# Patient Record
Sex: Female | Born: 1950 | Hispanic: No | Smoking: Never smoker
Health system: Southern US, Community
[De-identification: ages and names within clinical notes are randomized; demographics above are authoritative.]

## PROBLEM LIST (undated history)

## (undated) DIAGNOSIS — G709 Myoneural disorder, unspecified: Secondary | ICD-10-CM

## (undated) DIAGNOSIS — H538 Other visual disturbances: Secondary | ICD-10-CM

## (undated) DIAGNOSIS — E119 Type 2 diabetes mellitus without complications: Secondary | ICD-10-CM

## (undated) DIAGNOSIS — J4520 Mild intermittent asthma, uncomplicated: Secondary | ICD-10-CM

## (undated) DIAGNOSIS — E039 Hypothyroidism, unspecified: Secondary | ICD-10-CM

## (undated) DIAGNOSIS — I890 Lymphedema, not elsewhere classified: Secondary | ICD-10-CM

## (undated) DIAGNOSIS — M170 Bilateral primary osteoarthritis of knee: Secondary | ICD-10-CM

## (undated) DIAGNOSIS — I10 Essential (primary) hypertension: Secondary | ICD-10-CM

## (undated) DIAGNOSIS — Z87442 Personal history of urinary calculi: Secondary | ICD-10-CM

## (undated) DIAGNOSIS — E785 Hyperlipidemia, unspecified: Secondary | ICD-10-CM

## (undated) DIAGNOSIS — R0600 Dyspnea, unspecified: Secondary | ICD-10-CM

## (undated) HISTORY — PX: TUBAL LIGATION: SHX77

## (undated) HISTORY — PX: EYE SURGERY: SHX253

## (undated) HISTORY — PX: ABDOMINAL HYSTERECTOMY: SHX81

## (undated) HISTORY — DX: Mild intermittent asthma, uncomplicated: J45.20

## (undated) HISTORY — DX: Essential (primary) hypertension: I10

## (undated) HISTORY — DX: Hypothyroidism, unspecified: E03.9

## (undated) HISTORY — DX: Other visual disturbances: H53.8

## (undated) HISTORY — DX: Lymphedema, not elsewhere classified: I89.0

## (undated) HISTORY — DX: Type 2 diabetes mellitus without complications: E11.9

## (undated) HISTORY — DX: Bilateral primary osteoarthritis of knee: M17.0

## (undated) HISTORY — DX: Hyperlipidemia, unspecified: E78.5

---

## 2015-11-03 ENCOUNTER — Other Ambulatory Visit: Payer: Self-pay | Admitting: *Deleted

## 2015-11-03 ENCOUNTER — Encounter: Payer: Self-pay | Admitting: Vascular Surgery

## 2015-11-03 DIAGNOSIS — I890 Lymphedema, not elsewhere classified: Secondary | ICD-10-CM

## 2015-12-12 ENCOUNTER — Encounter: Payer: Self-pay | Admitting: Vascular Surgery

## 2015-12-16 ENCOUNTER — Ambulatory Visit (HOSPITAL_COMMUNITY)
Admission: RE | Admit: 2015-12-16 | Discharge: 2015-12-16 | Disposition: A | Discharge status: Home / Self Care | Payer: BLUE CROSS/BLUE SHIELD | Source: Ambulatory Visit | Attending: Vascular Surgery | Admitting: Vascular Surgery

## 2015-12-16 ENCOUNTER — Encounter: Payer: Self-pay | Admitting: Vascular Surgery

## 2015-12-16 ENCOUNTER — Ambulatory Visit (INDEPENDENT_AMBULATORY_CARE_PROVIDER_SITE_OTHER): Payer: BLUE CROSS/BLUE SHIELD | Admitting: Vascular Surgery

## 2015-12-16 DIAGNOSIS — I10 Essential (primary) hypertension: Secondary | ICD-10-CM | POA: Diagnosis not present

## 2015-12-16 DIAGNOSIS — I890 Lymphedema, not elsewhere classified: Secondary | ICD-10-CM | POA: Diagnosis not present

## 2015-12-16 DIAGNOSIS — R609 Edema, unspecified: Secondary | ICD-10-CM | POA: Diagnosis present

## 2015-12-16 DIAGNOSIS — E785 Hyperlipidemia, unspecified: Secondary | ICD-10-CM | POA: Diagnosis not present

## 2015-12-16 DIAGNOSIS — I83899 Varicose veins of unspecified lower extremities with other complications: Secondary | ICD-10-CM | POA: Insufficient documentation

## 2015-12-16 DIAGNOSIS — I872 Venous insufficiency (chronic) (peripheral): Secondary | ICD-10-CM | POA: Insufficient documentation

## 2015-12-16 NOTE — Progress Notes (Signed)
Referred by:  Cain Saupe, MD [address] [place], [place] [address]  Reason for referral: lymphedema   History of Present Illness  Jennifer Huffman is a 65 y.o. (28-May-1951) female who presents with chief complaint: lymphedema.  This patient has had known history of lymphedema for >5 years.  She recently transferred down from [place] and is here today to be established for management of her lymphedema.  She has had only one episode of cellulitis: in right leg.  She denies any wounds currently.   Past Medical History  Diagnosis Date  . Hypertension   . Hyperlipidemia   . Lymphedema of both lower extremities   . Osteoarthritis of both knees   . Asthma, mild intermittent   . Hypothyroidism   . Blurred vision, bilateral     Past Surgical History: denies any procedures  Social History   Social History  . Marital Status: Legally Separated    Spouse Name: N/A  . Number of Children: N/A  . Years of Education: N/A   Occupational History  . Not on file.   Social History Main Topics  . Smoking status: Never Smoker   . Smokeless tobacco: Never Used  . Alcohol Use: No  . Drug Use: No  . Sexual Activity: Not on file   Other Topics Concern  . Not on file   Social History Narrative    Family History  Problem Relation Age of Onset  . Cancer Mother     breast  . Hypertension Father     Current Outpatient Prescriptions  Medication Sig Dispense Refill  . albuterol (PROVENTIL HFA;VENTOLIN HFA) 108 (90 Base) MCG/ACT inhaler Inhale 2 puffs into the lungs every 4 (four) hours as needed for wheezing or shortness of breath.    . AMLODIPINE BESYLATE PO Take 5 mg by mouth daily.    Marland Kitchen aspirin 81 MG tablet Take 81 mg by mouth daily.    Marland Kitchen atorvastatin (LIPITOR) 10 MG tablet Take 10 mg by mouth daily.    . Fluticasone-Salmeterol (ADVAIR) 250-50 MCG/DOSE AEPB Inhale 1 puff into the lungs 2 (two) times daily.    . furosemide (LASIX) 40 MG tablet Take 40 mg by mouth daily.      Marland Kitchen LEVOTHYROXINE SODIUM PO Take 150 mcg by mouth daily.    . Multiple Vitamins-Minerals (MULTIVITAMIN WITH MINERALS) tablet Take 1 tablet by mouth daily.    . potassium chloride (KLOR-CON 10) 10 MEQ tablet Take 10 mEq by mouth 2 (two) times daily.    Marland Kitchen telmisartan (MICARDIS) 80 MG tablet Take 80 mg by mouth daily.    . traMADol (ULTRAM) 50 MG tablet Take 50 mg by mouth every 6 (six) hours as needed.     No current facility-administered medications for this visit.    No Known Allergies   REVIEW OF SYSTEMS:  (Positives checked otherwise negative)  CARDIOVASCULAR:    chest pain,   chest pressure,   palpitations,   shortness of breath when laying flat,   shortness of breath with exertion,    pain in feet when walking,   pain in feet when laying flat,  history of blood clot in veins (DVT),   history of phlebitis,   swelling in legs,   varicose veins  PULMONARY:    productive cough,   asthma,   wheezing  NEUROLOGIC:    weakness in arms or legs,   numbness in arms or legs,    difficulty speaking or slurred speech,  [ ]  temporary loss of vision in one eye,  [ ]  dizziness  HEMATOLOGIC:   [ ]  bleeding problems,  [ ]  problems with blood clotting too easily  MUSCULOSKEL:   [ ]  joint pain, [ ]  joint swelling  GASTROINTEST:   [ ]  vomiting blood,  [ ]  blood in stool     GENITOURINARY:   [ ]  burning with urination,  [ ]  blood in urine  PSYCHIATRIC:   [ ]  history of major depression  INTEGUMENTARY:   [x]  rashes,  [ ]  ulcers  CONSTITUTIONAL:   [ ]  fever,  [ ]  chills   Physical Examination  Filed Vitals:   12/16/15 1421 12/16/15 1432  BP: 163/63 165/60  Pulse: 59   Height: 5\' 3"  (1.6 m)   Weight: 320 lb 1.6 oz (145.196 kg)   SpO2: 96%    Body mass index is 56.72 kg/(m^2).  General: A&O x 3, WD, morbidly obese  Head: [place]/AT  Ear/Nose/Throat: Hearing grossly intact, nares without erythema or drainage,  oropharynx without Erythema/Exudate, Mallampati score: 3  Eyes: PERRLA, EOMI  Neck: Supple, no nuchal rigidity, no palpable LAD  Pulmonary: Sym exp, good air movt, CTAB, no rales, rhonchi, & wheezing  Cardiac: RRR, Nl S1, S2, no Murmurs, rubs or gallops  Vascular: Vessel Right Left  Radial Faintly Palpable Faintly Palpable  Brachial Palpable Palpable  Carotid Palpable, without bruit Palpable, without bruit  Aorta Not palpable due to pannus N/A  Femoral Not palpable due to pannus Not palpable due to pannus  Popliteal Not palpable Not palpable  PT Not Palpable Not Palpable  DP Not Palpable Not Palpable   Gastrointestinal: soft, NTND, no G/R, no HSM, no masses, no CVAT B  Musculoskeletal: M/S 5/5 throughout , Extremities without ischemic changes , no LDS, lipedema fat distribution, no woody edema, somewhat echymotic skin in right calf skin fold  Neurologic: CN 2-12 intact , Pain and light touch intact in extremities , Motor exam as listed above  Psychiatric: Judgment intact, Mood & affect appropriate for pt's clinical situation  Dermatologic: See M/S exam for extremity exam, no rashes otherwise noted  Lymph : No Cervical, Axillary, or Inguinal lymphadenopathy    Non-Invasive Vascular Imaging  BLE Venous Insufficiency Duplex (Date: 12/16/2015):   Essentially non-diagnostic due to body habitus   Outside Studies/Documentation 5 pages of outside documents were reviewed including: PCP chart.   Medical Decision Making  Jennifer Huffman is a 65 y.o. female who presents with: lipedema with superimposed with reportedly lymphedema   Pt's legs do not demonstrate sequalae from long-term lymphedema, so either she does not have lymphedema or she has very well controlled lymphedema.  I don't think lymphoscintigraphy is indicated to make a formal diagnosis.   Based on the patient's history and examination, I recommend: referral to Lymphedema Clinic for continued lymphatic massage,  sequential compression therapy, and compression.  Thank you for allowing us to participate in this patient's care.   Leonides SakeBrian Thailan Sava, MD [facility] Office: [phone] Pager: [phone]  12/16/2015, 8:51 AM

## 2016-02-20 ENCOUNTER — Ambulatory Visit (INDEPENDENT_AMBULATORY_CARE_PROVIDER_SITE_OTHER): Payer: BLUE CROSS/BLUE SHIELD | Admitting: Internal Medicine

## 2016-02-20 ENCOUNTER — Encounter: Payer: Self-pay | Admitting: Internal Medicine

## 2016-02-20 ENCOUNTER — Other Ambulatory Visit (INDEPENDENT_AMBULATORY_CARE_PROVIDER_SITE_OTHER): Payer: BLUE CROSS/BLUE SHIELD | Admitting: *Deleted

## 2016-02-20 VITALS — BP 124/80 | HR 90 | Ht 61.0 in | Wt 323.4 lb

## 2016-02-20 DIAGNOSIS — R7303 Prediabetes: Secondary | ICD-10-CM

## 2016-02-20 LAB — POCT GLYCOSYLATED HEMOGLOBIN (HGB A1C): Hemoglobin A1C: 6.3

## 2016-02-20 NOTE — Progress Notes (Signed)
Patient ID: Jennifer Huffman, female   DOB: 23-Aug-1951, 65 y.o.   MRN: [id number]  HPI: Jennifer Huffman is a 65 y.o.-year-old female, referred by her PCP, Dr. Jillyn HiddenFulp, for management of prediabetes, dx in ~2014, diet-controlled uncontrolled, without complications. She moved from [place] in 08/2015.  Last hemoglobin A1c was: 10/31/2015: HbA1c 6.5%  Pt is not on meds for her prediabetes.  Pt does not check her sugars at home.  Glucometer: none  Pt's meals are: - Brunch: bacon + eggs + grits, eggs + sausage - Dinner: Cong[place]inese foods or meat (fried or baked) + veggies + starch  - Snacks: 0-1, after dinner >> icecream or apple or potato chips Diet sodas - 1x a day, or water + Coolaid  - no CKD, last BUN/creatinine:  10/31/2015: 16/0.87, Glucose 113 She is on telmisartan 80 mg daily. - last set of lipids: 10/31/2015: 175/82/50/104 She is on atorvastatin 10 mg daily. - last eye exam was "long time ago". No DR.  - no numbness and tingling in her feet. She takes an aspirin 81 mg daily  Pt has FH of prediabetes: sisters.  She has a history of hyperlipidemia, hypertension, lymphedema, osteoarthritis of knees, mild intermittent asthma, eczema.  She also has a history of hypothyroidism. She is on levothyroxine 150 g daily. Last TSH level: 10/31/2015: TSH 4.36 (0.34-4.5)  ROS: Constitutional: + weight gain, no fatigue, + hot flushes, + poor sleep Eyes: no blurry vision, no xerophthalmia ENT: no sore throat, no nodules palpated in throat, no dysphagia/odynophagia, no hoarseness Cardiovascular: no CP/+ SOB/no palpitations/+ leg swelling Respiratory: + cough/+ SOB/+ wheezing Gastrointestinal: no N/V/D/C Musculoskeletal: no muscle/+ joint aches Skin: + rash, + easy bruising, + itching Neurological: no tremors/numbness/tingling/dizziness, + HA Psychiatric: no depression/anxiety  Past Medical History  Diagnosis Date  . Hypertension   . Hyperlipidemia   . Lymphedema of both lower  extremities   . Osteoarthritis of both knees   . Asthma, mild intermittent   . Hypothyroidism   . Blurred vision, bilateral    No past surgical history on file. Social History   Social History  . Marital Status: Legally Separated    Spouse Name: N/A  . Number of Children: 2   Occupational History  . retired   Social History Main Topics  . Smoking status: Never Smoker   . Smokeless tobacco: Never Used  . Alcohol Use: Vodka 1-2 drinks socially  . Drug Use: No   Current Outpatient Prescriptions on File Prior to Visit  Medication Sig Dispense Refill  . albuterol (PROVENTIL HFA;VENTOLIN HFA) 108 (90 Base) MCG/ACT inhaler Inhale 2 puffs into the lungs every 4 (four) hours as needed for wheezing or shortness of breath.    . AMLODIPINE BESYLATE PO Take 5 mg by mouth daily.    Marland Kitchen. aspirin 81 MG tablet Take 81 mg by mouth daily.    . Fluticasone-Salmeterol (ADVAIR) 250-50 MCG/DOSE AEPB Inhale 1 puff into the lungs 2 (two) times daily.    . furosemide (LASIX) 40 MG tablet Take 40 mg by mouth daily.    Marland Kitchen. LEVOTHYROXINE SODIUM PO Take 150 mcg by mouth daily.    . Multiple Vitamins-Minerals (MULTIVITAMIN WITH MINERALS) tablet Take 1 tablet by mouth daily.    . potassium chloride (KLOR-CON 10) 10 MEQ tablet Take 10 mEq by mouth 2 (two) times daily.    Marland Kitchen. telmisartan (MICARDIS) 80 MG tablet Take 80 mg by mouth daily.    . traMADol (ULTRAM) 50 MG tablet Take 50 mg by mouth every  6 (six) hours as needed.     No current facility-administered medications on file prior to visit.   No Known Allergies Family History  Problem Relation Age of Onset  . Cancer Mother     breast  . Hypertension Father    PE: BP 124/80 mmHg  Pulse 90  Ht  (1.549 m)  Wt 323 lb 6.4 oz (146.693 kg)  BMI 61.14 kg/m2  SpO2 95% Wt Readings from Last 3 Encounters:  02/20/16 323 lb 6.4 oz (146.693 kg)  12/16/15 320 lb 1.6 oz (145.196 kg)   Constitutional: Obese, in NAD Eyes: PERRLA, EOMI, no exophthalmos ENT:  moist mucous membranes, no thyromegaly, no cervical lymphadenopathy Cardiovascular: RRR, No MRG, + lower extremity lymphedema Respiratory: CTA B Gastrointestinal: abdomen soft, NT, ND, BS+ Musculoskeletal: no deformities, strength intact in all 4 Skin: moist, warm, no rashes Neurological: no tremor with outstretched hands, DTR normal in all 4  ASSESSMENT: 1. Prediabetes  PLAN:  1. Patient with history of prediabetes, with a recent HbA1c in the low diabetic range, at 6.5%. We checked a new HbA1c today >> 6.3%. Will still consider her prediabetic. - we discussed about prediabetes vs diabetes dx - we discussed about possible complications and HbA1c targets - we also discussed about the importance of diet and staying active. I advised her that a weight loss of 5-8% would be great to reverse the metabolic dysfunction. I will refer her to nutrition.  - no other tx needed for now - I suggested to:  Patient Instructions  Please schedule an appt with Oran Rein with nutrition.  Please work on diet and increasing activity and start checking sugars once a day. Bring sugars at next visit.   Please come back for a follow-up appointment in 3 months with your log.  - Strongly advised her to start checking sugars at different times of the day - check once a day, rotating checks - given her a meter and demonstrated use - given sugar log and advised how to fill it and to bring it at next appt  - given foot care handout and explained the principles  - given instructions for hypoglycemia management "15-15 rule"  - advised for yearly eye exams  >. She will need to establish care in [place] - Return to clinic in 3 mo with sugar log

## 2016-02-20 NOTE — Patient Instructions (Signed)
Please schedule an appt with LaurOran Reinwith nutrition.  Please work on diet and increasing activity and start checking sugars once a day. Bring sugars at next visit.   Please come back for a follow-up appointment in 3 months with your log.  PATIENT INSTRUCTIONS FOR TYPE 2 DIABETES OR PREDIABETES:  **Please join MyChart!** - see attached instructions about how to join if you have not done so already.  DIET AND EXERCISE Diet and exercise is an important part of diabetic treatment.  We recommended aerobic exercise in the form of brisk walking (working between 40-60% of maximal aerobic capacity, similar to brisk walking) for 150 minutes per week (such as 30 minutes five days per week) along with 3 times per week performing 'resistance' training (using various gauge rubber tubes with handles) 5-10 exercises involving the major muscle groups (upper body, lower body and core) performing 10-15 repetitions (or near fatigue) each exercise. Start at half the above goal but build slowly to reach the above goals. If limited by weight, joint pain, or disability, we recommend daily walking in a swimming pool with water up to waist to reduce pressure from joints while allow for adequate exercise.    BLOOD GLUCOSES Monitoring your blood glucoses is important for continued management of your diabetes. Please check your blood glucoses 2-4 times a day: fasting, before meals and at bedtime (you can rotate these measurements - e.g. one day check before the 3 meals, the next day check before 2 of the meals and before bedtime, etc.).   HYPOGLYCEMIA (low blood sugar) Hypoglycemia is usually a reaction to not eating, exercising, or taking too much insulin/ other diabetes drugs.  Symptoms include tremors, sweating, hunger, confusion, headache, etc. Treat IMMEDIATELY with 15 grams of Carbs: . 4 glucose tablets .  cup regular juice/soda . 2 tablespoons raisins . 4 teaspoons sugar . 1 tablespoon honey Recheck blood  glucose in 15 mins and repeat above if still symptomatic/blood glucose <100.  RECOMMENDATIONS TO REDUCE YOUR RISK OF DIABETIC COMPLICATIONS: * Take your prescribed MEDICATION(S) * Follow a DIABETIC diet: Complex carbs, fiber rich foods, (monounsaturated and polyunsaturated) fats * AVOID saturated/trans fats, high fat foods, >2,300 mg salt per day. * EXERCISE at least 5 times a week for 30 minutes or preferably daily.  * DO NOT SMOKE OR DRINK more than 1 drink a day. * Check your FEET every day. Do not wear tightfitting shoes. Contact us if you develop an ulcer * See your EYE doctor once a year or more if needed * Get a FLU shot once a year * Get a PNEUMONIA vaccine once before and once after age 57 years  GOALS:  * Your Hemoglobin A1c of <7%  * fasting sugars need to be <130 * after meals sugars need to be <180 (2h after you start eating) * Your Systolic BP should be 140 or lower  * Your Diastolic BP should be 80 or lower  * Your HDL (Good Cholesterol) should be 40 or higher  * Your LDL (Bad Cholesterol) should be 100 or lower. * Your Triglycerides should be 150 or lower  * Your Urine microalbumin (kidney function) should be <30 * Your Body Mass Index should be 25 or lower    Please consider the following ways to cut down carbs and fat and increase fiber and micronutrients in your diet: - substitute whole grain for white bread or pasta - substitute brown rice for white rice - substitute 90-calorie flat bread pieces for slices  of bread when possible - substitute sweet potatoes or yams for white potatoes - substitute humus for margarine - substitute tofu for cheese when possible - substitute almond or rice milk for regular milk (would not drink soy milk daily due to concern for soy estrogen influence on breast cancer risk) - substitute dark chocolate for other sweets when possible - substitute water - can add lemon or orange slices for taste - for diet sodas (artificial sweeteners  will trick your body that you can eat sweets without getting calories and will lead you to overeating and weight gain in the long run) - do not skip breakfast or other meals (this will slow down the metabolism and will result in more weight gain over time)  - can try smoothies made from fruit and almond/rice milk in am instead of regular breakfast - can also try old-fashioned (not instant) oatmeal made with almond/rice milk in am - order the dressing on the side when eating salad at a restaurant (pour less than half of the dressing on the salad) - eat as little meat as possible - can try juicing, but should not forget that juicing will get rid of the fiber, so would alternate with eating raw veg./fruits or drinking smoothies - use as little oil as possible, even when using olive oil - can dress a salad with a mix of balsamic vinegar and lemon juice, for e.g. - use agave nectar, stevia sugar, or regular sugar rather than artificial sweateners - steam or broil/roast veggies  - snack on veggies/fruit/nuts (unsalted, preferably) when possible, rather than processed foods - reduce or eliminate aspartame in diet (it is in diet sodas, chewing gum, etc) Read the labels!  Try to read Dr. Katherina RightNeal Barnard's book: "Program for Reversing Diabetes" for other ideas for healthy eating.

## 2016-04-12 ENCOUNTER — Encounter: Payer: BLUE CROSS/BLUE SHIELD | Admitting: Dietician

## 2016-04-27 ENCOUNTER — Ambulatory Visit (HOSPITAL_COMMUNITY)
Admission: EM | Admit: 2016-04-27 | Discharge: 2016-04-27 | Disposition: A | Discharge status: Home / Self Care | Payer: BLUE CROSS/BLUE SHIELD | Attending: Emergency Medicine | Admitting: Emergency Medicine

## 2016-04-27 ENCOUNTER — Encounter (HOSPITAL_COMMUNITY): Payer: Self-pay | Admitting: Emergency Medicine

## 2016-04-27 DIAGNOSIS — M545 Low back pain, unspecified: Secondary | ICD-10-CM

## 2016-04-27 LAB — POCT URINALYSIS DIP (DEVICE)
BILIRUBIN URINE: NEGATIVE
Glucose, UA: NEGATIVE mg/dL
Hgb urine dipstick: NEGATIVE
KETONES UR: NEGATIVE mg/dL
Leukocytes, UA: NEGATIVE
Nitrite: NEGATIVE
PH: 6 (ref 5.0–8.0)
Protein, ur: NEGATIVE mg/dL
Specific Gravity, Urine: <=1.005 (ref 1.005–1.030)
Urobilinogen, UA: 0.2 mg/dL (ref 0.0–1.0)

## 2016-04-27 MED ORDER — TRAMADOL HCL 50 MG PO TABS: 50.0000 mg | ORAL_TABLET | Freq: Four times a day (QID) | ORAL | 0 refills | Status: DC | PRN

## 2016-04-27 NOTE — Discharge Instructions (Signed)
Take the NSAID on a regular basis for the next 10 days. tramadol for severe pain only. many people find gentle stretching and deep tissue massage helpful. Try [facility] on [address]. They have very reasonable prices and take walk ins. Or you can go to  [facility]. Follow-up with your primary care physician in several days, go to the ER for the signs and symptoms we discussed. °

## 2016-04-27 NOTE — ED Triage Notes (Signed)
C/o right side back pain which started 2 hours ago States she is urinating frequently States she has a bitter taste in her mouth  Mouth is dry

## 2016-04-27 NOTE — ED Provider Notes (Signed)
HPI  SUBJECTIVE:  Jennifer Huffman is a 65 y.o. female who presents with  intermittent right-sided mid back pain that she describes as stabbing, lasting several minutes and then resolving. States this started this morning, several hours prior to arrival. It does not radiate down her flank or to her abdomen. She was started on antibiotics for UTI yesterday. She has tried tramadol for this which have helped her symptoms. Symptoms are worse with large positional changes. She denies trauma to her back, nausea, vomiting, fevers, abdominal pain, dysuria, urinary urgency, frequency, cloudy or odorous urine, hematuria. No urinary or fecal incontinence. No syncope. Denies any radicular pain, saddle anesthesia, weakness in her right leg. She reports some lower abdominal pain/pressure with urinating which she has had for the past 10 days, which she states is getting better today after starting the antibiotics yesterday. She has a past medical history of obesity, osteo- arthritis, hypertension, UTI, borderline diabetes, hypothyroidism, asthma. No history of nephrolithiasis pyelonephritis, gallbladder disease, cancer, multiple myeloma. Family history negative for nephrolithiasis.     Past Medical History:  Diagnosis Date  . Asthma, mild intermittent   . Blurred vision, bilateral   . Hyperlipidemia   . Hypertension   . Hypothyroidism   . Lymphedema of both lower extremities   . Osteoarthritis of both knees     History reviewed. No pertinent surgical history.  Family History  Problem Relation Age of Onset  . Cancer Mother     breast  . Hypertension Father     Social History  Substance Use Topics  . Smoking status: Never Smoker  . Smokeless tobacco: Never Used  . Alcohol use No    No current facility-administered medications for this encounter.   Current Outpatient Prescriptions:  .  albuterol (PROVENTIL HFA;VENTOLIN HFA) 108 (90 Base) MCG/ACT inhaler, Inhale 2 puffs into the lungs every 4  (four) hours as needed for wheezing or shortness of breath., Disp: , Rfl:  .  AMLODIPINE BESYLATE PO, Take 5 mg by mouth daily., Disp: , Rfl:  .  aspirin 81 MG tablet, Take 81 mg by mouth daily., Disp: , Rfl:  .  Fluticasone-Salmeterol (ADVAIR) 250-50 MCG/DOSE AEPB, Inhale 1 puff into the lungs 2 (two) times daily., Disp: , Rfl:  .  furosemide (LASIX) 40 MG tablet, Take 40 mg by mouth daily., Disp: , Rfl:  .  LEVOTHYROXINE SODIUM PO, Take 150 mcg by mouth daily., Disp: , Rfl:  .  Multiple Vitamins-Minerals (MULTIVITAMIN WITH MINERALS) tablet, Take 1 tablet by mouth daily., Disp: , Rfl:  .  potassium chloride (KLOR-CON 10) 10 MEQ tablet, Take 10 mEq by mouth 2 (two) times daily., Disp: , Rfl:  .  telmisartan (MICARDIS) 80 MG tablet, Take 80 mg by mouth daily., Disp: , Rfl:  .  traMADol (ULTRAM) 50 MG tablet, Take 1 tablet (50 mg total) by mouth every 6 (six) hours as needed., Disp: 30 tablet, Rfl: 0  No Known Allergies   ROS  As noted in HPI.   Physical Exam  BP 158/94 (BP Location: Left Wrist)   Pulse 72   Temp 98.2 F (36.8 C) (Oral)   SpO2 95%   Constitutional: Well developed, well nourished, no acute distress Eyes:  EOMI, conjunctiva normal bilaterally HENT: Normocephalic, atraumatic,mucus membranes moist Respiratory: Normal inspiratory effort Cardiovascular: Normal rate GI: nondistended. No suprapubic tenderness skin: No rash, skin intact Musculoskeletal: no CVAT. + R paralumbar tenderness,  - muscle spasm. No bony tenderness. Bilateral lower extremities nontender without new rashes or color  change, baseline ROM. Pain aggravated with active right hip flexion against resistance, torso rotation, bending forward.  Sensation baseline light touch bilaterally for Pt, DTR's symmetric and intact bilaterally KJ, Motor symmetric bilateral 5/5 hip flexion, quadriceps, hamstrings, EHL, foot dorsiflexion, foot plantarflexion, gait without apparent new ataxia. Neurologic: Alert & oriented x  3, no focal neuro deficits Psychiatric: Speech and behavior appropriate   ED Course   Medications - No data to display  Orders Placed This Encounter  Procedures  . POCT urinalysis dip (device)    Standing Status:   Standing    Number of Occurrences:   1    Results for orders placed or performed during the hospital encounter of 04/27/16 (from the past 24 hour(s))  POCT urinalysis dip (device)     Status: None   Collection Time: 04/27/16 10:44 AM  Result Value Ref Range   Glucose, UA NEGATIVE NEGATIVE mg/dL   Bilirubin Urine NEGATIVE NEGATIVE   Ketones, ur NEGATIVE NEGATIVE mg/dL   Specific Gravity, Urine <=1.005 1.005 - 1.030   Hgb urine dipstick NEGATIVE NEGATIVE   pH 6.0 5.0 - 8.0   Protein, ur NEGATIVE NEGATIVE mg/dL   Urobilinogen, UA 0.2 0.0 - 1.0 mg/dL   Nitrite NEGATIVE NEGATIVE   Leukocytes, UA NEGATIVE NEGATIVE   No results found.  ED Clinical Impression  Right-sided low back pain without sciatica   ED Assessment/Plan  [place] narcotic database reviewed. Pt with no narcotic rx since 5/23 when she was prescribed tramadol 50 mg #60, 15 day supply.   UA negative for UTI, hemoglobin, ketones.  No evidence of spinal cord involvement based on H&P. Pain present since this am,  been < 6 week duration. No historical red flags as noted in HPI. No physical red flags such as fever, bony tenderness, lower extremity weakness, saddle anesthesia. Imaging not indicated at this time.   Presentation most consistent with musculoskeletal back pain. She is very deconditioned.  She has no hematuria, doubt nephrolithiasis. She has no CVAT, flank tenderness, suprapubic tenderness and her urine is normal today, so doubt pyelonephritis. No gallbladder tenderness, doubt gallbladder disease. She is already on antibiotics for UTI, advised her to continue these. Doubt AAA.  Patient states that she has plenty of Aleve at home and does not need a prescription, advised 2 tabs twice a day. We'll refill  tramadol. Advised deep tissue massage, follow-up with PMD as needed.   Discussed labs,  MDM, plan and followup with patient . Discussed sn/sx that should prompt return to the  ED. Patient  agrees with plan.   *This clinic note was created using Dragon dictation software. Therefore, there may be occasional mistakes despite careful proofreading.  ?    Melynda Ripple, MD 04/27/16 2140

## 2016-05-15 ENCOUNTER — Encounter: Payer: BLUE CROSS/BLUE SHIELD | Attending: Internal Medicine | Admitting: Dietician

## 2016-05-15 ENCOUNTER — Encounter: Payer: Self-pay | Admitting: Dietician

## 2016-05-15 DIAGNOSIS — R7303 Prediabetes: Secondary | ICD-10-CM | POA: Diagnosis not present

## 2016-05-15 DIAGNOSIS — Z713 Dietary counseling and surveillance: Secondary | ICD-10-CM | POA: Insufficient documentation

## 2016-05-15 NOTE — Patient Instructions (Addendum)
Enquire about [facility] Sne[facility] program at the [facility] [phone]) or check the pool at the [facility] [phone]). Start by avoiding adding extra dressing/mayo with meals.  Choose mindfully, eat slowly away from the TV, stop when you are satisfied. Work at identifying when you are eating because of stress, boredom, depression etc.  Plan:  Aim for 2-3 Carb Choices per meal (30-45 grams) +/- 1 either way  Aim for 0-1 Carbs per snack if hungry  Include protein in moderation with your meals and snacks Consider reading food labels for Total Carbohydrate and Fat Grams of foods Consider  increasing your activity level by water exercise or armchair exercise for 15 minutes daily as tolerated

## 2016-05-15 NOTE — Progress Notes (Signed)
  Medical Nutrition Therapy:  Appt start time: 1400 end time:  1515.   Assessment:  Primary concerns today: Patient is here today with a friend and would like to learn how to lose weight.  Referral for prediabetes.  History includes hyperlipidemia HTN, and hypothyroidism.  Her last HgbA1C was 6.3%.  She has lymphedema in both legs.  She also has knee problems but does not qualify for surgery due to the lymphedema.    Weight hx: Lowest adult weight 185 lbs > 3 years ago and was getting shots and diet pills for weight loss during that time.  But she stopped going to the MD for this due to the expense. Highest adult weight 322 lbs which is today's weight.  Patient lives with her friend that is present today and her sister.  Her friend shops and cooks.  Retired from [employer] and moved from [place].   Preferred Learning Style:   No preference indicated   Learning Readiness:   Ready  MEDICATIONS: see list   DIETARY INTAKE:  Usual eating pattern includes 2 meals and 2 snacks per day.  24-hr recall:  B (9 AM): smoked Malawiturkey and mayo sandwich on white OR frosted flakes and whole milk OR bacon, eggs, sausage, grits, toast 3 times per week. Snk ( AM): granola bar when she has them and coffee with cream and 3 heaping teaspoons of sugar and fruit L ( PM): none Snk ( PM): none D (7 PM): hamburgers without bread, cabbage OR chicken wings (baked or fried) and vegetable Snk ( PM): none or ice cream Beverages: diet pepsi, water with crystal lite  Usual physical activity: ADL's  Estimated energy needs: 1800  calories 200 g carbohydrates 135 g protein 50 g fat  Progress Towards Goal(s):  In progress.   Nutritional Diagnosis:  NB-1.1 Food and nutrition-related knowledge deficit As related to balance of carbohydrates, protein, and fat.  As evidenced by diet hx and patient report.    Intervention:  Nutrition counseling and diabetes education initiated. Discussed Carb Counting by food  group as method of portion control, reading food labels, and benefits of increased activity. Also discussed basic physiology of Diabetes, target BG ranges pre and post meals, and A1c. Discussed changing cooking techniques, and basic meal planning.  Discussed emotional eating.  Enquire about [facility] S[facility] at [facility] [phone]) or check the pool at the [facility] [phone]). Start by avoiding adding extra dressing/mayo with meals.  Choose mindfully, eat slowly away from the TV, stop when you are satisfied. Work at identifying when you are eating because of stress, boredom, depression etc.  Plan:  Aim for 2-3 Carb Choices per meal (30-45 grams) +/- 1 either way  Aim for 0-1 Carbs per snack if hungry  Include protein in moderation with your meals and snacks Consider reading food labels for Total Carbohydrate and Fat Grams of foods Consider  increasing your activity level by water exercise or armchair exercise for 15 minutes daily as tolerated  Teaching Method Utilized:  Visual Auditory Hands on  Handouts given during visit include: Living Well with Diabetes Carb Counting and Food Label handouts Meal Plan Card  Snack list  Label reading  A1C sheet  Barriers to learning/adherence to lifestyle change: physical problems make exercise difficult  Demonstrated degree of understanding via:  Teach Back   Monitoring/Evaluation:  Dietary intake, exercise, label reading, and body weight prn.

## 2016-05-22 ENCOUNTER — Encounter: Payer: Self-pay | Admitting: Internal Medicine

## 2016-05-22 ENCOUNTER — Other Ambulatory Visit: Payer: Self-pay

## 2016-05-22 ENCOUNTER — Ambulatory Visit (INDEPENDENT_AMBULATORY_CARE_PROVIDER_SITE_OTHER): Payer: BLUE CROSS/BLUE SHIELD | Admitting: Internal Medicine

## 2016-05-22 VITALS — BP 120/74 | HR 71 | Ht 60.5 in | Wt 319.0 lb

## 2016-05-22 DIAGNOSIS — R7303 Prediabetes: Secondary | ICD-10-CM | POA: Diagnosis not present

## 2016-05-22 LAB — POCT GLYCOSYLATED HEMOGLOBIN (HGB A1C): HEMOGLOBIN A1C: 6.3

## 2016-05-22 MED ORDER — BAYER MICROLET LANCETS MISC: 5 refills | Status: AC

## 2016-05-22 MED ORDER — GLUCOSE BLOOD VI STRP: ORAL_STRIP | 5 refills | Status: DC

## 2016-05-22 NOTE — Patient Instructions (Signed)
Patient Instructions  Please continue to work on diet and increasing activity and start checking sugars once a day. Bring sugars at next visit.   Please come back for a follow-up appointment in 3 months with your log.

## 2016-05-22 NOTE — Progress Notes (Signed)
Patient ID: Jennifer Huffman, female   DOB: Mar 02, 1951, 65 y.o.   MRN: [id number]  HPI: Jennifer Huffman is a 65 y.o.-year-old female, returning for f/u for prediabetes, dx in ~2014, diet-controlled uncontrolled, without complications. [place] moved from [place] in 08/2015. Last visit 3 mo ago.  Last hemoglobin A1c was: Lab Results  Component Value Date   HGBA1C 6.3 02/20/2016  10/31/2015: HbA1c 6.5%  Pt is not on meds for her prediabetes.  Pt did started to check sugars at home - only checked 2-3: 172, 175 - before meals.  Glucometer: [employer] Technology[employer]ayer Contour Next one - we gave her this at last visit, could   Pt's meals are: - Brunch: bacon + eggs + grits, eggs + sausage - Dinner: Congohinese foods or meat (fried or baked) + veggies + starch  - Snacks: 0-1, after dinner >> icecream or apple or potato chips Diet sodas - 1x a day, or water + Coolaid  - no CKD, last BUN/creatinine:  10/31/2015: 16/0.87, Glucose 113 She is on telmisartan 80 mg daily. - last set of lipids: 10/31/2015: 175/82/50/104 She is on atorvastatin 10 mg daily. - last eye exam was "long time ago". No DR.  - no numbness and tingling in her feet. She takes an aspirin 81 mg daily  Pt has FH of prediabetes: sisters.  She has a history of hyperlipidemia, hypertension, lymphedema, osteoarthritis of knees, mild intermittent asthma, eczema.  She also has a history of hypothyroidism. She is on levothyroxine 150 g daily. Last TSH level: 10/31/2015: TSH 4.36 (0.34-4.5)  ROS: Constitutional: + weight loss, no fatigue, + hot flushes, + poor sleep Eyes: no blurry vision, no xerophthalmia ENT: no sore throat, no nodules palpated in throat, no dysphagia/odynophagia, no hoarseness Cardiovascular: no CP/+ SOB/no palpitations/+ leg swelling Respiratory: + cough/+ SOB/+ wheezing Gastrointestinal: no N/V/D/C Musculoskeletal: no muscle/+ joint aches Skin: + rash Neurological: no tremors/numbness/tingling/dizziness  I reviewed pt's  medications, allergies, PMH, social hx, family hx, and changes were documented in the history of present illness. Otherwise, unchanged from my initial visit note.  Past Medical History:  Diagnosis Date  . Asthma, mild intermittent   . Blurred vision, bilateral   . Hyperlipidemia   . Hypertension   . Hypothyroidism   . Lymphedema of both lower extremities   . Osteoarthritis of both knees    No past surgical history on file. Social History   Social History  . Marital Status: Legally Separated    Spouse Name: N/A  . Number of Children: 2   Occupational History  . retired   Social History Main Topics  . Smoking status: Never Smoker   . Smokeless tobacco: Never Used  . Alcohol Use: Vodka 1-2 drinks socially  . Drug Use: No   Current Outpatient Prescriptions on File Prior to Visit  Medication Sig Dispense Refill  . albuterol (PROVENTIL HFA;VENTOLIN HFA) 108 (90 Base) MCG/ACT inhaler Inhale 2 puffs into the lungs every 4 (four) hours as needed for wheezing or shortness of breath.    . AMLODIPINE BESYLATE PO Take 5 mg by mouth daily.    Marland Kitchen. aspirin 81 MG tablet Take 81 mg by mouth daily.    . Fluticasone-Salmeterol (ADVAIR) 250-50 MCG/DOSE AEPB Inhale 1 puff into the lungs 2 (two) times daily.    . furosemide (LASIX) 40 MG tablet Take 40 mg by mouth daily.    Marland Kitchen. LEVOTHYROXINE SODIUM PO Take 150 mcg by mouth daily.    . Multiple Vitamins-Minerals (MULTIVITAMIN WITH MINERALS) tablet Take 1 tablet by  mouth daily.    . potassium chloride (KLOR-CON 10) 10 MEQ tablet Take 10 mEq by mouth 2 (two) times daily.    Marland Kitchen. telmisartan (MICARDIS) 80 MG tablet Take 80 mg by mouth daily.    . traMADol (ULTRAM) 50 MG tablet Take 1 tablet (50 mg total) by mouth every 6 (six) hours as needed. 30 tablet 0   No current facility-administered medications on file prior to visit.    No Known Allergies Family History  Problem Relation Age of Onset  . Cancer Mother     breast  . Hypertension Father     PE: BP 120/74 (BP Location: Right Arm, Patient Position: Sitting, Cuff Size: Large)   Pulse 71   Ht 5' 0.5" (1.537 m)   Wt (!) 319 lb (144.7 kg)   SpO2 95%   BMI 61.27 kg/m  Wt Readings from Last 3 Encounters:  05/22/16 (!) 319 lb (144.7 kg)  05/15/16 (!) 322 lb (146.1 kg)  02/20/16 (!) 323 lb 6.4 oz (146.7 kg)   Constitutional: Obese, in NAD Eyes: PERRLA, EOMI, no exophthalmos ENT: moist mucous membranes, no thyromegaly, no cervical lymphadenopathy Cardiovascular: RRR, No MRG, + lower extremity lymphedema Respiratory: CTA B Gastrointestinal: abdomen soft, NT, ND, BS+ Musculoskeletal: no deformities, strength intact in all 4 Skin: moist, warm, no rashes Neurological: no tremor with outstretched hands, DTR normal in all 4  ASSESSMENT: 1. Prediabetes  PLAN:  1. Patient with history of prediabetes, with a HbA1c in the low diabetic range, at 6.5% and at last visit, lower, 6.3% >> we considered her prediabetic. We gave her a meter at last visit Personal assistant(Bayer Contour) >> she did not start using it as she did not know how to use it (did not alert us about this)... She has 2 sugars checked by her sister >> in the 170s. - HbA1c today: 6.3% (stable) >> no need to add Metformin yet - at last visit, we discussed about the importance of diet and staying active. I advised her that a weight loss of 5-8% would be great to reverse the metabolic dysfunction. I also refered her to nutrition >> she saw Oran ReinLaura Jobe this mo. She lost 3 lbs since last visit. - I suggested to:  Patient Instructions  Please continue to work on diet and increasing activity and start checking sugars once a day. Bring sugars at next visit.   Please come back for a follow-up appointment in 3 months with your log.  - continue checking sugars at different times of the day - check 1x a day, rotating checks >> we showed her how to use her meter - advised for yearly eye exams >> needs one  - Return to clinic in 3 mo with sugar  log   Carlus Pavlovristina Candela Krul, MD PhD [facility]

## 2016-06-15 ENCOUNTER — Encounter: Payer: BLUE CROSS/BLUE SHIELD | Attending: Internal Medicine | Admitting: Dietician

## 2016-06-15 DIAGNOSIS — Z713 Dietary counseling and surveillance: Secondary | ICD-10-CM | POA: Insufficient documentation

## 2016-06-15 DIAGNOSIS — R7303 Prediabetes: Secondary | ICD-10-CM | POA: Diagnosis not present

## 2016-06-15 NOTE — Progress Notes (Signed)
Medical Nutrition Therapy:  Appt start time: 1110 end time:  1135.   Assessment:  05/15/16 Primary concerns today: Patient is here today with a friend and would like to learn how to lose weight.  Referral for prediabetes.  History includes hyperlipidemia HTN, and hypothyroidism.  Her last HgbA1C was 6.3%.  She has lymphedema in both legs.  She also has knee problems but does not qualify for surgery due to the lymphedema.    Weight hx: Lowest adult weight 185 lbs > 3 years ago and was getting shots and diet pills for weight loss during that time.  But she stopped going to the MD for this due to the expense. Highest adult weight 322 lbs which is today's weight.  Patient lives with her friend that is present today and her sister.  Her friend shops and cooks.  Retired from [employer] and moved from [place].   06/15/16 follow up: Patient is here alone.  Weight has increased to 326 lbs.  Patient states that she is constipated and had Congohinese last night and is retaining water.  Her friend that she ate with last night commented that she ate a lot less than usual.  She has been drinking more water as well.  She states that she will enquire about exercise today.  Preferred Learning Style:   No preference indicated   Learning Readiness:   Ready  MEDICATIONS: see list   DIETARY INTAKE:  Usual eating pattern includes 2 meals and 2 snacks per day.  24-hr recall:  B (9 AM): smoked Malawiturkey and mayo sandwich on white OR frosted flakes and whole milk OR bacon, eggs, sausage, grits, toast 3 times per week. Skipped today. Snk ( AM): granola bar when she has them and coffee with cream and 3 heaping teaspoons of sugar and fruit L ( PM): none Snk ( PM): none D (7 PM): Congohinese without rice Snk ( PM): none or ice cream Beverages: diet pepsi, water with crystal lite  Usual physical activity: ADL's  Estimated energy needs: 1800  calories 200 g carbohydrates 135 g protein 50 g fat  Progress  Towards Goal(s):  In progress.   Nutritional Diagnosis:  NB-1.1 Food and nutrition-related knowledge deficit As related to balance of carbohydrates, protein, and fat.  As evidenced by diet hx and patient report.    Intervention:  Nutrition counseling and diabetes education continued.  Reviewed portion sizes, food choices, importance of activity as able, and mindful eating.  Enquire about [facility] Sneakers program at the [facility] [phone]) or check the pool at the [facility] [phone]). Continue lite mayo and watch portion. Continue whole grains. Consider decreasing bread to 1 per meal. Consider changing the granola bar to a protein bar (15 grams of carbohydrates) Continue eating away from the TV.  Eat slowly. Chew your food well. Avoid skipping meals. Watch your sodium intake.  Avoid added salt, canned foods unless they are no added salt.  Request no added salt when eating out if able.  Plan:  Aim for 2-3 Carb Choices per meal (30-45 grams) +/- 1 either way  Aim for 0-1 Carbs per snack if hungry  Include protein in moderation with your meals and snacks Consider reading food labels for Total Carbohydrate and Fat Grams of foods Consider  increasing your activity level by water exercise or armchair exercise for 15 minutes daily as tolerated  Teaching Method Utilized:  Visual Auditory Hands on  Handouts given during initial visit include: Living Well with Diabetes Carb  Counting and Food Label handouts Meal Plan Card  Snack list  Label reading  A1C sheet  Barriers to learning/adherence to lifestyle change: physical problems make exercise difficult  Demonstrated degree of understanding via:  Teach Back   Monitoring/Evaluation:  Dietary intake, exercise, label reading, and body weight in 2 months after Dr. Charlean Sanfilippo appointment.

## 2016-06-15 NOTE — Patient Instructions (Addendum)
Enquire about Silver Sneakers program at the [facility] [phone]) or check the pool at the [facility] [phone]). Continue lite mayo and watch portion. Continue whole grains. Consider decreasing bread to 1 per meal. Consider changing the granola bar to a protein bar (15 grams of carbohydrates) Continue eating away from the TV.  Eat slowly. Chew your food well. Avoid skipping meals. Watch your sodium intake.  Avoid added salt, canned foods unless they are no added salt.  Request no added salt when eating out if able.

## 2016-08-16 ENCOUNTER — Encounter: Payer: BLUE CROSS/BLUE SHIELD | Attending: Internal Medicine | Admitting: Dietician

## 2016-08-16 ENCOUNTER — Ambulatory Visit (INDEPENDENT_AMBULATORY_CARE_PROVIDER_SITE_OTHER): Payer: BLUE CROSS/BLUE SHIELD | Admitting: Internal Medicine

## 2016-08-16 ENCOUNTER — Encounter: Payer: Self-pay | Admitting: Internal Medicine

## 2016-08-16 VITALS — BP 120/80 | Wt 325.0 lb

## 2016-08-16 DIAGNOSIS — R7303 Prediabetes: Secondary | ICD-10-CM | POA: Insufficient documentation

## 2016-08-16 DIAGNOSIS — Z713 Dietary counseling and surveillance: Secondary | ICD-10-CM | POA: Diagnosis not present

## 2016-08-16 DIAGNOSIS — E119 Type 2 diabetes mellitus without complications: Secondary | ICD-10-CM

## 2016-08-16 LAB — POCT GLYCOSYLATED HEMOGLOBIN (HGB A1C): HEMOGLOBIN A1C: 6.9

## 2016-08-16 MED ORDER — METFORMIN HCL 500 MG PO TABS: 500.0000 mg | ORAL_TABLET | Freq: Two times a day (BID) | ORAL | 5 refills | Status: DC

## 2016-08-16 NOTE — Patient Instructions (Addendum)
Please continue to work on diet and increasing activity and start checking sugars once a day. Bring sugars at next visit.   Please start Metformin 500 mg with dinner. After 4 days, increase to 500 mg 2x a day, with b'fast and dinner.   Please come back for a follow-up appointment in 3 months with your log.

## 2016-08-16 NOTE — Patient Instructions (Addendum)
Enquire about [facility] Sneakers program at the [facility] [phone]) or check the pool at the [facility] [phone]). Consider some form of activity every day. (armchair exercises etc.) Continue lite mayo and watch portion. Continue whole grains. Consider decreasing bread to 1 per meal. Continue the protein bar (15 grams of carbohydrates) rather than a granola bar Consider eating away from the TV.  Eat slowly. Chew your food well. Avoid skipping meals. Watch your sodium intake.  Avoid added salt, canned foods unless they are no added salt.  Request no added salt when eating out if able. More none starchy vegetables.  Plan:  Aim for 2-3 Carb Choices per meal (30-45 grams) +/- 1 either way  Aim for 0-1 Carbs per snack if hungry  Include protein in moderation with your meals and snacks Consider reading food labels for Total Carbohydrate and Fat Grams of foods Consider  increasing your activity level by water exercise or armchair exercise for 15 minutes daily as tolerated

## 2016-08-16 NOTE — Progress Notes (Signed)
Medical Nutrition Therapy:  Appt start time: 1530 end time:  1545.   Assessment:  05/15/16 Primary concerns today: Patient is here today with a friend and would like to learn how to lose weight.  Referral for prediabetes.  History includes hyperlipidemia HTN, and hypothyroidism.  Her last HgbA1C was 6.3%.  She has lymphedema in both legs.  She also has knee problems but does not qualify for surgery due to the lymphedema.    Weight hx: Lowest adult weight 185 lbs > 3 years ago and was getting shots and diet pills for weight loss during that time.  But she stopped going to the MD for this due to the expense. Highest adult weight 322 lbs which is today's weight.  Patient lives with her friend that is present today and her sister.  Her friend shops and cooks.  Retired from [employer] and moved from [place].   06/15/16 follow up: Patient is here alone.  Weight has increased to 326 lbs.  Patient states that she is constipated and had Congohinese last night and is retaining water.  Her friend that she ate with last night commented that she ate a lot less than usual.  She has been drinking more water as well.  She states that she will enquire about exercise today.  08/17/16: Patient is here alone.  Weight is 325 lbs today which is basically unchanged.  She states that her friend has been cooking and gives watches her portion sizes.  Her diet is basically unchanged and increased fat/salt.  Still problems with the lymphedema.  She has not done anything for exercise.  "In January I will go to the gym or water aerobics."  She continues to eat in front of the TV.  She is not checking her blood sugar.  Her A1C is now 6.9% increased from 6.3% 05/22/16 and she will start on Metformin.  Preferred Learning Style:   No preference indicated   Learning Readiness:   Ready  MEDICATIONS: see list   DIETARY INTAKE:  Usual eating pattern includes 2 meals and 2 snacks per day.  24-hr recall:  B (9 AM): protein  bar and coffee with cream and sugar Snk ( AM):  none L ( PM): spam and home fries Snk ( PM): apple D (7 PM): Teryaki chicken and stir fried vegetables Snk ( PM): none Beverages: diet pepsi, water with crystal lite  Usual physical activity: ADL's  Estimated energy needs: 1800  calories 200 g carbohydrates 135 g protein 50 g fat  Progress Towards Goal(s):  In progress.   Nutritional Diagnosis:  NB-1.1 Food and nutrition-related knowledge deficit As related to balance of carbohydrates, protein, and fat.  As evidenced by diet hx and patient report.    Intervention:  Nutrition counseling and diabetes education continued.  Reviewed portion sizes, food choices, importance of activity as able, and mindful eating. Enquire about [facility] Sneakers program at the [facility] [phone]) or check the pool at the [facility] [phone]). Consider some form of activity every day. (armchair exercises etc.) Continue lite mayo and watch portion. Continue whole grains. Consider decreasing bread to 1 per meal. Continue the protein bar (15 grams of carbohydrates) rather than a granola bar Consider eating away from the TV.  Eat slowly. Chew your food well. Avoid skipping meals. Watch your sodium intake.  Avoid added salt, canned foods unless they are no added salt.  Request no added salt when eating out if able. More none starchy vegetables.  Plan:  Aim for 2-3 Carb Choices per meal (30-45 grams) +/- 1 either way  Aim for 0-1 Carbs per snack if hungry  Include protein in moderation with your meals and snacks Consider reading food labels for Total Carbohydrate and Fat Grams of foods Consider  increasing your activity level by water exercise or armchair exercise for 15 minutes daily as tolerated   Teaching Method Utilized:  Visual Auditory Hands on  Handouts given during initial visit include: Living Well with Diabetes Carb Counting and Food Label handouts Meal Plan Card  Snack  list  Label reading  A1C sheet  Barriers to learning/adherence to lifestyle change: physical problems make exercise difficult  Demonstrated degree of understanding via:  Teach Back   Monitoring/Evaluation:  Dietary intake, exercise, label reading, and body weight in 2 months after Dr. Charlean SanfilippoGherghe's appointment.

## 2016-08-16 NOTE — Progress Notes (Signed)
Patient ID: Jennifer Huffman, female   DOB: 1951-05-21, 65 y.o.   MRN: [id number]  HPI: Jennifer Huffman is a 65 y.o.-year-old female, returning for f/u for prediabetes, dx in ~2014, diet-controlled uncontrolled, without complications. She moved from [place] in 08/2015. Last visit 3 mo ago.  Last hemoglobin A1c was: Lab Results  Component Value Date   HGBA1C 6.3 05/22/2016   HGBA1C 6.3 02/20/2016  10/31/2015: HbA1c 6.5%  Pt is not on meds for her prediabetes.  Pt is still not checking sugars at home.  Glucometer: [employer] Contour Next  Pt's meals are: - Brunch: bacon + eggs + grits, eggs + sausage - Dinner: Congohinese foods or meat (fried or baked) + veggies + starch  - Snacks: 0-1, after dinner >> icecream or apple or potato chips Diet sodas - 1x a day, or water + Coolaid  - no CKD, last BUN/creatinine:  07/03/2016: 11/0.83, glucose 98 10/31/2015: 16/0.87, Glucose 113 She is on telmisartan 80 mg daily.  - last set of lipids: 10/31/2015: 175/82/50/104 She is on atorvastatin 10 mg daily.  - last eye exam was " long time ago". No DR.   - no numbness and tingling in her feet.  She takes an aspirin 81 mg daily  Pt has FH of prediabetes: sisters.  She has a history of hyperlipidemia, hypertension, lymphedema, osteoarthritis of knees, mild intermittent asthma, eczema.  She also has a history of hypothyroidism. She is on levothyroxine 150 g daily. Last TSH level: 10/31/2015: TSH 4.36 (0.34-4.5)  ROS: Constitutional: + weight loss, no fatigue, + hot flushes, + poor sleep Eyes: no blurry vision, no xerophthalmia ENT: no sore throat, no nodules palpated in throat, no dysphagia/odynophagia, no hoarseness Cardiovascular: no CP/+ SOB/no palpitations/+ leg swelling Respiratory: + cough/+ SOB/+ wheezing Gastrointestinal: no N/V/D/C Musculoskeletal: no muscle/+ joint aches Skin: + rash Neurological: no tremors/numbness/tingling/dizziness  I reviewed pt's medications,  allergies, PMH, social hx, family hx, and changes were documented in the history of present illness. Otherwise, unchanged from my initial visit note.  Past Medical History:  Diagnosis Date  . Asthma, mild intermittent   . Blurred vision, bilateral   . Hyperlipidemia   . Hypertension   . Hypothyroidism   . Lymphedema of both lower extremities   . Osteoarthritis of both knees    No past surgical history on file. Social History   Social History  . Marital Status: Legally Separated    Spouse Name: N/A  . Number of Children: 2   Occupational History  . retired   Social History Main Topics  . Smoking status: Never Smoker   . Smokeless tobacco: Never Used  . Alcohol Use: Vodka 1-2 drinks socially  . Drug Use: No   Current Outpatient Prescriptions on File Prior to Visit  Medication Sig Dispense Refill  . albuterol (PROVENTIL HFA;VENTOLIN HFA) 108 (90 Base) MCG/ACT inhaler Inhale 2 puffs into the lungs every 4 (four) hours as needed for wheezing or shortness of breath.    . AMLODIPINE BESYLATE PO Take 5 mg by mouth daily.    Marland Kitchen. aspirin 81 MG tablet Take 81 mg by mouth daily.    Marland Kitchen. BAYER MICROLET LANCETS lancets Use as instructed used to check one time a day 100 each 5  . Fluticasone-Salmeterol (ADVAIR) 250-50 MCG/DOSE AEPB Inhale 1 puff into the lungs 2 (two) times daily.    . furosemide (LASIX) 40 MG tablet Take 40 mg by mouth daily.    Marland Kitchen. glucose blood (BAYER CONTOUR NEXT TEST) test strip Use as  instructed testing one time a day. 100 each 5  . LEVOTHYROXINE SODIUM PO Take 150 mcg by mouth daily.    . Multiple Vitamins-Minerals (MULTIVITAMIN WITH MINERALS) tablet Take 1 tablet by mouth daily.    . potassium chloride (KLOR-CON 10) 10 MEQ tablet Take 10 mEq by mouth 2 (two) times daily.    Marland Kitchen. telmisartan (MICARDIS) 80 MG tablet Take 80 mg by mouth daily.    . traMADol (ULTRAM) 50 MG tablet Take 1 tablet (50 mg total) by mouth every 6 (six) hours as needed. 30 tablet 0   No current  facility-administered medications on file prior to visit.    No Known Allergies Family History  Problem Relation Age of Onset  . Cancer Mother     breast  . Hypertension Father    PE: BP 120/80   Wt (!) 325 lb (147.4 kg)   BMI 62.43 kg/m  Wt Readings from Last 3 Encounters:  08/16/16 (!) 325 lb (147.4 kg)  06/15/16 (!) 326 lb (147.9 kg)  05/22/16 (!) 319 lb (144.7 kg)   Constitutional: Obese, in NAD Eyes: PERRLA, EOMI, no exophthalmos ENT: moist mucous membranes, no thyromegaly, no cervical lymphadenopathy Cardiovascular: RRR, No MRG, + lower extremity lymphedema Respiratory: CTA B Gastrointestinal: abdomen soft, NT, ND, BS+ Musculoskeletal: no deformities, strength intact in all 4 Skin: moist, warm, no rashes Neurological: no tremor with outstretched hands, DTR normal in all 4  ASSESSMENT: 1. Prediabetes  PLAN:  1. Patient with history of prediabetes >> last HbA1c 6.3% (stable) >> we did not add Metformin, but discussed about the importance of diet and staying active. I advised her that a weight loss of 5-8% would be great to reverse the metabolic dysfunction. She also saw nutrition and sees the nutritionist again today. - HbA1c today: 6.9% (higher) >> will start half-max dose of Metformin - I suggested to:  Patient Instructions  Please continue to work on diet and increasing activity and start checking sugars once a day. Bring sugars at next visit.   Please start Metformin 500 mg with dinner. After 4 days, increase to 500 mg 2x a day, with b'fast and dinner.   Please come back for a follow-up appointment in 3 months with your log.  - advised to start checking sugars at different times of the day - check 1x a day, rotating checks >> given log  - advised for yearly eye exams >> needs one >> again advised to schedule! - Return to clinic in 3 mo with sugar log   Carlus Pavlovristina Kage Willmann, MD PhD [facility]

## 2016-11-16 ENCOUNTER — Encounter: Payer: Self-pay | Admitting: Internal Medicine

## 2016-11-16 ENCOUNTER — Encounter: Payer: Self-pay | Admitting: Dietician

## 2016-11-16 ENCOUNTER — Encounter: Payer: BLUE CROSS/BLUE SHIELD | Attending: Internal Medicine | Admitting: Dietician

## 2016-11-16 ENCOUNTER — Ambulatory Visit (INDEPENDENT_AMBULATORY_CARE_PROVIDER_SITE_OTHER): Payer: BLUE CROSS/BLUE SHIELD | Admitting: Internal Medicine

## 2016-11-16 VITALS — BP 120/82 | HR 69 | Wt 320.0 lb

## 2016-11-16 DIAGNOSIS — Z713 Dietary counseling and surveillance: Secondary | ICD-10-CM | POA: Insufficient documentation

## 2016-11-16 DIAGNOSIS — E669 Obesity, unspecified: Secondary | ICD-10-CM | POA: Diagnosis not present

## 2016-11-16 DIAGNOSIS — E039 Hypothyroidism, unspecified: Secondary | ICD-10-CM | POA: Diagnosis not present

## 2016-11-16 DIAGNOSIS — Z6841 Body Mass Index (BMI) 40.0 and over, adult: Secondary | ICD-10-CM

## 2016-11-16 DIAGNOSIS — R7303 Prediabetes: Secondary | ICD-10-CM

## 2016-11-16 DIAGNOSIS — IMO0001 Reserved for inherently not codable concepts without codable children: Secondary | ICD-10-CM

## 2016-11-16 LAB — POCT GLYCOSYLATED HEMOGLOBIN (HGB A1C): HEMOGLOBIN A1C: 6.1

## 2016-11-16 NOTE — Progress Notes (Signed)
Medical Nutrition Therapy:  Appt start time: 1530 end time:  1545.   Assessment:  05/15/16 Primary concerns today: Patient is here today with a friend and would like to learn how to lose weight.  Referral for prediabetes.  History includes hyperlipidemia HTN, and hypothyroidism.  Her last HgbA1C was 6.3%.  She has lymphedema in both legs.  She also has knee problems but does not qualify for surgery due to the lymphedema.    Weight hx: Lowest adult weight 185 lbs > 3 years ago and was getting shots and diet pills for weight loss during that time.  But she stopped going to the MD for this due to the expense. Highest adult weight 322 lbs which is today's weight.  Patient lives with her friend that is present today and her sister.  Her friend shops and cooks.  Retired from [employer] and moved from [place].   06/15/16 follow up: Patient is here alone.  Weight has increased to 326 lbs.  Patient states that she is constipated and had Congohinese last night and is retaining water.  Her friend that she ate with last night commented that she ate a lot less than usual.  She has been drinking more water as well.  She states that she will enquire about exercise today.  08/17/16: Patient is here alone.  Weight is 325 lbs today which is basically unchanged.  She states that her friend has been cooking and gives watches her portion sizes.  Her diet is basically unchanged and increased fat/salt.  Still problems with the lymphedema.  She has not done anything for exercise.  "In January I will go to the gym or water aerobics."  She continues to eat in front of the TV.  She is not checking her blood sugar.  Her A1C is now 6.9% increased from 6.3% 05/22/16 and she will start on Metformin.  11/16/16: Patient is here alone.  Her weight has decreased 5 lbs to 320 lbs but patient states that is probably because she has removed her wraps.  She continues to have problems with lymphedema.  She asked if there was a weight loss  program that she could do with shots and diet pills.  (We do not provide that here nor evidence that it works long term.) She states that she lost weight in the past using these and was able to keep the weight off for a while.  She does not want bariatric surgery as she knows people that have not been able to keep the weight off or have had complications.  She reports that she is not using salt.  Her boyfriend shops and cooks and he is not to use salt either.    She is not checking her blood sugar as she states that she cannot get the meter to work.  She now has a [employer] membership but has not gone.  She plans on going with her boyfriend and using the pool but continues to put this off.  She continues to take the Metformin.  Her A1C has decreased from 6.9% 08/2016 to 6.1% today.  Intake is poor.  She complains of a poor appetite.  Concerns that she is not getting adequate nutrition or protein.  Eats  1-2 meals per day and 1-2 snacks.  Overall quality of her diet is poor.  Preferred Learning Style:   No preference indicated   Learning Readiness:   Ready  MEDICATIONS: see list   DIETARY INTAKE:  Usual eating pattern includes  2 meals and 2 snacks per day.  24-hr recall:  B (9 AM): protein bar and coffee with cream and sugar Snk ( AM):  none L ( PM): often skips OR apple OR lunch meat sandwich OR homemade soup (without salt) Snk ( PM): ice cream sandwich occasionally D (5-6 PM): Meatloaf and cabbage, potatoes Snk ( PM): none Beverages: diet pepsi, water with crystal lite  Usual physical activity: ADL's  Estimated energy needs: 1800  calories 200 g carbohydrates 135 g protein 50 g fat  Progress Towards Goal(s):  In progress.   Nutritional Diagnosis:  NB-1.1 Food and nutrition-related knowledge deficit As related to balance of carbohydrates, protein, and fat.  As evidenced by diet hx and patient report.    Intervention:  Nutrition counseling and diabetes education continued.   Reviewed portion sizes, food choices, importance of activity as able, and mindful eating.  Also reviewed need to improve her nutritional intake, particularly protein.  Plan:  Aim for 2-3 Carb Choices per meal (30-45 grams) +/- 1 either way  Aim for 0-1 Carbs per snack if hungry  Include protein in moderation with your meals and snacks Consider reading food labels for Total Carbohydrate and Fat Grams of foods Consider  increasing your activity level by water exercise or armchair exercise for 15 minutes daily as tolerated  Breakfast, Lunch, Dinner Daily  Consider Boost Glucose Control, Glucerna, or Premier Protein shake daily. You need to improve your nutritional intake. Be sure to get enough protein every day. Continue to avoid added salt. Choose frozen vegetables, fresh vegetables or canned without salt.   Eat more non starchy vegetables. LUVO frozen meals are low in sodium. Eat slowly, chew your food well.  Stop when you are satisfied.  Try the new meter to check your blood sugars. Make a date to go to the gym 3 days per week. On days you can not go to the gym, do armchair exercises and walk across the house every hour.  Teaching Method Utilized:  Visual Auditory Hands on  Provided a new meter as patient can't get the other meter to work.  Contour Next Lot [id number] exp 06/30/17. Provided samples of [employer] and [employer].  Handouts given during initial visit include: Living Well with Diabetes Carb Counting and Food Label handouts Meal Plan Card  Snack list  Label reading  A1C sheet  Barriers to learning/adherence to lifestyle change: physical problems make exercise difficult  Demonstrated degree of understanding via:  Teach Back   Monitoring/Evaluation:  Dietary intake, exercise, label reading, and body weight prn.

## 2016-11-16 NOTE — Progress Notes (Addendum)
Patient ID: Jennifer Huffman, female   DOB: 06-Jun-1951, 66 y.o.   MRN: [id number]  HPI: Jennifer Huffman is a 66 y.o.-year-old female, returning for f/u for prediabetes, dx in ~2014, diet-controlled uncontrolled, without complications. She moved from [place] in 08/2015. Last visit 3 mo ago.  Prediabetes: Last hemoglobin A1c was: Lab Results  Component Value Date   HGBA1C 6.9 08/16/2016   HGBA1C 6.3 05/22/2016   HGBA1C 6.3 02/20/2016  10/31/2015: HbA1c 6.5%  Pt is on: - Metformin 500 mg 2x a day >> started 08/2017.  Pt is still not checking sugars at home. She could not get her meter to work. She got another one today.  Glucometer: [employer] Next  - no CKD, last BUN/creatinine:  07/03/2016: 11/0.83, glucose 98 10/31/2015: 16/0.87, Glucose 113 She is on telmisartan 80 mg daily.  - last set of lipids: 10/31/2015: 175/82/50/104 She is on atorvastatin 10 mg daily.  - last eye exam was " long time ago". No DR. Next exam scheduled 12/05/2016.   - no numbness and tingling in her feet.  She takes an aspirin 81 mg daily  She has a history of hyperlipidemia, hypertension, lymphedema, osteoarthritis of knees, mild intermittent asthma, eczema.  She also has a history of hypothyroidism. She is on levothyroxine 150 g daily.   Last TSH level: 03/06/2016: TSH 4.74 10/31/2015: TSH 4.36 (0.34-4.5)  She is taking the LT4: - fasting - with water - east 2h later - takes MVI along with LT4! - no Ca, Fe, PPIs  ROS: Constitutional: no weight gain/loss, no fatigue, no subjective hyperthermia/hypothermia Eyes: no blurry vision, no xerophthalmia ENT: no sore throat, no nodules palpated in throat, no dysphagia/odynophagia, no hoarseness Cardiovascular: no CP/SOB/palpitations/leg swelling Respiratory: no cough/SOB Gastrointestinal: no N/V/D/C Musculoskeletal: no muscle/joint aches Skin: no rashes Neurological: no tremors/numbness/tingling/dizziness  I reviewed pt's  medications, allergies, PMH, social hx, family hx, and changes were documented in the history of present illness. Otherwise, unchanged from my initial visit note.  Past Medical History:  Diagnosis Date  . Asthma, mild intermittent   . Blurred vision, bilateral   . Diabetes mellitus without complication (HCC)   . Hyperlipidemia   . Hypertension   . Hypothyroidism   . Lymphedema of both lower extremities   . Osteoarthritis of both knees    No past surgical history on file. Social History   Social History  . Marital Status: Legally Separated    Spouse Name: N/A  . Number of Children: 2   Occupational History  . retired   Social History Main Topics  . Smoking status: Never Smoker   . Smokeless tobacco: Never Used  . Alcohol Use: Vodka 1-2 drinks socially  . Drug Use: No   Current Outpatient Prescriptions on File Prior to Visit  Medication Sig Dispense Refill  . albuterol (PROVENTIL HFA;VENTOLIN HFA) 108 (90 Base) MCG/ACT inhaler Inhale 2 puffs into the lungs every 4 (four) hours as needed for wheezing or shortness of breath.    . AMLODIPINE BESYLATE PO Take 5 mg by mouth daily.    Marland Kitchen aspirin 81 MG tablet Take 81 mg by mouth daily.    Marland Kitchen BAYER MICROLET LANCETS lancets Use as instructed used to check one time a day 100 each 5  . Fluticasone-Salmeterol (ADVAIR) 250-50 MCG/DOSE AEPB Inhale 1 puff into the lungs 2 (two) times daily.    . furosemide (LASIX) 40 MG tablet Take 40 mg by mouth daily.    Marland Kitchen glucose blood (BAYER CONTOUR NEXT TEST) test strip Use  as instructed testing one time a day. 100 each 5  . LEVOTHYROXINE SODIUM PO Take 150 mcg by mouth daily.    . metFORMIN (GLUCOPHAGE) 500 MG tablet Take 1 tablet (500 mg total) by mouth 2 (two) times daily with a meal. 60 tablet 5  . Multiple Vitamins-Minerals (MULTIVITAMIN WITH MINERALS) tablet Take 1 tablet by mouth daily.    . potassium chloride (KLOR-CON 10) 10 MEQ tablet Take 10 mEq by mouth 2 (two) times daily.    Marland Kitchen. telmisartan  (MICARDIS) 80 MG tablet Take 80 mg by mouth daily.    . traMADol (ULTRAM) 50 MG tablet Take 1 tablet (50 mg total) by mouth every 6 (six) hours as needed. 30 tablet 0   No current facility-administered medications on file prior to visit.    No Known Allergies Family History  Problem Relation Age of Onset  . Cancer Mother     breast  . Hypertension Father    PE: BP 120/82 (BP Location: Left Arm, Patient Position: Sitting)   Pulse 69   Wt (!) 320 lb (145.2 kg)   SpO2 96%   BMI 61.47 kg/m  Wt Readings from Last 3 Encounters:  11/16/16 (!) 320 lb (145.2 kg)  11/16/16 (!) 320 lb (145.2 kg)  08/16/16 (!) 325 lb (147.4 kg)   Constitutional: Obese, in NAD, walks with walker Eyes: PERRLA, EOMI, no exophthalmos ENT: moist mucous membranes, no thyromegaly, no cervical lymphadenopathy Cardiovascular: RRR, No MRG, + lower extremity lymphedema Respiratory: CTA B Gastrointestinal: abdomen soft, NT, ND, BS+ Musculoskeletal: no deformities, strength intact in all 4 Skin: moist, warm, no rashes Neurological: no tremor with outstretched hands, DTR normal in all 4  ASSESSMENT: 1. Prediabetes  2. Hypothyroidism  3. Obesity grade 3  PLAN:  1. Patient with history of prediabetes >> last HbA1c was higher 6.9% >> we added Metformin, which she tolerates well. She continues to see the nutritionist  - HbA1c today: 6.1% (better) - I suggested to:  Patient Instructions  Please continue to work on diet and increasing activity and start checking sugars once a day. Bring sugars at next visit.   Please start Metformin 500 mg with dinner. After 4 days, increase to 500 mg 2x a day, with b'fast and dinner.   Please come back for a follow-up appointment in 3 months with your log.  - advised to start checking sugars at different times of the day - check 1x a day or every other day, rotating checks  - advised for yearly eye exams >> needs one >> scheduled - will check CMP, ACR, and Lipids in 5 weeks -  Return to clinic in 6 mo with sugar log   2. Hypothyroidism - reviewed last TSH >> slightly high - upon Q'ing, she is not taking LT4 correctly>> takes it with MVI >> advised to separate them - advised her to take the thyroid hormone every day, with water, at least 30 minutes before breakfast, separated by at least 4 hours from: - acid reflux medications - calcium - iron - multivitamins - will check TFTs in 5 weeks after the above change  Orders Placed This Encounter  Procedures  . TSH  . T4, free  . Microalbumin / creatinine urine ratio  . COMPLETE METABOLIC PANEL WITH GFR  . Lipid panel  . POCT HgB A1C   3. Obesity grade 3 - continues to work with nutritionist  Component     Latest Ref Rng & Units 12/21/2016  Sodium  135 - 146 mmol/L 140  Potassium     3.5 - 5.3 mmol/L 4.4  Chloride     98 - 110 mmol/L 102  CO2     20 - 31 mmol/L 29  Glucose     65 - 99 mg/dL 97  BUN     7 - 25 mg/dL 11  Creatinine     1.61 - 0.99 mg/dL 0.96  Total Bilirubin     0.2 - 1.2 mg/dL 0.4  Alkaline Phosphatase     33 - 130 U/L 87  AST     10 - 35 U/L 17  ALT     6 - 29 U/L 23  Total Protein     6.1 - 8.1 g/dL 7.2  Albumin     3.6 - 5.1 g/dL 3.9  Calcium     8.6 - 10.4 mg/dL 9.0  GFR, Est African American     >=60 mL/min 84  GFR, Est Non African American     >=60 mL/min 73  Cholesterol     0 - 200 mg/dL 045  Triglycerides     0.0 - 149.0 mg/dL 409.8  HDL Cholesterol     >39.00 mg/dL 11.91  VLDL     0.0 - 47.8 mg/dL 29.5  LDL (calc)     0 - 99 mg/dL 621 (H)  Total CHOL/HDL Ratio      3  NonHDL      127.11  TSH     0.35 - 4.50 uIU/mL 1.27  T4,Free(Direct)     0.60 - 1.60 ng/dL 3.08  Labs are normal.  Carlus Pavlov, MD PhD [facility]

## 2016-11-16 NOTE — Patient Instructions (Addendum)
Breakfast, Lunch, Dinner Daily  Consider Boost Glucose Control, Glucerna, or Premier Protein shake daily. You need to improve your nutritional intake. Be sure to get enough protein every day. Continue to avoid added salt. Choose frozen vegetables, fresh vegetables or canned without salt.   Eat more non starchy vegetables. LUVO frozen meals are low in sodium. Eat slowly, chew your food well.  Stop when you are satisfied.  Try the new meter to check your blood sugars. Make a date to go to the gym 3 days per week. On days you can not go to the gym, do armchair exercises and walk across the house every hour.

## 2016-11-16 NOTE — Patient Instructions (Addendum)
Please continue Metformin 500 mg 2x a day.  Continue Levothyroxine 150 mcg daily.  Take the thyroid hormone every day, with water, at least 30 minutes before breakfast, separated by at least 4 hours from: - acid reflux medications - calcium - iron - multivitamins  Please move the Multivitamins to lunchtime or later.  Come back for labs, fasting, in ~ 5 weeks.  Please come back for a follow-up appointment in 6 months.

## 2016-12-21 ENCOUNTER — Other Ambulatory Visit (INDEPENDENT_AMBULATORY_CARE_PROVIDER_SITE_OTHER): Payer: BLUE CROSS/BLUE SHIELD

## 2016-12-21 DIAGNOSIS — R7303 Prediabetes: Secondary | ICD-10-CM | POA: Diagnosis not present

## 2016-12-21 DIAGNOSIS — E039 Hypothyroidism, unspecified: Secondary | ICD-10-CM

## 2016-12-21 LAB — COMPLETE METABOLIC PANEL WITH GFR
ALBUMIN: 3.9 g/dL (ref 3.6–5.1)
ALK PHOS: 87 U/L (ref 33–130)
ALT: 23 U/L (ref 6–29)
AST: 17 U/L (ref 10–35)
BUN: 11 mg/dL (ref 7–25)
CALCIUM: 9 mg/dL (ref 8.6–10.4)
CO2: 29 mmol/L (ref 20–31)
CREATININE: 0.85 mg/dL (ref 0.50–0.99)
Chloride: 102 mmol/L (ref 98–110)
GFR, EST AFRICAN AMERICAN: 84 mL/min (ref >=60)
GFR, EST NON AFRICAN AMERICAN: 73 mL/min (ref >=60)
Glucose, Bld: 97 mg/dL (ref 65–99)
Potassium: 4.4 mmol/L (ref 3.5–5.3)
Sodium: 140 mmol/L (ref 135–146)
TOTAL PROTEIN: 7.2 g/dL (ref 6.1–8.1)
Total Bilirubin: 0.4 mg/dL (ref 0.2–1.2)

## 2016-12-21 LAB — TSH: TSH: 1.27 u[IU]/mL (ref 0.35–4.50)

## 2016-12-21 LAB — LIPID PANEL
Cholesterol: 179 mg/dL (ref 0–200)
HDL: 51.6 mg/dL (ref >39.00)
LDL Cholesterol: 102 mg/dL — ABNORMAL HIGH (ref 0–99)
NonHDL: 127.11
TRIGLYCERIDES: 128 mg/dL (ref 0.0–149.0)
Total CHOL/HDL Ratio: 3
VLDL: 25.6 mg/dL (ref 0.0–40.0)

## 2016-12-21 LAB — T4, FREE: Free T4: 0.97 ng/dL (ref 0.60–1.60)

## 2016-12-24 ENCOUNTER — Telehealth: Payer: Self-pay

## 2016-12-24 NOTE — Telephone Encounter (Signed)
Called patient and gave lab results. Patient had no questions or concerns.  

## 2016-12-24 NOTE — Telephone Encounter (Signed)
-----   Message from Carlus Pavlovristina Gherghe, MD sent at 12/24/2016 12:57 PM EDT ----- Raynelle FanningJulie, can you please call pt: all labs are good.

## 2017-01-10 ENCOUNTER — Other Ambulatory Visit: Payer: Self-pay | Admitting: Internal Medicine

## 2017-01-14 ENCOUNTER — Encounter (HOSPITAL_COMMUNITY): Payer: Self-pay | Admitting: *Deleted

## 2017-01-17 ENCOUNTER — Encounter (HOSPITAL_COMMUNITY)
Admission: RE | Disposition: A | Discharge status: Home / Self Care | Payer: Self-pay | Source: Ambulatory Visit | Attending: Gastroenterology

## 2017-01-17 ENCOUNTER — Ambulatory Visit (HOSPITAL_COMMUNITY): Payer: BLUE CROSS/BLUE SHIELD | Admitting: Anesthesiology

## 2017-01-17 ENCOUNTER — Ambulatory Visit (HOSPITAL_COMMUNITY)
Admission: RE | Admit: 2017-01-17 | Discharge: 2017-01-17 | Disposition: A | Discharge status: Home / Self Care | Payer: BLUE CROSS/BLUE SHIELD | Source: Ambulatory Visit | Attending: Gastroenterology | Admitting: Gastroenterology

## 2017-01-17 ENCOUNTER — Encounter (HOSPITAL_COMMUNITY): Payer: Self-pay | Admitting: *Deleted

## 2017-01-17 DIAGNOSIS — Z79899 Other long term (current) drug therapy: Secondary | ICD-10-CM | POA: Diagnosis not present

## 2017-01-17 DIAGNOSIS — J4520 Mild intermittent asthma, uncomplicated: Secondary | ICD-10-CM | POA: Insufficient documentation

## 2017-01-17 DIAGNOSIS — I10 Essential (primary) hypertension: Secondary | ICD-10-CM | POA: Diagnosis not present

## 2017-01-17 DIAGNOSIS — M170 Bilateral primary osteoarthritis of knee: Secondary | ICD-10-CM | POA: Insufficient documentation

## 2017-01-17 DIAGNOSIS — Z800 Family history of malignant neoplasm of digestive organs: Secondary | ICD-10-CM | POA: Insufficient documentation

## 2017-01-17 DIAGNOSIS — L309 Dermatitis, unspecified: Secondary | ICD-10-CM | POA: Insufficient documentation

## 2017-01-17 DIAGNOSIS — Z6841 Body Mass Index (BMI) 40.0 and over, adult: Secondary | ICD-10-CM | POA: Diagnosis not present

## 2017-01-17 DIAGNOSIS — K5730 Diverticulosis of large intestine without perforation or abscess without bleeding: Secondary | ICD-10-CM | POA: Insufficient documentation

## 2017-01-17 DIAGNOSIS — I890 Lymphedema, not elsewhere classified: Secondary | ICD-10-CM | POA: Insufficient documentation

## 2017-01-17 DIAGNOSIS — E039 Hypothyroidism, unspecified: Secondary | ICD-10-CM | POA: Insufficient documentation

## 2017-01-17 DIAGNOSIS — E785 Hyperlipidemia, unspecified: Secondary | ICD-10-CM | POA: Insufficient documentation

## 2017-01-17 DIAGNOSIS — K640 First degree hemorrhoids: Secondary | ICD-10-CM | POA: Diagnosis not present

## 2017-01-17 DIAGNOSIS — R7303 Prediabetes: Secondary | ICD-10-CM | POA: Insufficient documentation

## 2017-01-17 DIAGNOSIS — E669 Obesity, unspecified: Secondary | ICD-10-CM | POA: Insufficient documentation

## 2017-01-17 DIAGNOSIS — Z1211 Encounter for screening for malignant neoplasm of colon: Secondary | ICD-10-CM

## 2017-01-17 HISTORY — DX: Myoneural disorder, unspecified: G70.9

## 2017-01-17 HISTORY — PX: COLONOSCOPY WITH PROPOFOL: SHX5780

## 2017-01-17 HISTORY — DX: Dyspnea, unspecified: R06.00

## 2017-01-17 LAB — GLUCOSE, CAPILLARY: GLUCOSE-CAPILLARY: 108 mg/dL — AB (ref 65–99)

## 2017-01-17 SURGERY — COLONOSCOPY WITH PROPOFOL
Anesthesia: Monitor Anesthesia Care

## 2017-01-17 MED ORDER — LIDOCAINE HCL (CARDIAC) 20 MG/ML IV SOLN
INTRAVENOUS | Status: DC | PRN
  Administered 2017-01-17: 50 mg via INTRAVENOUS

## 2017-01-17 MED ORDER — LABETALOL HCL 5 MG/ML IV SOLN
INTRAVENOUS | Status: DC | PRN
  Administered 2017-01-17: 5 mg via INTRAVENOUS

## 2017-01-17 MED ORDER — PROPOFOL 10 MG/ML IV BOLUS
INTRAVENOUS | Status: AC
  Filled 2017-01-17: qty 20

## 2017-01-17 MED ORDER — PROPOFOL 500 MG/50ML IV EMUL
INTRAVENOUS | Status: DC | PRN
  Administered 2017-01-17: 200 ug/kg/min via INTRAVENOUS

## 2017-01-17 MED ORDER — GLYCOPYRROLATE 0.2 MG/ML IJ SOLN
INTRAMUSCULAR | Status: DC | PRN
  Administered 2017-01-17: 0.2 mg via INTRAVENOUS

## 2017-01-17 MED ORDER — LACTATED RINGERS IV SOLN
INTRAVENOUS | Status: DC
  Administered 2017-01-17: 1000 mL via INTRAVENOUS

## 2017-01-17 MED ORDER — SODIUM CHLORIDE 0.9 % IV SOLN: INTRAVENOUS | Status: DC

## 2017-01-17 SURGICAL SUPPLY — 21 items

## 2017-01-17 NOTE — Op Note (Signed)
[facility] Patient Name: Jennifer Huffman Procedure Date: 01/17/2017 MRN: [id number] Attending MD: Shirley Friar , MD Date of Birth: [birth year] CSN: [id number] Age: 66 Admit Type: Outpatient Procedure:                Colonoscopy Indications:              Colon cancer screening in patient at increased                            risk: Colorectal cancer in father Providers:                Shirley Friar, MD, Jacquiline Doe, RN,                            Kandice Robinsons, Technician Referring MD:              Medicines:                Propofol per Anesthesia, Monitored Anesthesia Care Complications:            No immediate complications. Estimated Blood Loss:     Estimated blood loss: none. Procedure:                Pre-Anesthesia Assessment:                           - Prior to the procedure, a History and Physical                            was performed, and patient medications and                            allergies were reviewed. The patient's tolerance of                            previous anesthesia was also reviewed. The risks                            and benefits of the procedure and the sedation                            options and risks were discussed with the patient.                            All questions were answered, and informed consent                            was obtained. Prior Anticoagulants: The patient has                            taken no previous anticoagulant or antiplatelet                            agents. ASA Grade Assessment: III - A patient with  severe systemic disease. After reviewing the risks                            and benefits, the patient was deemed in                            satisfactory condition to undergo the procedure.                           After obtaining informed consent, the colonoscope                            was passed under direct vision. Throughout the                      procedure, the patient's blood pressure, pulse, and                            oxygen saturations were monitored continuously. The                            EC-[id number] ([id number]) scope was introduced through                            the anus and advanced to the the cecum, identified                            by appendiceal orifice and ileocecal valve. The                            colonoscopy was performed without difficulty. The                            patient tolerated the procedure well. The quality                            of the bowel preparation was adequate and good. The                            appendiceal orifice and the rectum were                            photographed. Scope In: 10:43:19 AM Scope Out: 10:52:54 AM Scope Withdrawal Time: 0 hours 4 minutes 37 seconds  Total Procedure Duration: 0 hours 9 minutes 35 seconds  Findings:      The perianal and digital rectal examinations were normal.      Scattered small-mouthed diverticula were found in the sigmoid colon.      Internal hemorrhoids were found during retroflexion. The hemorrhoids       were small and Grade I (internal hemorrhoids that do not prolapse). Impression:               - Diverticulosis in the sigmoid colon.                           -  Internal hemorrhoids.                           - No specimens collected. Moderate Sedation:      N/A- Per Anesthesia Care Recommendation:           - Repeat colonoscopy in 5 years for screening                            purposes.                           - Patient has a contact number available for                            emergencies. The signs and symptoms of potential                            delayed complications were discussed with the                            patient. Return to normal activities tomorrow.                            Written discharge instructions were provided to the                            patient.                            - High fiber diet.                           - Continue present medications. Procedure Code(s):        --- Professional ---                           [phone], Colonoscopy, flexible; diagnostic, including                            collection of specimen(s) by brushing or washing,                            when performed (separate procedure) Diagnosis Code(s):        --- Professional ---                           Z80.0, Family history of malignant neoplasm of                            digestive organs                           K64.0, First degree hemorrhoids                           K57.30, Diverticulosis of large intestine without  perforation or abscess without bleeding CPT copyright 2016 [employer] Medical Association. All rights reserved. The codes documented in this report are preliminary and upon coder review may  be revised to meet current compliance requirements. Shirley Friar, MD 01/17/2017 10:58:22 AM This report has been signed electronically. Number of Addenda: 0

## 2017-01-17 NOTE — Interval H&P Note (Signed)
History and Physical Interval Note:  01/17/2017 9:30 AM  Jennifer Huffman  has presented today for surgery, with the diagnosis of screening  The various methods of treatment have been discussed with the patient and family. After consideration of risks, benefits and other options for treatment, the patient has consented to  Procedure(s): COLONOSCOPY WITH PROPOFOL (N/A) as a surgical intervention .  The patient's history has been reviewed, patient examined, no change in status, stable for surgery.  I have reviewed the patient's chart and labs.  Questions were answered to the patient's satisfaction.     Coco Sharpnack C.

## 2017-01-17 NOTE — H&P (Signed)
Date of Initial H&P: 01/04/17  History reviewed, patient examined, no change in status, stable for surgery.   

## 2017-01-17 NOTE — Anesthesia Preprocedure Evaluation (Signed)
Anesthesia Evaluation  Patient identified by MRN, date of birth, ID band Patient awake    Reviewed: Allergy & Precautions, NPO status , Patient's Chart, lab work & pertinent test results  Airway Mallampati: I       Dental no notable dental hx.    Pulmonary neg pulmonary ROS,    breath sounds clear to auscultation       Cardiovascular hypertension, negative cardio ROS   Rhythm:Regular Rate:Normal     Neuro/Psych negative neurological ROS  negative psych ROS   GI/Hepatic negative GI ROS, Neg liver ROS,   Endo/Other  negative endocrine ROSdiabetesMorbid obesity  Renal/GU negative Renal ROS  negative genitourinary   Musculoskeletal negative musculoskeletal ROS (+) Arthritis ,   Abdominal (+) + obese,   Peds negative pediatric ROS (+)  Hematology negative hematology ROS (+)   Anesthesia Other Findings   Reproductive/Obstetrics negative OB ROS                             Anesthesia Physical Anesthesia Plan  ASA: II  Anesthesia Plan: MAC   Post-op Pain Management:    Induction:   Airway Management Planned: Natural Airway and Nasal Cannula  Additional Equipment:   Intra-op Plan:   Post-operative Plan:   Informed Consent: I have reviewed the patients History and Physical, chart, labs and discussed the procedure including the risks, benefits and alternatives for the proposed anesthesia with the patient or authorized representative who has indicated his/her understanding and acceptance.     Plan Discussed with: CRNA  Anesthesia Plan Comments:         Anesthesia Quick Evaluation

## 2017-01-17 NOTE — Transfer of Care (Signed)
Immediate Anesthesia Transfer of Care Note  Patient: Jennifer Huffman  Procedure(s) Performed: Procedure(s): COLONOSCOPY WITH PROPOFOL (N/A)  Patient Location: PACU  Anesthesia Type:MAC  Level of Consciousness: awake, alert , oriented and patient cooperative  Airway & Oxygen Therapy: Patient Spontanous Breathing and Patient connected to face mask oxygen  Post-op Assessment: Report given to RN, Post -op Vital signs reviewed and stable and Patient moving all extremities X 4  Post vital signs: stable  Last Vitals:  Vitals:   01/17/17 0922 01/17/17 1100  BP: (!) 192/78 (!) 149/93  Pulse:  72  Resp:  (!) 23  Temp: 36.7 C 36.4 C    Last Pain:  Vitals:   01/17/17 1100  TempSrc: Oral         Complications: No apparent anesthesia complications

## 2017-01-17 NOTE — Discharge Instructions (Signed)

## 2017-01-17 NOTE — Anesthesia Postprocedure Evaluation (Addendum)
Anesthesia Post Note  Patient: Jennifer Huffman  Procedure(s) Performed: Procedure(s) (LRB): COLONOSCOPY WITH PROPOFOL (N/A)  Patient location during evaluation: Endoscopy Anesthesia Type: MAC Level of consciousness: awake and alert Pain management: pain level controlled Vital Signs Assessment: post-procedure vital signs reviewed and stable Respiratory status: spontaneous breathing, nonlabored ventilation, respiratory function stable and patient connected to nasal cannula oxygen Cardiovascular status: stable and blood pressure returned to baseline Anesthetic complications: no       Last Vitals:  Vitals:   01/17/17 1110 01/17/17 1120  BP: (!) 175/99 (!) 195/89  Pulse: 70 70  Resp: 17 19  Temp:      Last Pain:  Vitals:   01/17/17 1100  TempSrc: Oral                 [name],JAMES TERRILL

## 2017-01-18 ENCOUNTER — Encounter (HOSPITAL_COMMUNITY): Payer: Self-pay | Admitting: Gastroenterology

## 2017-03-01 NOTE — Addendum Note (Signed)
Addendum  created 03/01/17 1403 by Sharee HolsterMassagee, Judye Lorino, MD   Sign clinical note

## 2017-04-07 ENCOUNTER — Other Ambulatory Visit: Payer: Self-pay | Admitting: Internal Medicine

## 2017-05-06 ENCOUNTER — Telehealth: Payer: Self-pay | Admitting: Internal Medicine

## 2017-05-06 ENCOUNTER — Other Ambulatory Visit: Payer: Self-pay

## 2017-05-06 MED ORDER — METFORMIN HCL 500 MG PO TABS: ORAL_TABLET | ORAL | 2 refills | Status: DC

## 2017-05-06 NOTE — Telephone Encounter (Signed)
**  Remind patient they can make refill requests via MyChart**  Medication refill request (Name & Dosage):  metFORMIN (GLUCOPHAGE) 500 MG tablet  Preferred pharmacy (Name & Address):  [id number] [place] [place] - [address] [facility] [phone] (Phone) [phone] (Fax)     Other comments (if applicable):

## 2017-05-06 NOTE — Telephone Encounter (Signed)
Routing to you °

## 2017-05-06 NOTE — Telephone Encounter (Signed)
Submitted

## 2017-05-16 ENCOUNTER — Encounter: Payer: Self-pay | Admitting: Internal Medicine

## 2017-05-16 ENCOUNTER — Ambulatory Visit (INDEPENDENT_AMBULATORY_CARE_PROVIDER_SITE_OTHER): Payer: BLUE CROSS/BLUE SHIELD | Admitting: Internal Medicine

## 2017-05-16 VITALS — BP 124/82 | HR 69 | Wt 329.0 lb

## 2017-05-16 DIAGNOSIS — R7303 Prediabetes: Secondary | ICD-10-CM | POA: Diagnosis not present

## 2017-05-16 DIAGNOSIS — E039 Hypothyroidism, unspecified: Secondary | ICD-10-CM | POA: Diagnosis not present

## 2017-05-16 LAB — POCT GLYCOSYLATED HEMOGLOBIN (HGB A1C): Hemoglobin A1C: 6.2

## 2017-05-16 MED ORDER — FREESTYLE LIBRE SENSOR SYSTEM MISC: 1.0000 | 11 refills | Status: DC

## 2017-05-16 MED ORDER — FREESTYLE LIBRE READER DEVI: 1.0000 | Freq: Three times a day (TID) | 1 refills | Status: DC

## 2017-05-16 NOTE — Patient Instructions (Signed)
Please continue: - Metformin 500 mg 2x a day with meals.  Continue Levothyroxine 150 mcg daily.  Take the thyroid hormone every day, with water, at least 30 minutes before breakfast, separated by at least 4 hours from: - acid reflux medications - calcium - iron - multivitamins  Move multivitamins 4h after Levothyroxine.  Please come back in 1.5-2 months for thyroid labs.  Please come back for a follow-up appointment in 6 months

## 2017-05-16 NOTE — Addendum Note (Signed)
Addended by: Darene LamerHOMPSON, Javin Nong T on: 05/16/2017 04:17 PM   Modules accepted: Orders

## 2017-05-16 NOTE — Progress Notes (Signed)
Patient ID: Loetta RoughFrances Hurless, female   DOB: 11/09/1950, 66 y.o.   MRN: [id number]  HPI: Loetta RoughFrances Benham is a 66 y.o.-year-old female, returning for f/u for prediabetes, dx in ~2014, diet-controlled uncontrolled, without complications. [place]. Last visit 6 mo ago.  Prediabetes: Last hemoglobin A1c was: Lab Results  Component Value Date   HGBA1C 6.1 11/16/2016   HGBA1C 6.9 08/16/2016   HGBA1C 6.3 05/22/2016  10/31/2015: HbA1c 6.5%  Pt is on: - Metformin 500 mg 2x a day >> started 08/2017.  She is not checking sugars at home.  Glucometer: [employer] Cont[employer] Next  - no CKD, last BUN/creatinine:  Lab Results  Component Value Date   BUN 11 12/21/2016   Lab Results  Component Value Date   CREATININE 0.85 12/21/2016  07/03/2016: 11/0.83, glucose 98 10/31/2015: 16/0.87, Glucose 113 On Telmisartan 80.  - last set of lipids: Lab Results  Component Value Date   CHOL 179 12/21/2016   HDL 51.60 12/21/2016   LDLCALC 102 (H) 12/21/2016   TRIG 128.0 12/21/2016   CHOLHDL 3 12/21/2016  10/31/2015: 175/82/50/104 On Atorvastatin.  - last eye exam "years ago" >> No DR.  - denies numbness and tingling in her feet.  She takes ASA 81.  She has a history of hyperlipidemia, hypertension, lymphedema, osteoarthritis of knees, mild intermittent asthma, eczema.  She also has a history of hypothyroidism. She is on levothyroxine 150 g daily.   Last TSH level: Lab Results  Component Value Date   TSH 1.27 12/21/2016   03/06/2016: TSH 4.74 10/31/2015: TSH 4.36 (0.34-4.5)  Pt is on levothyroxine 150 mcg daily, taken: - in am - fasting - at least 30 min from b'fast - no Ca, Fe, PPIs - MVI along with LT4 still, despite advice to move it later - not on Biotin  ROS: Constitutional: no weight gain/no weight loss, no fatigue, no subjective hyperthermia, no subjective hypothermia Eyes: no blurry vision, no xerophthalmia ENT: no sore throat, no nodules palpated  in throat, no dysphagia, no odynophagia, no hoarseness Cardiovascular: no CP/no SOB/no palpitations/no leg swelling Respiratory: no cough/no SOB/no wheezing Gastrointestinal: no N/no V/no D/no C/no acid reflux Musculoskeletal: no muscle aches/no joint aches Skin: no rashes, no hair loss Neurological: no tremors/no numbness/no tingling/no dizziness  I reviewed pt's medications, allergies, PMH, social hx, family hx, and changes were documented in the history of present illness. Otherwise, unchanged from my initial visit note.  Past Medical History:  Diagnosis Date  . Asthma, mild intermittent   . Blurred vision, bilateral   . Diabetes mellitus without complication (HCC)    type 2  . Dyspnea    with exertion  . Hyperlipidemia   . Hypertension   . Hypothyroidism   . Lymphedema of both lower extremities    compressions hose when out anf wraps at home  . Neuromuscular disorder (HCC)   . Osteoarthritis of both knees    Past Surgical History:  Procedure Laterality Date  . ABDOMINAL HYSTERECTOMY     partial  . COLONOSCOPY WITH PROPOFOL N/A 01/17/2017   Procedure: COLONOSCOPY WITH PROPOFOL;  Surgeon: Charlott RakesVincent Schooler, MD;  Location: [facility];  Service: Endoscopy;  Laterality: N/A;   Social History   Social History  . Marital Status: Legally Separated    Spouse Name: N/A  . Number of Children: 2   Occupational History  . retired   Social History Main Topics  . Smoking status: Never Smoker   . Smokeless tobacco: Never Used  . Alcohol  Use: Vodka 1-2 drinks socially  . Drug Use: No   Current Outpatient Prescriptions on File Prior to Visit  Medication Sig Dispense Refill  . albuterol (PROVENTIL HFA;VENTOLIN HFA) 108 (90 Base) MCG/ACT inhaler Inhale 2 puffs into the lungs every 4 (four) hours as needed for wheezing or shortness of breath.    Marland Kitchen amLODipine (NORVASC) 5 MG tablet Take 5 mg by mouth daily.    Marland Kitchen aspirin 81 MG tablet Take 81 mg by mouth daily.    Marland Kitchen BAYER MICROLET  LANCETS lancets Use as instructed used to check one time a day 100 each 5  . Fluticasone-Salmeterol (ADVAIR) 250-50 MCG/DOSE AEPB Inhale 1 puff into the lungs 2 (two) times daily.     . furosemide (LASIX) 40 MG tablet Take 40 mg by mouth daily.    Marland Kitchen glucose blood (BAYER CONTOUR NEXT TEST) test strip Use as instructed testing one time a day. 100 each 5  . levothyroxine (SYNTHROID, LEVOTHROID) 150 MCG tablet Take 150 mcg by mouth daily before breakfast.    . metFORMIN (GLUCOPHAGE) 500 MG tablet TAKE 1 TABLET BY MOUTH TWICE A DAY WITH A MEAL 60 tablet 2  . Multiple Vitamins-Minerals (MULTIVITAMIN WITH MINERALS) tablet Take 1 tablet by mouth daily.    . potassium chloride (KLOR-CON 10) 10 MEQ tablet Take 20 mEq by mouth daily.     Marland Kitchen telmisartan (MICARDIS) 80 MG tablet Take 80 mg by mouth daily.    . traMADol (ULTRAM) 50 MG tablet Take 1 tablet (50 mg total) by mouth every 6 (six) hours as needed. 30 tablet 0   No current facility-administered medications on file prior to visit.    No Known Allergies Family History  Problem Relation Age of Onset  . Cancer Mother        breast  . Hypertension Father    PE: BP 124/82 (BP Location: Left Arm, Patient Position: Sitting)   Pulse 69   Wt (!) 329 lb (149.2 kg)   SpO2 95%   BMI 62.16 kg/m  Wt Readings from Last 3 Encounters:  05/16/17 (!) 329 lb (149.2 kg)  01/17/17 300 lb (136.1 kg)  11/16/16 (!) 320 lb (145.2 kg)   Constitutional: overweight, in NAD, walks with walker Eyes: PERRLA, EOMI, no exophthalmos ENT: moist mucous membranes, no thyromegaly, no cervical lymphadenopathy Cardiovascular: RRR, No MRG, + LE lymphedema Respiratory: CTA B Gastrointestinal: abdomen soft, NT, ND, BS+ Musculoskeletal: no deformities, strength intact in all 4 Skin: moist, warm, no rashes Neurological: no tremor with outstretched hands, DTR normal in all 4   ASSESSMENT: 1. Prediabetes  2. Hypothyroidism  PLAN:  1. Patient with history of prediabetes >>  last HbA1c was great, at 6.1%. Today, HbA1c is 6.2% (same). She is only on metformin 500 mg 2x a day, which appears to work well for her. She is not checking sugars at home >> advised to start. - I suggested to:  Patient Instructions  Please continue: - Metformin 500 mg 2x a day with meals.  Continue Levothyroxine 150 mcg daily.  Take the thyroid hormone every day, with water, at least 30 minutes before breakfast, separated by at least 4 hours from: - acid reflux medications - calcium - iron - multivitamins  Move multivitamins 4h after Levothyroxine.  Please come back in 1.5-2 months for thyroid labs.  Please come back for a follow-up appointment in 6 months  - start checking sugars at different times of the day - check 1x a day, rotating checks - advised  for yearly eye exams >> she needs one! - Return to clinic in 6 mo with sugar log   2. Hypothyroidism - latest thyroid labs reviewed with pt >> TSH normal - she continues on LT4 150 mcg daily - we discussed about taking the thyroid hormone every day, with water, >30 minutes before breakfast, separated by >4 hours from acid reflux medications, calcium, iron, multivitamins. Pt. is not taking it correctly >> will move MVI later in the day - will check thyroid tests in 2 mo after the change: TSH and fT4 - RTC in 6 mo  Orders Placed This Encounter  Procedures  . T4, free  . TSH    Carlus Pavlov, MD PhD [facility]

## 2017-07-04 ENCOUNTER — Other Ambulatory Visit (INDEPENDENT_AMBULATORY_CARE_PROVIDER_SITE_OTHER): Payer: BLUE CROSS/BLUE SHIELD

## 2017-07-04 DIAGNOSIS — E039 Hypothyroidism, unspecified: Secondary | ICD-10-CM | POA: Diagnosis not present

## 2017-07-04 LAB — T4, FREE: Free T4: 0.92 ng/dL (ref 0.60–1.60)

## 2017-07-04 LAB — TSH: TSH: 3.08 u[IU]/mL (ref 0.35–4.50)

## 2017-07-05 ENCOUNTER — Telehealth: Payer: Self-pay

## 2017-07-05 NOTE — Telephone Encounter (Signed)
-----   Message from Carlus Pavlov, MD sent at 07/04/2017  4:36 PM EDT ----- Raynelle Fanning, can you please call pt: Normal TFTs

## 2017-07-05 NOTE — Telephone Encounter (Signed)
Called patient and gave lab results. Patient had no questions or concerns.  

## 2017-08-04 ENCOUNTER — Other Ambulatory Visit: Payer: Self-pay | Admitting: Internal Medicine

## 2017-08-29 ENCOUNTER — Encounter: Payer: Self-pay | Admitting: Neurology

## 2017-11-04 ENCOUNTER — Telehealth: Payer: Self-pay | Admitting: Internal Medicine

## 2017-11-04 MED ORDER — LEVOTHYROXINE SODIUM 150 MCG PO TABS: 150.0000 ug | ORAL_TABLET | Freq: Every day | ORAL | 1 refills | Status: DC

## 2017-11-04 NOTE — Telephone Encounter (Signed)
Sent medication

## 2017-11-04 NOTE — Telephone Encounter (Signed)
It appears you have not filled her RX for this before, please advise

## 2017-11-04 NOTE — Telephone Encounter (Signed)
Patient need a refill of levothyroxine (SYNTHROID, LEVOTHROID) 150 MCG tablet [[id number]]    Send to  Pharmacy:  [id number] - , [place] - [address] [facility] [address] DEA #:  [id number]

## 2017-11-04 NOTE — Telephone Encounter (Signed)
It is ok, I am managing her hypothyroidism.

## 2017-11-15 ENCOUNTER — Encounter: Payer: Self-pay | Admitting: Internal Medicine

## 2017-11-15 ENCOUNTER — Ambulatory Visit (INDEPENDENT_AMBULATORY_CARE_PROVIDER_SITE_OTHER): Payer: Medicare Other | Admitting: Internal Medicine

## 2017-11-15 VITALS — BP 142/82 | HR 72 | Ht 61.0 in | Wt 321.4 lb

## 2017-11-15 DIAGNOSIS — R7303 Prediabetes: Secondary | ICD-10-CM

## 2017-11-15 DIAGNOSIS — E039 Hypothyroidism, unspecified: Secondary | ICD-10-CM | POA: Diagnosis not present

## 2017-11-15 LAB — POCT GLYCOSYLATED HEMOGLOBIN (HGB A1C): HEMOGLOBIN A1C: 6.1

## 2017-11-15 NOTE — Progress Notes (Signed)
Patient ID: Jennifer Huffman, female   DOB: 21-Aug-1951, 67 y.o.   MRN: [id number]  HPI: Jennifer Huffman is a 67 y.o.-year-old female, returning for f/u for prediabetes, dx in ~2014, diet-controlled uncontrolled, without complications. She moved from [place] in 08/2015. Last visit 6 months ago.  She started to go to the gym 3x a week in 08/2017  - 3 machines.  Prediabetes: Last hemoglobin A1c was: Lab Results  Component Value Date   HGBA1C 6.1 11/15/2017   HGBA1C 6.2 05/16/2017   HGBA1C 6.1 11/16/2016  10/31/2015: HbA1c 6.5%  Pt is on: - Metformin 500 mg 2x a day >> started 08/2017.  No GI side effects  She is not checking sugars at home.  Glucometer: [employer] Next  -No CKD, last BUN/creatinine:  Lab Results  Component Value Date   BUN 11 12/21/2016   Lab Results  Component Value Date   CREATININE 0.85 12/21/2016  07/03/2016: 11/0.83, glucose 98 10/31/2015: 16/0.87, Glucose 113 On Telmisartan 80.  -+ HL; last set of lipids: Lab Results  Component Value Date   CHOL 179 12/21/2016   HDL 51.60 12/21/2016   LDLCALC 102 (H) 12/21/2016   TRIG 128.0 12/21/2016   CHOLHDL 3 12/21/2016  10/31/2015: 175/82/50/104 On atorvastatin.  - last eye exam 08/2017 >> no DR. + cataracts.  - Denies numbness and tingling in her feet.  On ASA 81.  She has a history of HTN, lymphedema, osteoarthritis of knees, mild intermittent asthma, eczema.   Hypothyroidism:  Last TSH level normal: Lab Results  Component Value Date   TSH 3.08 07/04/2017   TSH 1.27 12/21/2016   03/06/2016: TSH 4.74 10/31/2015: TSH 4.36 (0.34-4.5)  Pt is on levothyroxine 150 mcg daily, taken: - in am - fasting - at least 30 min from b'fast - no Ca, Fe, PPIs - not on Biotin - Now taking multivitamins later in the day   ROS: Constitutional: no weight gain/no weight loss, no fatigue, no subjective hyperthermia, no subjective hypothermia Eyes: no blurry vision, no xerophthalmia ENT: + sore  throat, no nodules palpated in throat, no dysphagia, no odynophagia, no hoarseness Cardiovascular: no CP/no SOB/no palpitations/+ leg swelling Respiratory: no cough/no SOB/+ wheezing Gastrointestinal: no N/no V/no D/no C/no acid reflux Musculoskeletal: no muscle aches/no joint aches Skin: no rashes, no hair loss Neurological: no tremors/no numbness/no tingling/no dizziness  I reviewed pt's medications, allergies, PMH, social hx, family hx, and changes were documented in the history of present illness. Otherwise, unchanged from my initial visit note.  Past Medical History:  Diagnosis Date  . Asthma, mild intermittent   . Blurred vision, bilateral   . Diabetes mellitus without complication (HCC)    type 2  . Dyspnea    with exertion  . Hyperlipidemia   . Hypertension   . Hypothyroidism   . Lymphedema of both lower extremities    compressions hose when out anf wraps at home  . Neuromuscular disorder (HCC)   . Osteoarthritis of both knees    Past Surgical History:  Procedure Laterality Date  . ABDOMINAL HYSTERECTOMY     partial  . COLONOSCOPY WITH PROPOFOL N/A 01/17/2017   Procedure: COLONOSCOPY WITH PROPOFOL;  Surgeon: Charlott Rakes, MD;  Location: [facility];  Service: Endoscopy;  Laterality: N/A;   Social History   Social History  . Marital Status: Legally Separated    Spouse Name: N/A  . Number of Children: 2   Occupational History  . retired   Social History Main Topics  . Smoking status: Never  Smoker   . Smokeless tobacco: Never Used  . Alcohol Use: Vodka 1-2 drinks socially  . Drug Use: No   Current Outpatient Medications on File Prior to Visit  Medication Sig Dispense Refill  . albuterol (PROVENTIL HFA;VENTOLIN HFA) 108 (90 Base) MCG/ACT inhaler Inhale 2 puffs into the lungs every 4 (four) hours as needed for wheezing or shortness of breath.    Marland Kitchen. amLODipine (NORVASC) 5 MG tablet Take 5 mg by mouth daily.    Marland Kitchen. aspirin 81 MG tablet Take 81 mg by mouth daily.     Marland Kitchen. BAYER MICROLET LANCETS lancets Use as instructed used to check one time a day 100 each 5  . Continuous Blood Gluc Receiver (FREESTYLE LIBRE READER) DEVI 1 Device by Does not apply route 3 (three) times daily. 1 Device 1  . Continuous Blood Gluc Sensor (FREESTYLE LIBRE SENSOR SYSTEM) MISC 1 Device by Does not apply route every 30 (thirty) days. 3 each 11  . Fluticasone-Salmeterol (ADVAIR) 250-50 MCG/DOSE AEPB Inhale 1 puff into the lungs 2 (two) times daily.     . furosemide (LASIX) 40 MG tablet Take 40 mg by mouth daily.    Marland Kitchen. glucose blood (BAYER CONTOUR NEXT TEST) test strip Use as instructed testing one time a day. 100 each 5  . levothyroxine (SYNTHROID, LEVOTHROID) 150 MCG tablet Take 1 tablet (150 mcg total) by mouth daily before breakfast. 90 tablet 1  . metFORMIN (GLUCOPHAGE) 500 MG tablet TAKE 1 TABLET BY MOUTH TWICE A DAY WITH A MEAL 180 tablet 2  . Multiple Vitamins-Minerals (MULTIVITAMIN WITH MINERALS) tablet Take 1 tablet by mouth daily.    . potassium chloride (KLOR-CON 10) 10 MEQ tablet Take 20 mEq by mouth daily.     Marland Kitchen. telmisartan (MICARDIS) 80 MG tablet Take 80 mg by mouth daily.    . traMADol (ULTRAM) 50 MG tablet Take 1 tablet (50 mg total) by mouth every 6 (six) hours as needed. 30 tablet 0   No current facility-administered medications on file prior to visit.    No Known Allergies Family History  Problem Relation Age of Onset  . Cancer Mother        breast  . Hypertension Father    PE: BP (!) 142/82 (BP Location: Left Arm, Patient Position: Sitting, Cuff Size: Large)   Pulse 72   Ht 5\' 1"  (1.549 m)   Wt (!) 321 lb 6.4 oz (145.8 kg)   SpO2 97%   BMI 60.73 kg/m  Wt Readings from Last 3 Encounters:  11/15/17 (!) 321 lb 6.4 oz (145.8 kg)  05/16/17 (!) 329 lb (149.2 kg)  01/17/17 300 lb (136.1 kg)   Constitutional: overweight, in NAD walks with a walker,  Eyes: PERRLA, EOMI, no exophthalmos ENT: moist mucous membranes, no thyromegaly, no cervical  lymphadenopathy Cardiovascular: RRR, No MRG,  + LE lymphedema Respiratory: CTA B Gastrointestinal: abdomen soft, NT, ND, BS+ Musculoskeletal: no deformities, strength intact in all 4 Skin: moist, warm, no rashes Neurological: no tremor with outstretched hands, DTR normal in all 4  ASSESSMENT: 1. Prediabetes  2. Hypothyroidism  PLAN:  1. Patient with history of prediabetes, with last HbA1c 6.2% at last visit.  She continues on metformin 500 mg twice a day, which appears to work well for her.  At last visit I advised her to start checking sugars at home. - she is not checking sugars - she started to go to the gym 5 mo ago >> now 8 lbs less and sugars did not  increase  - will continue Metformin - I suggested to:  Patient Instructions  Please continue: - Metformin 500 mg 2x a day with meals.  Continue Levothyroxine 150 mcg daily.  Take the thyroid hormone every day, with water, at least 30 minutes before breakfast, separated by at least 4 hours from: - acid reflux medications - calcium - iron - multivitamins  Please come back for a follow-up appointment in 6 months.  - today, HbA1c is 6.1% (stable) - continue checking sugars at different times of the day - check 1x a day, rotating checks - advised for yearly eye exams >> she is UTD - Return to clinic in 3 mo with sugar log   2. Hypothyroidism - latest thyroid labs reviewed with pt >> normal in 07/2017 - she continues on LT4 150 mcg daily - pt feels good on this dose. - we discussed about taking the thyroid hormone every day, with water, >30 minutes before breakfast, separated by >4 hours from acid reflux medications, calcium, iron, multivitamins. Pt. is taking it correctly now.  at last visit, we moved her multivitamins later in the day.  - time spent with the patient: 25 min, of which >50% was spent in obtaining information about her symptoms, reviewing her previous labs, evaluations, and treatments, counseling her about her  condition (please see the discussed topics above), and developing a plan to further treat them.  Carlus Pavlov, MD PhD [facility]

## 2017-11-15 NOTE — Patient Instructions (Signed)
Please continue: - Metformin 500 mg 2x a day with meals.  Continue Levothyroxine 150 mcg daily.  Take the thyroid hormone every day, with water, at least 30 minutes before breakfast, separated by at least 4 hours from: - acid reflux medications - calcium - iron - multivitamins  Please come back for a follow-up appointment in 6 months.

## 2017-11-20 DIAGNOSIS — J028 Acute pharyngitis due to other specified organisms: Secondary | ICD-10-CM | POA: Diagnosis not present

## 2017-11-20 DIAGNOSIS — R0981 Nasal congestion: Secondary | ICD-10-CM | POA: Diagnosis not present

## 2017-11-27 DIAGNOSIS — Z6841 Body Mass Index (BMI) 40.0 and over, adult: Secondary | ICD-10-CM | POA: Diagnosis not present

## 2017-11-27 DIAGNOSIS — M170 Bilateral primary osteoarthritis of knee: Secondary | ICD-10-CM | POA: Diagnosis not present

## 2017-11-27 DIAGNOSIS — I890 Lymphedema, not elsewhere classified: Secondary | ICD-10-CM | POA: Diagnosis not present

## 2017-11-27 DIAGNOSIS — J028 Acute pharyngitis due to other specified organisms: Secondary | ICD-10-CM | POA: Diagnosis not present

## 2017-11-27 DIAGNOSIS — Z7409 Other reduced mobility: Secondary | ICD-10-CM | POA: Diagnosis not present

## 2017-12-06 ENCOUNTER — Ambulatory Visit (INDEPENDENT_AMBULATORY_CARE_PROVIDER_SITE_OTHER): Payer: Medicare Other | Admitting: Neurology

## 2017-12-06 ENCOUNTER — Encounter: Payer: Self-pay | Admitting: Neurology

## 2017-12-06 ENCOUNTER — Telehealth: Payer: Self-pay | Admitting: Neurology

## 2017-12-06 VITALS — BP 110/80 | HR 78 | Ht 61.0 in | Wt 271.4 lb

## 2017-12-06 DIAGNOSIS — G5601 Carpal tunnel syndrome, right upper limb: Secondary | ICD-10-CM | POA: Diagnosis not present

## 2017-12-06 DIAGNOSIS — M1711 Unilateral primary osteoarthritis, right knee: Secondary | ICD-10-CM | POA: Diagnosis not present

## 2017-12-06 DIAGNOSIS — M1712 Unilateral primary osteoarthritis, left knee: Secondary | ICD-10-CM | POA: Diagnosis not present

## 2017-12-06 MED ORDER — GABAPENTIN 300 MG PO CAPS: ORAL_CAPSULE | ORAL | 5 refills | Status: DC

## 2017-12-06 NOTE — Telephone Encounter (Signed)
Doctor Luiz BlareGraves is requesting the results from the EMG. Please Fax. Thanks

## 2017-12-06 NOTE — Procedures (Signed)
[facility]  [address], [address]  [place], [place] [address] Tel: [phone]) [phone] Fax:  [phone] Test Date:  12/06/2017  Patient: Jennifer Huffman DOB: [birth year] Physician: Nita Sickleonika Vinita Prentiss, DO  Sex: Female Height: 5\' 1"  Ref Phys: Nita Sickleonika Keithon Mccoin, DO  ID#: [id number] Temp: 35.1C Technician:    Patient Complaints: This is a 67 year old female referred for evaluation of right hand paresthesias.  NCV & EMG Findings: Extensive electrodiagnostic testing of the right upper extremity shows:  1. Right median sensory response shows prolonged distal peak latency (5.8 ms) and reduced amplitude (5.7 V). Right ulnar sensory response is within normal limits. 2. 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 Right ulnar motor responses within normal limits. 3. Chronic motor axon loss changes are seen in the right abductor pollicis brevis muscle, without accompanied active denervation.  Impression: Right median neuropathy at or distal to the wrist, consistent with the clinical diagnosis of carpal tunnel syndrome. Overall, these findings are moderate in degree electrically.   ___________________________ Nita Sickleonika Meha Vidrine, DO    Nerve Conduction Studies Anti Sensory Summary Table   Site NR Peak (ms) Norm Peak (ms) P-T Amp (V) Norm P-T Amp  Right Median Anti Sensory (2nd Digit)  35.1C  Wrist    5.8 <3.8 5.7 >10  Right Ulnar Anti Sensory (5th Digit)  35.1C  Wrist    2.6 <3.2 22.8 >5   Motor Summary Table   Site NR Onset (ms) Norm Onset (ms) O-P Amp (mV) Norm O-P Amp Site1 Site2 Delta-0 (ms) Dist (cm) Vel (m/s) Norm Vel (m/s)  Right Median Motor (Abd Poll Brev)  35.1C  Wrist    3.7 <4.0 6.5 >5 Elbow Wrist 4.9 26.0 53 >50  Elbow    8.6  5.8  Ulnar-wrist crossover  Elbow 4.7 0.0    Ulnar-wrist crossover    3.9  6.7         Right Ulnar Motor (Abd Dig Minimi)  35.1C  Wrist    1.8 <3.1 10.9 >7 B Elbow Wrist 4.0 24.0 60 >50  B Elbow    5.8  9.3  A Elbow B Elbow 1.2 10.0 83 >50  A Elbow    7.0  9.8          EMG   Side Muscle Ins Act Fibs Psw Fasc Number Recrt Dur Dur. Amp Amp. Poly Poly. Comment  Right 1stDorInt Nml Nml Nml Nml Nml Nml Nml Nml Nml Nml Nml Nml N/A  Right Abd Poll Brev Nml Nml Nml Nml 2- Rapid Many 1+ Many 1+ Nml Nml N/A  Right PronatorTeres Nml Nml Nml Nml Nml Nml Nml Nml Nml Nml Nml Nml N/A  Right Triceps Nml Nml Nml Nml Nml Nml Nml Nml Nml Nml Nml Nml N/A  Right Deltoid Nml Nml Nml Nml Nml Nml Nml Nml Nml Nml Nml Nml N/A      Waveforms:

## 2017-12-06 NOTE — Patient Instructions (Addendum)
Start gabapentin 300mg  at bedtime x 1 week, then increase to 1 tablet twice daily  Start using a wrist splint   Talk to your orthopeadic doctor about your carpal tunnel syndrome on the right

## 2017-12-06 NOTE — Progress Notes (Signed)
[facility] Clinic Note - Initial Visit   Date: 12/06/17  Jennifer Huffman MRN: [id number] DOB: 05-03-1951   Dear Dr. Constance GoltzSchoenhoff:  Thank you for your kind referral of Jennifer Huffman for consultation of right hand numbness. Although her history is well known to you, please allow us to reiterate it for the purpose of our medical record. The patient was accompanied to the clinic by self.    History of Present Illness: Jennifer Huffman is a 67 y.o. right-handed female with diabetes mellitus, hypertension, hyperlipidemia, asthma, lymphedema, and hypothyroidism presenting for evaluation of right hand numbness.    Starting around the end of 2018, she began having shooting pain in the right hand, especially over the thumb, index finger, and middle finger.  She has more numbness and often tries to shake her hand awake.  It often wakes her up from sleeping.  She denies any weakness of the hand, no neck pain.  Sometimes, she has tingling of the left hand, but no as severe as the right hand. She does not have any numbness/tingling of the feet.   Out-side paper records, electronic medical record, and images have been reviewed where available and summarized as:  Lab Results  Component Value Date   TSH 3.08 07/04/2017   Lab Results  Component Value Date   HGBA1C 6.1 11/15/2017     Past Medical History:  Diagnosis Date  . Asthma, mild intermittent   . Blurred vision, bilateral   . Diabetes mellitus without complication (HCC)    type 2  . Dyspnea    with exertion  . Hyperlipidemia   . Hypertension   . Hypothyroidism   . Lymphedema of both lower extremities    compressions hose when out anf wraps at home  . Neuromuscular disorder (HCC)   . Osteoarthritis of both knees     Past Surgical History:  Procedure Laterality Date  . ABDOMINAL HYSTERECTOMY     partial  . COLONOSCOPY WITH PROPOFOL N/A 01/17/2017   Procedure: COLONOSCOPY WITH PROPOFOL;   Surgeon: Charlott RakesVincent Schooler, MD;  Location: [facility];  Service: Endoscopy;  Laterality: N/A;     Medications:  Outpatient Encounter Medications as of 12/06/2017  Medication Sig  . albuterol (PROVENTIL HFA;VENTOLIN HFA) 108 (90 Base) MCG/ACT inhaler Inhale 2 puffs into the lungs every 4 (four) hours as needed for wheezing or shortness of breath.  Marland Kitchen. amLODipine (NORVASC) 5 MG tablet Take 5 mg by mouth daily.  Marland Kitchen. aspirin 81 MG tablet Take 81 mg by mouth daily.  Marland Kitchen. BAYER MICROLET LANCETS lancets Use as instructed used to check one time a day  . Continuous Blood Gluc Receiver (FREESTYLE LIBRE READER) DEVI 1 Device by Does not apply route 3 (three) times daily.  . Continuous Blood Gluc Sensor (FREESTYLE LIBRE SENSOR SYSTEM) MISC 1 Device by Does not apply route every 30 (thirty) days.  . Fluticasone-Salmeterol (ADVAIR) 250-50 MCG/DOSE AEPB Inhale 1 puff into the lungs 2 (two) times daily.   . furosemide (LASIX) 40 MG tablet Take 40 mg by mouth daily.  Marland Kitchen. gabapentin (NEURONTIN) 300 MG capsule Start 1 tablet at bedtime x 1 week, then increase to 1 tablet twice daily  . glucose blood (BAYER CONTOUR NEXT TEST) test strip Use as instructed testing one time a day.  . levothyroxine (SYNTHROID, LEVOTHROID) 150 MCG tablet Take 1 tablet (150 mcg total) by mouth daily before breakfast.  . metFORMIN (GLUCOPHAGE) 500 MG tablet TAKE 1 TABLET BY MOUTH TWICE A DAY WITH A MEAL  .  Multiple Vitamins-Minerals (MULTIVITAMIN WITH MINERALS) tablet Take 1 tablet by mouth daily.  . potassium chloride (KLOR-CON 10) 10 MEQ tablet Take 20 mEq by mouth daily.   Marland Kitchen telmisartan (MICARDIS) 80 MG tablet Take 80 mg by mouth daily.  . traMADol (ULTRAM) 50 MG tablet Take 1 tablet (50 mg total) by mouth every 6 (six) hours as needed.   No facility-administered encounter medications on file as of 12/06/2017.      Allergies: No Known Allergies  Family History: Family History  Problem Relation Age of Onset  . Cancer Mother         breast  . Hypertension Father   Two sisters with breast cancer.   Social History: Social History   Tobacco Use  . Smoking status: Never Smoker  . Smokeless tobacco: Never Used  Substance Use Topics  . Alcohol use: No    Alcohol/week: 0.0 oz  . Drug use: No   Social History   Social History Narrative  . Not on file    Review of Systems:  CONSTITUTIONAL: No fevers, chills, night sweats, or weight loss.   EYES: No visual changes or eye pain ENT: No hearing changes.  No history of nose bleeds.   RESPIRATORY: No cough, wheezing and shortness of breath.   CARDIOVASCULAR: Negative for chest pain, and palpitations.   GI: Negative for abdominal discomfort, blood in stools or black stools.  No recent change in bowel habits.   GU:  No history of incontinence.   MUSCLOSKELETAL: +history of joint pain or swelling.  No myalgias.   SKIN: Negative for lesions, rash, and itching.   HEMATOLOGY/ONCOLOGY: Negative for prolonged bleeding, bruising easily, and swollen nodes.  No history of cancer.   ENDOCRINE: Negative for cold or heat intolerance, polydipsia or goiter.   PSYCH:  No depression or anxiety symptoms.   NEURO: As Above.   Vital Signs:  BP 110/80   Pulse 78   Ht 5\' 1"  (1.549 m)   Wt 271 lb 6 oz (123.1 kg)   SpO2 94%   BMI 51.28 kg/m    General Medical Exam:   General:  Well appearing, morbidly obese, comfortable.   Eyes/ENT: see cranial nerve examination.   Neck: No masses appreciated.  Full range of motion without tenderness.  No carotid bruits. Respiratory:  Clear to auscultation, good air entry bilaterally.   Cardiac:  Regular rate and rhythm, no murmur.   Extremities:  Marked lymphedema, or skin discoloration.  Skin:  No rashes or lesions.  Neurological Exam: MENTAL STATUS including orientation to time, place, person, recent and remote memory, attention span and concentration, language, and fund of knowledge is normal.  Speech is not dysarthric.  CRANIAL  NERVES: II:  No visual field defects.  Unremarkable fundi.   III-IV-VI: Pupils equal round and reactive to light.  Normal conjugate, extra-ocular eye movements in all directions of gaze.  No nystagmus.  No ptosis.   V:  Normal facial sensation.   VII:  Normal facial symmetry and movements.  VIII:  Normal hearing and vestibular function.   IX-X:  Normal palatal movement.   XI:  Normal shoulder shrug and head rotation.   XII:  Normal tongue strength and range of motion, no deviation or fasciculation.  MOTOR:  No atrophy, fasciculations or abnormal movements.  No pronator drift.  Tone is normal.    Right Upper Extremity:    Left Upper Extremity:    Deltoid  5/5   Deltoid  5/5   Biceps  5/5   Biceps  5/5   Triceps  5/5   Triceps  5/5   Wrist extensors  5/5   Wrist extensors  5/5   Wrist flexors  5/5   Wrist flexors  5/5   Finger extensors  5/5   Finger extensors  5/5   Finger flexors  5/5   Finger flexors  5/5   Dorsal interossei  5/5   Dorsal interossei  5/5   Abductor pollicis  4/5   Abductor pollicis  5-/5   Tone (Ashworth scale)  0  Tone (Ashworth scale)  0   Right Lower Extremity:    Left Lower Extremity:    Hip flexors  5/5   Hip flexors  5/5   Knee extensors  5/5   Knee extensors  5/5   Dorsiflexors  5/5   Dorsiflexors  5/5   Plantarflexors  5/5   Plantarflexors  5/5   Tone (Ashworth scale)  0  Tone (Ashworth scale)  0   MSRs:  Right                                                                 Left brachioradialis 2+  brachioradialis 2+  biceps 2+  biceps 2+  triceps 2+  triceps 2+  patellar 1+  patellar 1+  ankle jerk 1+  ankle jerk 1+   SENSORY:  Absent temperature and pin prick over the median distribution on the right.  Positive Tinel's sign on the right, absent on the left.  Sensation elsewhere intact.  COORDINATION/GAIT: Normal finger-to- nose-finger.  Intact rapid alternating movements bilaterally. Gait is wide-based, assisted with rollator.  IMPRESSION: Right  carpal tunnel syndrome.    - She underwent NCS/EMG today which confirmed the diagnosis of right CTS, moderate in degree electrically.    - Start gabapentin 300mg  at bedtime x 1 week, then increase to 1 tablet twice daily for paresthesias  - Encouraged to start using a wrist splint  - Given that she has weakness on exam and moderate findings on electrodiagnostic testing, I will also refer her to orthopaedics for further management   Thank you for allowing me to participate in patient's care.  If I can answer any additional questions, I would be pleased to do so.    Sincerely,    Octivia Canion K. Allena Katz, DO

## 2017-12-10 DIAGNOSIS — M170 Bilateral primary osteoarthritis of knee: Secondary | ICD-10-CM | POA: Diagnosis not present

## 2017-12-10 DIAGNOSIS — Z7409 Other reduced mobility: Secondary | ICD-10-CM | POA: Diagnosis not present

## 2017-12-10 DIAGNOSIS — I890 Lymphedema, not elsewhere classified: Secondary | ICD-10-CM | POA: Diagnosis not present

## 2017-12-31 DIAGNOSIS — G5601 Carpal tunnel syndrome, right upper limb: Secondary | ICD-10-CM | POA: Diagnosis not present

## 2018-01-27 ENCOUNTER — Other Ambulatory Visit: Payer: Self-pay

## 2018-01-27 MED ORDER — LEVOTHYROXINE SODIUM 150 MCG PO TABS: 150.0000 ug | ORAL_TABLET | Freq: Every day | ORAL | 1 refills | Status: DC

## 2018-01-28 ENCOUNTER — Other Ambulatory Visit: Payer: Self-pay | Admitting: Neurology

## 2018-03-11 ENCOUNTER — Encounter (HOSPITAL_COMMUNITY): Payer: Self-pay | Admitting: Emergency Medicine

## 2018-03-11 ENCOUNTER — Other Ambulatory Visit: Payer: Self-pay

## 2018-03-11 ENCOUNTER — Emergency Department (HOSPITAL_COMMUNITY)
Admission: EM | Admit: 2018-03-11 | Discharge: 2018-03-12 | Disposition: A | Discharge status: Home / Self Care | Payer: Medicare Other | Attending: Emergency Medicine | Admitting: Emergency Medicine

## 2018-03-11 DIAGNOSIS — N3001 Acute cystitis with hematuria: Secondary | ICD-10-CM | POA: Diagnosis not present

## 2018-03-11 DIAGNOSIS — R109 Unspecified abdominal pain: Secondary | ICD-10-CM | POA: Diagnosis not present

## 2018-03-11 DIAGNOSIS — Z5321 Procedure and treatment not carried out due to patient leaving prior to being seen by health care provider: Secondary | ICD-10-CM | POA: Diagnosis not present

## 2018-03-11 DIAGNOSIS — N200 Calculus of kidney: Secondary | ICD-10-CM | POA: Diagnosis not present

## 2018-03-11 DIAGNOSIS — R1012 Left upper quadrant pain: Secondary | ICD-10-CM | POA: Diagnosis not present

## 2018-03-11 DIAGNOSIS — K130 Diseases of lips: Secondary | ICD-10-CM | POA: Diagnosis not present

## 2018-03-11 LAB — CBC
HCT: 39.1 % (ref 36.0–46.0)
Hemoglobin: 11.9 g/dL — ABNORMAL LOW (ref 12.0–15.0)
MCH: 26.1 pg (ref 26.0–34.0)
MCHC: 30.4 g/dL (ref 30.0–36.0)
MCV: 85.7 fL (ref 78.0–100.0)
Platelets: 337 10*3/uL (ref 150–400)
RBC: 4.56 MIL/uL (ref 3.87–5.11)
RDW: 15.8 % — AB (ref 11.5–15.5)
WBC: 8.1 10*3/uL (ref 4.0–10.5)

## 2018-03-11 LAB — URINALYSIS, ROUTINE W REFLEX MICROSCOPIC
Bilirubin Urine: NEGATIVE
GLUCOSE, UA: NEGATIVE mg/dL
Ketones, ur: NEGATIVE mg/dL
NITRITE: NEGATIVE
Protein, ur: NEGATIVE mg/dL
RBC / HPF: >50 RBC/hpf — ABNORMAL HIGH (ref 0–5)
Specific Gravity, Urine: 1.023 (ref 1.005–1.030)
pH: 5 (ref 5.0–8.0)

## 2018-03-11 LAB — COMPREHENSIVE METABOLIC PANEL WITH GFR
ALT: 18 U/L (ref 14–54)
AST: 15 U/L (ref 15–41)
Albumin: 3.5 g/dL (ref 3.5–5.0)
Alkaline Phosphatase: 73 U/L (ref 38–126)
Anion gap: 7 (ref 5–15)
BUN: 15 mg/dL (ref 6–20)
CO2: 26 mmol/L (ref 22–32)
Calcium: 9.3 mg/dL (ref 8.9–10.3)
Chloride: 108 mmol/L (ref 101–111)
Creatinine, Ser: 1.06 mg/dL — ABNORMAL HIGH (ref 0.44–1.00)
GFR calc Af Amer: >60 mL/min
GFR calc non Af Amer: 54 mL/min — ABNORMAL LOW
Glucose, Bld: 105 mg/dL — ABNORMAL HIGH (ref 65–99)
Potassium: 3.9 mmol/L (ref 3.5–5.1)
Sodium: 141 mmol/L (ref 135–145)
Total Bilirubin: 0.5 mg/dL (ref 0.3–1.2)
Total Protein: 7 g/dL (ref 6.5–8.1)

## 2018-03-11 LAB — LIPASE, BLOOD: Lipase: 37 U/L (ref 11–51)

## 2018-03-11 NOTE — ED Notes (Signed)
Patient turned labels into registration and left.

## 2018-03-11 NOTE — ED Triage Notes (Signed)
Pt c/o L sided abd pain onset 2 am, pt denies urinary s/s, denies n/v/d.

## 2018-03-12 DIAGNOSIS — L304 Erythema intertrigo: Secondary | ICD-10-CM | POA: Diagnosis not present

## 2018-03-13 DIAGNOSIS — M25561 Pain in right knee: Secondary | ICD-10-CM | POA: Diagnosis not present

## 2018-03-13 DIAGNOSIS — M25562 Pain in left knee: Secondary | ICD-10-CM | POA: Diagnosis not present

## 2018-03-14 DIAGNOSIS — N200 Calculus of kidney: Secondary | ICD-10-CM | POA: Diagnosis not present

## 2018-03-14 DIAGNOSIS — R319 Hematuria, unspecified: Secondary | ICD-10-CM | POA: Diagnosis not present

## 2018-03-27 DIAGNOSIS — N200 Calculus of kidney: Secondary | ICD-10-CM | POA: Diagnosis not present

## 2018-03-27 DIAGNOSIS — K8020 Calculus of gallbladder without cholecystitis without obstruction: Secondary | ICD-10-CM | POA: Diagnosis not present

## 2018-03-27 DIAGNOSIS — R319 Hematuria, unspecified: Secondary | ICD-10-CM | POA: Diagnosis not present

## 2018-04-01 ENCOUNTER — Other Ambulatory Visit: Payer: Self-pay | Admitting: Internal Medicine

## 2018-04-01 DIAGNOSIS — R3129 Other microscopic hematuria: Secondary | ICD-10-CM | POA: Diagnosis not present

## 2018-04-01 DIAGNOSIS — E785 Hyperlipidemia, unspecified: Secondary | ICD-10-CM | POA: Diagnosis not present

## 2018-04-01 DIAGNOSIS — Z23 Encounter for immunization: Secondary | ICD-10-CM | POA: Diagnosis not present

## 2018-04-01 DIAGNOSIS — E2839 Other primary ovarian failure: Secondary | ICD-10-CM | POA: Diagnosis not present

## 2018-04-01 DIAGNOSIS — R7303 Prediabetes: Secondary | ICD-10-CM | POA: Diagnosis not present

## 2018-04-01 DIAGNOSIS — E559 Vitamin D deficiency, unspecified: Secondary | ICD-10-CM | POA: Diagnosis not present

## 2018-04-01 DIAGNOSIS — Z0000 Encounter for general adult medical examination without abnormal findings: Secondary | ICD-10-CM | POA: Diagnosis not present

## 2018-04-01 DIAGNOSIS — Z1231 Encounter for screening mammogram for malignant neoplasm of breast: Secondary | ICD-10-CM

## 2018-04-01 DIAGNOSIS — E039 Hypothyroidism, unspecified: Secondary | ICD-10-CM | POA: Diagnosis not present

## 2018-04-01 DIAGNOSIS — Z1239 Encounter for other screening for malignant neoplasm of breast: Secondary | ICD-10-CM | POA: Diagnosis not present

## 2018-04-01 DIAGNOSIS — I10 Essential (primary) hypertension: Secondary | ICD-10-CM | POA: Diagnosis not present

## 2018-04-01 DIAGNOSIS — Z7189 Other specified counseling: Secondary | ICD-10-CM | POA: Diagnosis not present

## 2018-04-07 DIAGNOSIS — N200 Calculus of kidney: Secondary | ICD-10-CM | POA: Diagnosis not present

## 2018-04-07 DIAGNOSIS — N202 Calculus of kidney with calculus of ureter: Secondary | ICD-10-CM | POA: Diagnosis not present

## 2018-04-15 DIAGNOSIS — N200 Calculus of kidney: Secondary | ICD-10-CM | POA: Diagnosis not present

## 2018-04-22 DIAGNOSIS — M1712 Unilateral primary osteoarthritis, left knee: Secondary | ICD-10-CM | POA: Diagnosis not present

## 2018-04-22 DIAGNOSIS — M1711 Unilateral primary osteoarthritis, right knee: Secondary | ICD-10-CM | POA: Diagnosis not present

## 2018-04-25 ENCOUNTER — Other Ambulatory Visit: Payer: Self-pay | Admitting: Internal Medicine

## 2018-05-01 ENCOUNTER — Other Ambulatory Visit: Payer: Self-pay | Admitting: Neurology

## 2018-05-07 HISTORY — PX: URETEROSCOPY WITH HOLMIUM LASER LITHOTRIPSY: SHX6645

## 2018-05-08 DIAGNOSIS — N200 Calculus of kidney: Secondary | ICD-10-CM | POA: Diagnosis not present

## 2018-05-08 DIAGNOSIS — R7303 Prediabetes: Secondary | ICD-10-CM | POA: Diagnosis not present

## 2018-05-08 DIAGNOSIS — N132 Hydronephrosis with renal and ureteral calculous obstruction: Secondary | ICD-10-CM | POA: Diagnosis not present

## 2018-05-08 DIAGNOSIS — E079 Disorder of thyroid, unspecified: Secondary | ICD-10-CM | POA: Diagnosis not present

## 2018-05-08 DIAGNOSIS — E119 Type 2 diabetes mellitus without complications: Secondary | ICD-10-CM | POA: Diagnosis not present

## 2018-05-08 DIAGNOSIS — Z6841 Body Mass Index (BMI) 40.0 and over, adult: Secondary | ICD-10-CM | POA: Diagnosis not present

## 2018-05-08 DIAGNOSIS — I10 Essential (primary) hypertension: Secondary | ICD-10-CM | POA: Diagnosis not present

## 2018-05-08 DIAGNOSIS — Z79899 Other long term (current) drug therapy: Secondary | ICD-10-CM | POA: Diagnosis not present

## 2018-05-08 DIAGNOSIS — D72829 Elevated white blood cell count, unspecified: Secondary | ICD-10-CM | POA: Diagnosis not present

## 2018-05-08 DIAGNOSIS — K769 Liver disease, unspecified: Secondary | ICD-10-CM | POA: Diagnosis not present

## 2018-05-08 DIAGNOSIS — E785 Hyperlipidemia, unspecified: Secondary | ICD-10-CM | POA: Diagnosis not present

## 2018-05-08 DIAGNOSIS — Z79891 Long term (current) use of opiate analgesic: Secondary | ICD-10-CM | POA: Diagnosis not present

## 2018-05-08 DIAGNOSIS — E039 Hypothyroidism, unspecified: Secondary | ICD-10-CM | POA: Diagnosis not present

## 2018-05-08 DIAGNOSIS — J45909 Unspecified asthma, uncomplicated: Secondary | ICD-10-CM | POA: Diagnosis not present

## 2018-05-08 DIAGNOSIS — Z7982 Long term (current) use of aspirin: Secondary | ICD-10-CM | POA: Diagnosis not present

## 2018-05-08 DIAGNOSIS — Z7984 Long term (current) use of oral hypoglycemic drugs: Secondary | ICD-10-CM | POA: Diagnosis not present

## 2018-05-08 DIAGNOSIS — R109 Unspecified abdominal pain: Secondary | ICD-10-CM | POA: Diagnosis not present

## 2018-05-08 DIAGNOSIS — K449 Diaphragmatic hernia without obstruction or gangrene: Secondary | ICD-10-CM | POA: Diagnosis not present

## 2018-05-09 DIAGNOSIS — K449 Diaphragmatic hernia without obstruction or gangrene: Secondary | ICD-10-CM | POA: Diagnosis not present

## 2018-05-09 DIAGNOSIS — N132 Hydronephrosis with renal and ureteral calculous obstruction: Secondary | ICD-10-CM | POA: Diagnosis not present

## 2018-05-09 DIAGNOSIS — R109 Unspecified abdominal pain: Secondary | ICD-10-CM | POA: Diagnosis not present

## 2018-05-09 DIAGNOSIS — K769 Liver disease, unspecified: Secondary | ICD-10-CM | POA: Diagnosis not present

## 2018-05-13 DIAGNOSIS — M1711 Unilateral primary osteoarthritis, right knee: Secondary | ICD-10-CM | POA: Diagnosis not present

## 2018-05-13 DIAGNOSIS — M1712 Unilateral primary osteoarthritis, left knee: Secondary | ICD-10-CM | POA: Diagnosis not present

## 2018-05-15 DIAGNOSIS — N200 Calculus of kidney: Secondary | ICD-10-CM | POA: Diagnosis not present

## 2018-05-15 DIAGNOSIS — R070 Pain in throat: Secondary | ICD-10-CM | POA: Diagnosis not present

## 2018-05-15 DIAGNOSIS — G5601 Carpal tunnel syndrome, right upper limb: Secondary | ICD-10-CM | POA: Diagnosis not present

## 2018-05-15 DIAGNOSIS — M79605 Pain in left leg: Secondary | ICD-10-CM | POA: Diagnosis not present

## 2018-05-15 DIAGNOSIS — R3129 Other microscopic hematuria: Secondary | ICD-10-CM | POA: Diagnosis not present

## 2018-05-16 ENCOUNTER — Ambulatory Visit: Payer: Medicare Other

## 2018-05-16 ENCOUNTER — Ambulatory Visit
Admission: RE | Admit: 2018-05-16 | Discharge: 2018-05-16 | Disposition: A | Discharge status: Home / Self Care | Payer: BLUE CROSS/BLUE SHIELD | Source: Ambulatory Visit | Attending: Internal Medicine | Admitting: Internal Medicine

## 2018-05-16 DIAGNOSIS — Z1231 Encounter for screening mammogram for malignant neoplasm of breast: Secondary | ICD-10-CM

## 2018-05-16 DIAGNOSIS — E2839 Other primary ovarian failure: Secondary | ICD-10-CM

## 2018-05-16 DIAGNOSIS — Z13820 Encounter for screening for osteoporosis: Secondary | ICD-10-CM | POA: Diagnosis not present

## 2018-05-20 DIAGNOSIS — M1711 Unilateral primary osteoarthritis, right knee: Secondary | ICD-10-CM | POA: Diagnosis not present

## 2018-05-20 DIAGNOSIS — M1712 Unilateral primary osteoarthritis, left knee: Secondary | ICD-10-CM | POA: Diagnosis not present

## 2018-05-21 DIAGNOSIS — N200 Calculus of kidney: Secondary | ICD-10-CM | POA: Diagnosis not present

## 2018-05-22 ENCOUNTER — Ambulatory Visit (INDEPENDENT_AMBULATORY_CARE_PROVIDER_SITE_OTHER): Payer: Medicare Other | Admitting: Internal Medicine

## 2018-05-22 VITALS — BP 124/78 | HR 81 | Ht 61.0 in | Wt 319.8 lb

## 2018-05-22 DIAGNOSIS — E785 Hyperlipidemia, unspecified: Secondary | ICD-10-CM | POA: Diagnosis not present

## 2018-05-22 DIAGNOSIS — E039 Hypothyroidism, unspecified: Secondary | ICD-10-CM | POA: Diagnosis not present

## 2018-05-22 DIAGNOSIS — R7303 Prediabetes: Secondary | ICD-10-CM | POA: Diagnosis not present

## 2018-05-22 LAB — HEMOGLOBIN A1C: Hemoglobin A1C: 5.8

## 2018-05-22 NOTE — Patient Instructions (Addendum)
Please continue: - Metformin 500 mg 2x a day with meals.  Continue Levothyroxine 150 mcg daily.  Take the thyroid hormone every day, with water, at least 30 minutes before breakfast, separated by at least 4 hours from: - acid reflux medications - calcium - iron - multivitamins  Please stop at the lab.  Please come back for a follow-up appointment in 6 months.

## 2018-05-22 NOTE — Progress Notes (Signed)
Patient ID: Jennifer Huffman, female   DOB: 26-Dec-1950, 67 y.o.   MRN: [id number]  HPI: Jennifer Huffman is a 67 y.o.-year-old female, returning for f/u for prediabetes, dx in ~2014, diet-controlled uncontrolled, without complications. [place]. Last visit 6 months ago.  She has a kidney stone removed since last visit - 05/08/2018.   Prediabetes: Last hemoglobin A1c was: Lab Results  Component Value Date   HGBA1C 6.1 11/15/2017   HGBA1C 6.2 05/16/2017   HGBA1C 6.1 11/16/2016  10/31/2015: HbA1c 6.5%  Pt is on: - Metformin 500 mg twice a day >> started 08/2017.  No GI side effects.  She is still not checking sugars at home. She has a FreeStyle Libre CGM >> irritates her skin >> stopped using it.  Glucometer: [employer] Technology[employer] Contour Next  - No history of CKD, last BUN/creatinine:  05/08/2018: 16.4/1.10, ACR 1.0 Lab Results  Component Value Date   BUN 15 03/11/2018   Lab Results  Component Value Date   CREATININE 1.06 (H) 03/11/2018  07/03/2016: 11/0.83, glucose 98 10/31/2015: 16/0.87, Glucose 113 On telmisartan 80.  -+ HL; last set of lipids: Lab Results  Component Value Date   CHOL 179 12/21/2016   HDL 51.60 12/21/2016   LDLCALC 102 (H) 12/21/2016   TRIG 128.0 12/21/2016   CHOLHDL 3 12/21/2016  10/31/2015: 175/82/50/104 On atorvastatin.  - last eye exam 08/2017: No DR, + cataract  - Denies numbness and tingling in her feet.  On ASA 81.  She has a history of HTN, lymphedema, osteoarthritis of knees, mild intermittent asthma, eczema.   Hypothyroidism:  Last TSH level normal: Lab Results  Component Value Date   TSH 3.08 07/04/2017   TSH 1.27 12/21/2016   03/06/2016: TSH 4.74 10/31/2015: TSH 4.36 (0.34-4.5)  Pt is on levothyroxine 150 mcg daily, taken: - in am - fasting - at least 30 min from b'fast - no Ca, Fe, PPIs - + MVI later in the day - not on Biotin   ROS: Constitutional: no weight gain/no weight loss, no fatigue, no  subjective hyperthermia, no subjective hypothermia Eyes: no blurry vision, no xerophthalmia ENT: no sore throat, + see HPI Cardiovascular: no CP/+ SOB/no palpitations/no leg swelling Respiratory: no cough/+ SOB/+ wheezing Gastrointestinal: no N/no V/no D/no C/no acid reflux Musculoskeletal: no muscle aches/no joint aches Skin: no rashes, no hair loss Neurological: no tremors/no numbness/no tingling/no dizziness  I reviewed pt's medications, allergies, PMH, social hx, family hx, and changes were documented in the history of present illness. Otherwise, unchanged from my initial visit note.  Past Medical History:  Diagnosis Date  . Asthma, mild intermittent   . Blurred vision, bilateral   . Diabetes mellitus without complication (HCC)    type 2  . Dyspnea    with exertion  . Hyperlipidemia   . Hypertension   . Hypothyroidism   . Lymphedema of both lower extremities    compressions hose when out anf wraps at home  . Neuromuscular disorder (HCC)   . Osteoarthritis of both knees    Past Surgical History:  Procedure Laterality Date  . ABDOMINAL HYSTERECTOMY     partial  . COLONOSCOPY WITH PROPOFOL N/A 01/17/2017   Procedure: COLONOSCOPY WITH PROPOFOL;  Surgeon: Charlott RakesVincent Schooler, MD;  Location: [facility];  Service: Endoscopy;  Laterality: N/A;   Social History   Social History  . Marital Status: Legally Separated    Spouse Name: N/A  . Number of Children: 2   Occupational History  . retired  Social History Main Topics  . Smoking status: Never Smoker   . Smokeless tobacco: Never Used  . Alcohol Use: Vodka 1-2 drinks socially  . Drug Use: No   Current Outpatient Medications on File Prior to Visit  Medication Sig Dispense Refill  . albuterol (PROVENTIL HFA;VENTOLIN HFA) 108 (90 Base) MCG/ACT inhaler Inhale 2 puffs into the lungs every 4 (four) hours as needed for wheezing or shortness of breath.    Marland Kitchen amLODipine (NORVASC) 5 MG tablet Take 5 mg by mouth daily.    Marland Kitchen  aspirin 81 MG tablet Take 81 mg by mouth daily.    Marland Kitchen BAYER MICROLET LANCETS lancets Use as instructed used to check one time a day 100 each 5  . Continuous Blood Gluc Receiver (FREESTYLE LIBRE READER) DEVI 1 Device by Does not apply route 3 (three) times daily. 1 Device 1  . Continuous Blood Gluc Sensor (FREESTYLE LIBRE SENSOR SYSTEM) MISC 1 Device by Does not apply route every 30 (thirty) days. 3 each 11  . Fluticasone-Salmeterol (ADVAIR) 250-50 MCG/DOSE AEPB Inhale 1 puff into the lungs 2 (two) times daily.     . furosemide (LASIX) 40 MG tablet Take 40 mg by mouth daily.    Marland Kitchen gabapentin (NEURONTIN) 300 MG capsule START 1 CAPSULE AT BEDTIME FOR 1 TIME A WEEK, THEN INCREASE TO 1 TABLET TWICE DAILY 180 capsule 2  . glucose blood (BAYER CONTOUR NEXT TEST) test strip Use as instructed testing one time a day. 100 each 5  . levothyroxine (SYNTHROID, LEVOTHROID) 150 MCG tablet Take 1 tablet (150 mcg total) by mouth daily before breakfast. 90 tablet 1  . metFORMIN (GLUCOPHAGE) 500 MG tablet TAKE 1 TABLET BY MOUTH TWICE A DAY WITH A MEAL 180 tablet 2  . Multiple Vitamins-Minerals (MULTIVITAMIN WITH MINERALS) tablet Take 1 tablet by mouth daily.    . potassium chloride (KLOR-CON 10) 10 MEQ tablet Take 20 mEq by mouth daily.     Marland Kitchen telmisartan (MICARDIS) 80 MG tablet Take 80 mg by mouth daily.    . traMADol (ULTRAM) 50 MG tablet Take 1 tablet (50 mg total) by mouth every 6 (six) hours as needed. 30 tablet 0   No current facility-administered medications on file prior to visit.    No Known Allergies Family History  Problem Relation Age of Onset  . Cancer Mother        breast  . Breast cancer Mother 19  . Hypertension Father   . Breast cancer Sister 57  . Breast cancer Sister 28   PE: BP 124/78 (BP Location: Left Arm, Patient Position: Sitting, Cuff Size: Large)   Pulse 81   Ht 5\' 1"  (1.549 m)   Wt (!) 319 lb 12.8 oz (145.1 kg)   SpO2 94%   BMI 60.43 kg/m  Wt Readings from Last 3 Encounters:   05/22/18 (!) 319 lb 12.8 oz (145.1 kg)  03/11/18 290 lb (131.5 kg)  12/06/17 271 lb 6 oz (123.1 kg)   Constitutional: overweight, in NAD, walks with a walker Eyes: PERRLA, EOMI, no exophthalmos ENT: moist mucous membranes, no thyromegaly, no cervical lymphadenopathy Cardiovascular: RRR, No MRG Respiratory: CTA B Gastrointestinal: abdomen soft, NT, ND, BS+ Musculoskeletal: no deformities, strength intact in all 4 Skin: moist, warm, no rashes Neurological: no tremor with outstretched hands, DTR normal in all 4  ASSESSMENT: 1. Prediabetes  2. Hypothyroidism  3.  HL  PLAN:  1. Patient with history of prediabetes, with latest HbA1c of 6.1%, decreased from 6.2%.  She  continues on metformin 500 mg twice a day, which appears to work well for her.  She is still not checking sugars at home which I repeatedly asked her to do. -She continues to go to the gym consistently -We will continue metformin - I suggested to:  Patient Instructions  Please continue: - Metformin 500 mg 2x a day with meals.  Continue Levothyroxine 150 mcg daily.  Take the thyroid hormone every day, with water, at least 30 minutes before breakfast, separated by at least 4 hours from: - acid reflux medications - calcium - iron - multivitamins  Please come back for a follow-up appointment in 6 months.  - today, HbA1c is 5.8% (improved!) - continue checking sugars at different times of the day - check 1x a day, rotating checks - advised for yearly eye exams >> she is UTD - Return to clinic in 3 mo with sugar log    2. Hypothyroidism - latest thyroid labs reviewed with pt >> normal in 07/2017 - she continues on LT4 150 mcg daily - pt feels good on this dose. - we discussed about taking the thyroid hormone every day, with water, >30 minutes before breakfast, separated by >4 hours from acid reflux medications, calcium, iron, multivitamins. Pt. is taking it correctly. - will check thyroid tests today: TSH and  fT4 - If labs are abnormal, she will need to return for repeat TFTs in 1.5 months  3.  HL - Reviewed latest lipid panel from 2018: LDL slightly high Lab Results  Component Value Date   CHOL 179 12/21/2016   HDL 51.60 12/21/2016   LDLCALC 102 (H) 12/21/2016   TRIG 128.0 12/21/2016   CHOLHDL 3 12/21/2016  - Continues atorvastatin without side effects. - Recheck today  Carlus Pavlov, MD PhD [facility] Endocrinology

## 2018-06-03 DIAGNOSIS — M1712 Unilateral primary osteoarthritis, left knee: Secondary | ICD-10-CM | POA: Diagnosis not present

## 2018-06-03 DIAGNOSIS — M1711 Unilateral primary osteoarthritis, right knee: Secondary | ICD-10-CM | POA: Diagnosis not present

## 2018-06-04 DIAGNOSIS — N200 Calculus of kidney: Secondary | ICD-10-CM | POA: Diagnosis not present

## 2018-06-24 DIAGNOSIS — N200 Calculus of kidney: Secondary | ICD-10-CM | POA: Diagnosis not present

## 2018-06-25 ENCOUNTER — Encounter: Payer: Self-pay | Admitting: Internal Medicine

## 2018-06-30 ENCOUNTER — Observation Stay (HOSPITAL_COMMUNITY)
Admission: EM | Admit: 2018-06-30 | Discharge: 2018-07-01 | Disposition: A | Discharge status: Home / Self Care | Payer: Medicare Other | Attending: Urology | Admitting: Urology

## 2018-06-30 ENCOUNTER — Observation Stay (HOSPITAL_COMMUNITY): Payer: Medicare Other | Admitting: Anesthesiology

## 2018-06-30 ENCOUNTER — Encounter (HOSPITAL_COMMUNITY)
Admission: EM | Disposition: A | Discharge status: Home / Self Care | Payer: Self-pay | Source: Home / Self Care | Attending: Emergency Medicine

## 2018-06-30 ENCOUNTER — Encounter (HOSPITAL_COMMUNITY): Payer: Self-pay | Admitting: Emergency Medicine

## 2018-06-30 ENCOUNTER — Observation Stay (HOSPITAL_COMMUNITY): Payer: Medicare Other

## 2018-06-30 ENCOUNTER — Other Ambulatory Visit: Payer: Self-pay | Admitting: Urology

## 2018-06-30 ENCOUNTER — Other Ambulatory Visit: Payer: Self-pay

## 2018-06-30 ENCOUNTER — Emergency Department (HOSPITAL_COMMUNITY): Payer: Medicare Other

## 2018-06-30 DIAGNOSIS — K5730 Diverticulosis of large intestine without perforation or abscess without bleeding: Secondary | ICD-10-CM | POA: Diagnosis not present

## 2018-06-30 DIAGNOSIS — E039 Hypothyroidism, unspecified: Secondary | ICD-10-CM | POA: Insufficient documentation

## 2018-06-30 DIAGNOSIS — N179 Acute kidney failure, unspecified: Secondary | ICD-10-CM | POA: Diagnosis not present

## 2018-06-30 DIAGNOSIS — E785 Hyperlipidemia, unspecified: Secondary | ICD-10-CM | POA: Insufficient documentation

## 2018-06-30 DIAGNOSIS — E669 Obesity, unspecified: Secondary | ICD-10-CM | POA: Insufficient documentation

## 2018-06-30 DIAGNOSIS — R52 Pain, unspecified: Secondary | ICD-10-CM | POA: Diagnosis not present

## 2018-06-30 DIAGNOSIS — I700 Atherosclerosis of aorta: Secondary | ICD-10-CM | POA: Insufficient documentation

## 2018-06-30 DIAGNOSIS — Z87442 Personal history of urinary calculi: Secondary | ICD-10-CM | POA: Insufficient documentation

## 2018-06-30 DIAGNOSIS — N200 Calculus of kidney: Secondary | ICD-10-CM

## 2018-06-30 DIAGNOSIS — E119 Type 2 diabetes mellitus without complications: Secondary | ICD-10-CM | POA: Insufficient documentation

## 2018-06-30 DIAGNOSIS — K449 Diaphragmatic hernia without obstruction or gangrene: Secondary | ICD-10-CM | POA: Diagnosis not present

## 2018-06-30 DIAGNOSIS — R1031 Right lower quadrant pain: Secondary | ICD-10-CM | POA: Diagnosis not present

## 2018-06-30 DIAGNOSIS — N202 Calculus of kidney with calculus of ureter: Principal | ICD-10-CM | POA: Insufficient documentation

## 2018-06-30 DIAGNOSIS — Z7982 Long term (current) use of aspirin: Secondary | ICD-10-CM | POA: Diagnosis not present

## 2018-06-30 DIAGNOSIS — N201 Calculus of ureter: Secondary | ICD-10-CM | POA: Diagnosis not present

## 2018-06-30 DIAGNOSIS — R109 Unspecified abdominal pain: Secondary | ICD-10-CM | POA: Diagnosis not present

## 2018-06-30 DIAGNOSIS — I491 Atrial premature depolarization: Secondary | ICD-10-CM | POA: Diagnosis not present

## 2018-06-30 DIAGNOSIS — R42 Dizziness and giddiness: Secondary | ICD-10-CM | POA: Diagnosis not present

## 2018-06-30 DIAGNOSIS — K760 Fatty (change of) liver, not elsewhere classified: Secondary | ICD-10-CM | POA: Insufficient documentation

## 2018-06-30 DIAGNOSIS — Z6841 Body Mass Index (BMI) 40.0 and over, adult: Secondary | ICD-10-CM | POA: Diagnosis not present

## 2018-06-30 DIAGNOSIS — Z7984 Long term (current) use of oral hypoglycemic drugs: Secondary | ICD-10-CM | POA: Diagnosis not present

## 2018-06-30 DIAGNOSIS — R0902 Hypoxemia: Secondary | ICD-10-CM | POA: Diagnosis not present

## 2018-06-30 DIAGNOSIS — I10 Essential (primary) hypertension: Secondary | ICD-10-CM | POA: Insufficient documentation

## 2018-06-30 DIAGNOSIS — Z79899 Other long term (current) drug therapy: Secondary | ICD-10-CM | POA: Insufficient documentation

## 2018-06-30 HISTORY — PX: CYSTOSCOPY/RETROGRADE/URETEROSCOPY/STONE EXTRACTION WITH BASKET: SHX5317

## 2018-06-30 HISTORY — PX: HOLMIUM LASER APPLICATION: SHX5852

## 2018-06-30 LAB — URINALYSIS, ROUTINE W REFLEX MICROSCOPIC
BILIRUBIN URINE: NEGATIVE
Glucose, UA: NEGATIVE mg/dL
HGB URINE DIPSTICK: NEGATIVE
Ketones, ur: 20 mg/dL — AB
Leukocytes, UA: NEGATIVE
NITRITE: NEGATIVE
Protein, ur: 100 mg/dL — AB
SPECIFIC GRAVITY, URINE: 1.014 (ref 1.005–1.030)
pH: 8 (ref 5.0–8.0)

## 2018-06-30 LAB — BASIC METABOLIC PANEL
Anion gap: 12 (ref 5–15)
BUN: 18 mg/dL (ref 8–23)
CO2: 22 mmol/L (ref 22–32)
CREATININE: 1.34 mg/dL — AB (ref 0.44–1.00)
Calcium: 9.5 mg/dL (ref 8.9–10.3)
Chloride: 103 mmol/L (ref 98–111)
GFR calc Af Amer: 47 mL/min — ABNORMAL LOW (ref >60)
GFR calc non Af Amer: 41 mL/min — ABNORMAL LOW (ref >60)
GLUCOSE: 149 mg/dL — AB (ref 70–99)
Potassium: 4.1 mmol/L (ref 3.5–5.1)
SODIUM: 137 mmol/L (ref 135–145)

## 2018-06-30 LAB — HEPATIC FUNCTION PANEL
ALBUMIN: 4.2 g/dL (ref 3.5–5.0)
ALT: 19 U/L (ref 0–44)
AST: 21 U/L (ref 15–41)
Alkaline Phosphatase: 81 U/L (ref 38–126)
BILIRUBIN INDIRECT: 0.6 mg/dL (ref 0.3–0.9)
Bilirubin, Direct: 0.1 mg/dL (ref 0.0–0.2)
Total Bilirubin: 0.7 mg/dL (ref 0.3–1.2)
Total Protein: 8.5 g/dL — ABNORMAL HIGH (ref 6.5–8.1)

## 2018-06-30 LAB — LIPASE, BLOOD: LIPASE: 35 U/L (ref 11–51)

## 2018-06-30 LAB — SURGICAL PCR SCREEN
MRSA, PCR: NEGATIVE
Staphylococcus aureus: NEGATIVE

## 2018-06-30 LAB — CBC
HCT: 41.8 % (ref 36.0–46.0)
Hemoglobin: 12.6 g/dL (ref 12.0–15.0)
MCH: 26.4 pg (ref 26.0–34.0)
MCHC: 30.1 g/dL (ref 30.0–36.0)
MCV: 87.6 fL (ref 78.0–100.0)
PLATELETS: 331 10*3/uL (ref 150–400)
RBC: 4.77 MIL/uL (ref 3.87–5.11)
RDW: 15.9 % — ABNORMAL HIGH (ref 11.5–15.5)
WBC: 12.3 10*3/uL — AB (ref 4.0–10.5)

## 2018-06-30 LAB — GLUCOSE, CAPILLARY
GLUCOSE-CAPILLARY: 146 mg/dL — AB (ref 70–99)
Glucose-Capillary: 128 mg/dL — ABNORMAL HIGH (ref 70–99)

## 2018-06-30 SURGERY — CYSTOSCOPY, WITH CALCULUS REMOVAL USING BASKET
Anesthesia: General | Site: Ureter | Laterality: Right

## 2018-06-30 MED ORDER — FENTANYL CITRATE (PF) 100 MCG/2ML IJ SOLN
50.0000 ug | Freq: Once | INTRAMUSCULAR | Status: AC
  Administered 2018-06-30: 50 ug via INTRAVENOUS
  Filled 2018-06-30: qty 2

## 2018-06-30 MED ORDER — PROCHLORPERAZINE EDISYLATE 10 MG/2ML IJ SOLN
10.0000 mg | Freq: Once | INTRAMUSCULAR | Status: AC
  Administered 2018-06-30: 10 mg via INTRAVENOUS
  Filled 2018-06-30: qty 2

## 2018-06-30 MED ORDER — CEFAZOLIN SODIUM-DEXTROSE 2-4 GM/100ML-% IV SOLN
INTRAVENOUS | Status: AC
  Filled 2018-06-30: qty 100

## 2018-06-30 MED ORDER — ONDANSETRON HCL 4 MG/2ML IJ SOLN
4.0000 mg | Freq: Once | INTRAMUSCULAR | Status: AC
  Administered 2018-06-30: 4 mg via INTRAVENOUS
  Filled 2018-06-30: qty 2

## 2018-06-30 MED ORDER — SUCCINYLCHOLINE CHLORIDE 200 MG/10ML IV SOSY
PREFILLED_SYRINGE | INTRAVENOUS | Status: DC | PRN
  Administered 2018-06-30: 150 mg via INTRAVENOUS

## 2018-06-30 MED ORDER — SODIUM CHLORIDE 0.9 % IR SOLN
Status: DC | PRN
  Administered 2018-06-30: 3000 mL

## 2018-06-30 MED ORDER — PHENYLEPHRINE 40 MCG/ML (10ML) SYRINGE FOR IV PUSH (FOR BLOOD PRESSURE SUPPORT)
PREFILLED_SYRINGE | INTRAVENOUS | Status: AC
  Filled 2018-06-30: qty 10

## 2018-06-30 MED ORDER — LIDOCAINE 2% (20 MG/ML) 5 ML SYRINGE
INTRAMUSCULAR | Status: AC
  Filled 2018-06-30: qty 5

## 2018-06-30 MED ORDER — ONDANSETRON HCL 4 MG/2ML IJ SOLN
INTRAMUSCULAR | Status: DC | PRN
  Administered 2018-06-30: 4 mg via INTRAVENOUS

## 2018-06-30 MED ORDER — CEFAZOLIN SODIUM-DEXTROSE 2-4 GM/100ML-% IV SOLN
2.0000 g | INTRAVENOUS | Status: AC
  Administered 2018-06-30: 2 g via INTRAVENOUS

## 2018-06-30 MED ORDER — FENTANYL CITRATE (PF) 100 MCG/2ML IJ SOLN
INTRAMUSCULAR | Status: AC
  Filled 2018-06-30: qty 2

## 2018-06-30 MED ORDER — FENTANYL CITRATE (PF) 100 MCG/2ML IJ SOLN: 50.0000 ug | INTRAMUSCULAR | Status: DC | PRN

## 2018-06-30 MED ORDER — ONDANSETRON HCL 4 MG/2ML IJ SOLN
INTRAMUSCULAR | Status: AC
  Filled 2018-06-30: qty 2

## 2018-06-30 MED ORDER — ONDANSETRON HCL 4 MG/2ML IJ SOLN: 4.0000 mg | Freq: Four times a day (QID) | INTRAMUSCULAR | Status: DC | PRN

## 2018-06-30 MED ORDER — FENTANYL CITRATE (PF) 100 MCG/2ML IJ SOLN
INTRAMUSCULAR | Status: DC | PRN
  Administered 2018-06-30: 100 ug via INTRAVENOUS

## 2018-06-30 MED ORDER — PROPOFOL 10 MG/ML IV BOLUS
INTRAVENOUS | Status: DC | PRN
  Administered 2018-06-30: 280 mg via INTRAVENOUS

## 2018-06-30 MED ORDER — SUCCINYLCHOLINE CHLORIDE 200 MG/10ML IV SOSY
PREFILLED_SYRINGE | INTRAVENOUS | Status: AC
  Filled 2018-06-30: qty 10

## 2018-06-30 MED ORDER — DEXAMETHASONE SODIUM PHOSPHATE 10 MG/ML IJ SOLN
INTRAMUSCULAR | Status: DC | PRN
  Administered 2018-06-30: 5 mg via INTRAVENOUS

## 2018-06-30 MED ORDER — LIDOCAINE 2% (20 MG/ML) 5 ML SYRINGE
INTRAMUSCULAR | Status: DC | PRN
  Administered 2018-06-30: 100 mg via INTRAVENOUS

## 2018-06-30 MED ORDER — DEXAMETHASONE SODIUM PHOSPHATE 10 MG/ML IJ SOLN
INTRAMUSCULAR | Status: AC
  Filled 2018-06-30: qty 1

## 2018-06-30 MED ORDER — PHENYLEPHRINE 40 MCG/ML (10ML) SYRINGE FOR IV PUSH (FOR BLOOD PRESSURE SUPPORT)
PREFILLED_SYRINGE | INTRAVENOUS | Status: DC | PRN
  Administered 2018-06-30 (×5): 120 ug via INTRAVENOUS
  Administered 2018-06-30: 80 ug via INTRAVENOUS

## 2018-06-30 MED ORDER — LACTATED RINGERS IV BOLUS
1000.0000 mL | Freq: Once | INTRAVENOUS | Status: AC
  Administered 2018-06-30: 1000 mL via INTRAVENOUS

## 2018-06-30 MED ORDER — LACTATED RINGERS IV SOLN
INTRAVENOUS | Status: DC | PRN
  Administered 2018-06-30 (×2): via INTRAVENOUS

## 2018-06-30 SURGICAL SUPPLY — 25 items
BAG URO CATCHER STRL LF (MISCELLANEOUS) ×4 IMPLANT
BASKET STONE NCOMPASS (UROLOGICAL SUPPLIES) IMPLANT
CATH URET 5FR 28IN OPEN ENDED (CATHETERS) IMPLANT
CATH URET DUAL LUMEN 6-10FR 50 (CATHETERS) ×4 IMPLANT
CLOTH BEACON ORANGE TIMEOUT ST (SAFETY) ×4 IMPLANT
COVER SURGICAL LIGHT HANDLE (MISCELLANEOUS) ×4 IMPLANT
EXTRACTOR STONE NITINOL NGAGE (UROLOGICAL SUPPLIES) ×4 IMPLANT
FIBER LASER FLEXIVA 1000 (UROLOGICAL SUPPLIES) IMPLANT
FIBER LASER FLEXIVA 365 (UROLOGICAL SUPPLIES) IMPLANT
FIBER LASER FLEXIVA 550 (UROLOGICAL SUPPLIES) IMPLANT
FIBER LASER TRAC TIP (UROLOGICAL SUPPLIES) IMPLANT
GLOVE SURG SS PI 8.0 STRL IVOR (GLOVE) IMPLANT
GOWN STRL REUS W/TWL XL LVL3 (GOWN DISPOSABLE) ×4 IMPLANT
GUIDEWIRE STR DUAL SENSOR (WIRE) ×4 IMPLANT
IV NS 1000ML (IV SOLUTION) ×2
IV NS 1000ML BAXH (IV SOLUTION) ×2 IMPLANT
IV NS IRRIG 3000ML ARTHROMATIC (IV SOLUTION) ×4 IMPLANT
MANIFOLD NEPTUNE II (INSTRUMENTS) ×4 IMPLANT
PACK CYSTO (CUSTOM PROCEDURE TRAY) ×4 IMPLANT
SHEATH URETERAL 12FRX35CM (MISCELLANEOUS) ×4 IMPLANT
STENT URET 6FRX24 CONTOUR (STENTS) ×4 IMPLANT
TRAY FOLEY MTR SLVR 16FR STAT (SET/KITS/TRAYS/PACK) ×4 IMPLANT
TUBING CONNECTING 10 (TUBING) ×3 IMPLANT
TUBING CONNECTING 10' (TUBING) ×1
TUBING UROLOGY SET (TUBING) ×4 IMPLANT

## 2018-06-30 NOTE — ED Provider Notes (Signed)
[facility] EMERGENCY DEPARTMENT Provider Note   CSN: [id number] Arrival date & time: 06/30/18  1018     History   Chief Complaint Chief Complaint  Patient presents with  . Flank Pain    HPI Jennifer Huffman is a 67 y.o. female.  The history is provided by the patient.  Abdominal Pain   This is a new problem. The current episode started yesterday. The problem occurs constantly. The problem has not changed since onset.Associated with: hx of kidney stones. Pain location: right flank. The quality of the pain is aching and dull. The pain is at a severity of 5/10. The pain is moderate. Associated symptoms include anorexia, nausea and vomiting. Pertinent negatives include fever, belching, diarrhea, flatus, hematochezia, melena, constipation, dysuria, frequency, hematuria, headaches, arthralgias and myalgias. Nothing aggravates the symptoms. Nothing relieves the symptoms. Past workup comments: kidney stones on left side in the past. Her past medical history does not include gallstones.    Past Medical History:  Diagnosis Date  . Asthma, mild intermittent   . Blurred vision, bilateral   . Diabetes mellitus without complication (HCC)    type 2  . Dyspnea    with exertion  . Hyperlipidemia   . Hypertension   . Hypothyroidism   . Lymphedema of both lower extremities    compressions hose when out anf wraps at home  . Neuromuscular disorder (HCC)   . Osteoarthritis of both knees     Patient Active Problem List   Diagnosis Date Noted  . Kidney stone 06/30/2018  . Hyperlipidemia 05/22/2018  . Special screening for malignant neoplasms, colon 01/17/2017  . Class 3 obesity with body mass index (BMI) of 60.0 to 69.9 in adult 11/16/2016  . Hypothyroidism 11/16/2016  . Prediabetes 02/20/2016  . Lymphedema 12/16/2015    Past Surgical History:  Procedure Laterality Date  . ABDOMINAL HYSTERECTOMY     partial  . COLONOSCOPY WITH PROPOFOL N/A 01/17/2017   Procedure:  COLONOSCOPY WITH PROPOFOL;  Surgeon: Charlott Rakes, MD;  Location: [facility];  Service: Endoscopy;  Laterality: N/A;     OB History   None      Home Medications    Prior to Admission medications   Medication Sig Start Date End Date Taking? Authorizing Provider  albuterol (PROVENTIL HFA;VENTOLIN HFA) 108 (90 Base) MCG/ACT inhaler Inhale 2 puffs into the lungs every 4 (four) hours as needed for wheezing or shortness of breath.   Yes [provider]  amLODipine (NORVASC) 5 MG tablet Take 5 mg by mouth daily.    [provider]  aspirin 81 MG tablet Take 81 mg by mouth daily.    [provider]  BAYER MICROLET LANCETS lancets Use as instructed used to check one time a day 05/22/16   Carlus Pavlov, MD  Fluticasone-Salmeterol (ADVAIR) 250-50 MCG/DOSE AEPB Inhale 1 puff into the lungs 2 (two) times daily.     [provider]  furosemide (LASIX) 40 MG tablet Take 40 mg by mouth daily.    [provider]  gabapentin (NEURONTIN) 300 MG capsule START 1 CAPSULE AT BEDTIME FOR 1 TIME A WEEK, THEN INCREASE TO 1 TABLET TWICE DAILY Patient taking differently: Take 300 mg by mouth 2 (two) times daily. START 1 CAPSULE AT BEDTIME FOR 1 TIME A WEEK, THEN INCREASE TO 1 TABLET TWICE DAILY 05/01/18   Patel, Donika K, DO  glucose blood (BAYER CONTOUR NEXT TEST) test strip Use as instructed testing one time a day. 05/22/16   Gherghe,  Silvestre Mesi, MD  levothyroxine (SYNTHROID, LEVOTHROID) 150 MCG tablet Take 1 tablet (150 mcg total) by mouth daily before breakfast. 01/27/18   Carlus Pavlov, MD  metFORMIN (GLUCOPHAGE) 500 MG tablet TAKE 1 TABLET BY MOUTH TWICE A DAY WITH A MEAL Patient taking differently: Take 500 mg by mouth 2 (two) times daily with a meal.  04/25/18   Carlus Pavlov, MD  Multiple Vitamins-Minerals (MULTIVITAMIN WITH MINERALS) tablet Take 1 tablet by mouth daily.    [provider]  potassium chloride (KLOR-CON 10) 10 MEQ tablet Take 20  mEq by mouth daily.     [provider]  telmisartan (MICARDIS) 80 MG tablet Take 80 mg by mouth daily.    [provider]  traMADol (ULTRAM) 50 MG tablet Take 1 tablet (50 mg total) by mouth every 6 (six) hours as needed. 04/27/16   Domenick Gong, MD    Family History Family History  Problem Relation Age of Onset  . Cancer Mother        breast  . Breast cancer Mother 90  . Hypertension Father   . Breast cancer Sister 72  . Breast cancer Sister 97    Social History Social History   Tobacco Use  . Smoking status: Never Smoker  . Smokeless tobacco: Never Used  Substance Use Topics  . Alcohol use: No    Alcohol/week: 0.0 standard drinks  . Drug use: No     Allergies   Patient has no known allergies.   Review of Systems Review of Systems  Constitutional: Negative for chills and fever.  HENT: Negative for ear pain and sore throat.   Eyes: Negative for pain and visual disturbance.  Respiratory: Negative for cough and shortness of breath.   Cardiovascular: Negative for chest pain and palpitations.  Gastrointestinal: Positive for abdominal pain, anorexia, nausea and vomiting. Negative for constipation, diarrhea, flatus, hematochezia and melena.  Genitourinary: Negative for dysuria, frequency and hematuria.  Musculoskeletal: Negative for arthralgias, back pain and myalgias.  Skin: Negative for color change and rash.  Neurological: Negative for seizures, syncope and headaches.  All other systems reviewed and are negative.    Physical Exam Updated Vital Signs  ED Triage Vitals [06/30/18 1035]  Enc Vitals Group     BP (!) 184/111     Pulse Rate 83     Resp 18     Temp 99.1 F (37.3 C)     Temp Source Oral     SpO2 98 %     Weight      Height      Head Circumference      Peak Flow      Pain Score      Pain Loc      Pain Edu?      Excl. in GC?     Physical Exam  Constitutional: She is oriented to person, place, and time. She appears  well-developed and well-nourished. She appears distressed.  HENT:  Head: Normocephalic and atraumatic.  Eyes: Pupils are equal, round, and reactive to light. Conjunctivae and EOM are normal.  Neck: Normal range of motion. Neck supple.  Cardiovascular: Normal rate, regular rhythm, normal heart sounds and intact distal pulses.  No murmur heard. Pulmonary/Chest: Effort normal and breath sounds normal. No respiratory distress.  Abdominal: Soft. She exhibits no distension. There is no tenderness.  Musculoskeletal: She exhibits tenderness (TTP in right CVA). She exhibits no edema.  Neurological: She is alert and oriented to person, place, and time.  Skin: Skin  is warm and dry. Capillary refill takes less than 2 seconds.  Psychiatric: She has a normal mood and affect.  Nursing note and vitals reviewed.    ED Treatments / Results  Labs (all labs ordered are listed, but only abnormal results are displayed) Labs Reviewed  URINALYSIS, ROUTINE W REFLEX MICROSCOPIC - Abnormal; Notable for the following components:      Result Value   Color, Urine STRAW (*)    Ketones, ur 20 (*)    Protein, ur 100 (*)    Bacteria, UA RARE (*)    All other components within normal limits  BASIC METABOLIC PANEL - Abnormal; Notable for the following components:   Glucose, Bld 149 (*)    Creatinine, Ser 1.34 (*)    GFR calc non Af Amer 41 (*)    GFR calc Af Amer 47 (*)    All other components within normal limits  CBC - Abnormal; Notable for the following components:   WBC 12.3 (*)    RDW 15.9 (*)    All other components within normal limits  HEPATIC FUNCTION PANEL - Abnormal; Notable for the following components:   Total Protein 8.5 (*)    All other components within normal limits  URINE CULTURE  LIPASE, BLOOD    EKG None  Radiology Ct Renal Stone Study  Result Date: 06/30/2018 CLINICAL DATA:  Right flank pain with nausea and vomiting for 24 hours. EXAM: CT ABDOMEN AND PELVIS WITHOUT CONTRAST  TECHNIQUE: Multidetector CT imaging of the abdomen and pelvis was performed following the standard protocol without IV contrast. COMPARISON:  None. FINDINGS: Lower chest: Heart size is normal. No pericardial effusion or significant visible coronary artery calcifications. Lung bases show minimal scarring/atelectasis and are otherwise unremarkable. Hepatobiliary: The liver is diffusely low attenuation consistent with hepatic steatosis. A 10 millimeter low-attenuation lesion is identified in the LOWER portion of the RIGHT hepatic lobe, too small to characterize on this noncontrast exam. Clearing calcifications are identified within the gallbladder. No other evidence for acute cholecystitis. Pancreas: Unremarkable. No pancreatic ductal dilatation or surrounding inflammatory changes. Spleen: Normal in size.  No focal abnormality. Adrenals/Urinary Tract: The adrenal glands are normal. Intrarenal calcifications are identified bilaterally, largest on the RIGHT measuring 14 millimeters in the LOWER pole region. Largest calculus in the LEFT kidney is 7 millimeters in the LOWER pole region. There is dilatation of the RIGHT ureter secondary to a stone at the ureterovesical junction which measures 7 millimeters. The LEFT ureter is unremarkable in appearance. The bladder and visualized portion of the urethra are normal. Stomach/Bowel: Small hiatal hernia. Stomach is otherwise normal in appearance. There are colonic diverticula. No acute diverticulitis. The cecum is in the LEFT central abdomen, redundant. The appendix is not well seen. Vascular/Lymphatic: There is atherosclerotic calcification of the abdominal aorta. No aneurysm. No retroperitoneal or mesenteric adenopathy. Reproductive: The uterus is present.  No adnexal mass. Other: No free pelvic fluid. Anterior abdominal wall is unremarkable. Musculoskeletal: There are significant degenerative changes in the LOWER lumbar spine. Disc height loss and vacuum disc identified at  L2-3, L3-4, L4-5, and L5-S1. There is a 6 millimeters anterolisthesis of L4 on L5. IMPRESSION: 1. Obstructing 7 millimeter calculus at the RIGHT ureterovesical junction. 2. Additional nonobstructing stones in both kidneys. 3. Small hiatal hernia. 4.  Aortic atherosclerosis.  (ICD10-I70.0) 5. Hepatic steatosis. 6. Indeterminate small mass in the RIGHT hepatic lobe, likely benign. 7. Colonic diverticulosis without acute diverticulitis. 8. Significant lumbar spondylosis and grade 1 anterolisthesis of L4 on  L5. Electronically Signed   By: Norva Pavlov M.D.   On: 06/30/2018 13:49    Procedures Procedures (including critical care time)  Medications Ordered in ED Medications  ondansetron (ZOFRAN) injection 4 mg (has no administration in time range)  fentaNYL (SUBLIMAZE) injection 50 mcg (has no administration in time range)  lactated ringers bolus 1,000 mL (1,000 mLs Intravenous New Bag/Given 06/30/18 1354)  fentaNYL (SUBLIMAZE) injection 50 mcg (50 mcg Intravenous Given 06/30/18 1319)  ondansetron (ZOFRAN) injection 4 mg (4 mg Intravenous Given 06/30/18 1319)  fentaNYL (SUBLIMAZE) injection 50 mcg (50 mcg Intravenous Given 06/30/18 1450)  prochlorperazine (COMPAZINE) injection 10 mg (10 mg Intravenous Given 06/30/18 1450)     Initial Impression / Assessment and Plan / ED Course  I have reviewed the triage vital signs and the nursing notes.  Pertinent labs & imaging results that were available during my care of the patient were reviewed by me and considered in my medical decision making (see chart for details).     Jennifer Huffman is a 67 year old female with history of diabetes, high cholesterol, hypertension who presents to the ED with right flank pain.  Patient with hypertension upon arrival but otherwise unremarkable vitals.  Patient with severe right-sided flank pain, nausea, vomiting since last night.  Has a history of kidney stones.  Denies any obvious hematuria.  Recently had a kidney  stone intervened on on the left kidney several weeks ago.  Pain feels similar.  She denies any diarrhea, chest pain, shortness of breath.  Patient tender in the right CVA area on exam.  She is in distress.  Lab work collected including urinalysis, CT renal study to be ordered.  Patient given IV lactated Ringer bolus.  IV fentanyl, IV Zofran.  Patient to be reevaluated.  Patient with 7 mm kidney stone at the right UVJ causing obstruction.  Patient with an AKI as well.  No obvious infection on urinalysis.  Otherwise lab work unremarkable.  Patient reevaluated and still with discomfort.  Nausea, vomiting.  Will give IV Compazine and another dose of IV fentanyl.  Dr. Annabell Howells with urology was consulted and will admit the patient to [facility] long and likely intervene on kidney stone today.  Patient transferred in stable condition.  This chart was dictated using voice recognition software.  Despite best efforts to proofread,  errors can occur which can change the documentation meaning.   Final Clinical Impressions(s) / ED Diagnoses   Final diagnoses:  Kidney stone on right side  AKI (acute kidney injury) [facility])    ED Discharge Orders    None       Virgina Norfolk, DO 06/30/18 1509

## 2018-06-30 NOTE — ED Notes (Signed)
Pt to be in OR at 1830.

## 2018-06-30 NOTE — Anesthesia Procedure Notes (Signed)
Procedure Name: Intubation Date/Time: 06/30/2018 10:13 PM Performed by: Montel Clock, CRNA Pre-anesthesia Checklist: Patient identified, Emergency Drugs available, Suction available, Patient being monitored and Timeout performed Patient Re-evaluated:Patient Re-evaluated prior to induction Oxygen Delivery Method: Circle system utilized Preoxygenation: Pre-oxygenation with 100% oxygen Induction Type: IV induction and Rapid sequence Laryngoscope Size: Mac and 3 Grade View: Grade II Tube type: Oral Tube size: 7.0 mm Number of attempts: 1 Airway Equipment and Method: Stylet Placement Confirmation: ETT inserted through vocal cords under direct vision,  positive ETCO2 and breath sounds checked- equal and bilateral Secured at: 21 cm Tube secured with: Tape Dental Injury: Teeth and Oropharynx as per pre-operative assessment

## 2018-06-30 NOTE — Transfer of Care (Signed)
Immediate Anesthesia Transfer of Care Note  Patient: Jennifer Huffman  Procedure(s) Performed: CYSTOSCOPY/RETROGRADE/URETEROSCOPY/STONE EXTRACTION WITH BASKET INSERTION DOUBLE J STENT (Right Ureter) HOLMIUM LASER APPLICATION (Right )  Patient Location: PACU  Anesthesia Type:General  Level of Consciousness: drowsy and patient cooperative  Airway & Oxygen Therapy: Patient Spontanous Breathing and Patient connected to face mask oxygen  Post-op Assessment: Report given to RN and Post -op Vital signs reviewed and stable  Post vital signs: Reviewed and stable  Last Vitals:  Vitals Value Taken Time  BP    Temp    Pulse    Resp    SpO2      Last Pain:  Vitals:   06/30/18 2027  TempSrc: Oral  PainSc:       Patients Stated Pain Goal: 3 (06/30/18 1638)  Complications: No apparent anesthesia complications

## 2018-06-30 NOTE — ED Triage Notes (Signed)
Pt to ER for evaluation of right flank pain x24 hours with nausea and vomiting. Was given 4 mg zofran prior to arrival. Pt ambulatory with EMS.

## 2018-06-30 NOTE — Anesthesia Preprocedure Evaluation (Signed)
Anesthesia Evaluation    Airway Mallampati: III  TM Distance: >3 FB Neck ROM: Full    Dental  (+) Dental Advisory Given   Pulmonary asthma ,    breath sounds clear to auscultation       Cardiovascular hypertension, Pt. on medications  Rhythm:Regular Rate:Normal     Neuro/Psych negative neurological ROS     GI/Hepatic negative GI ROS, Neg liver ROS,   Endo/Other  diabetes, Type 2, Oral Hypoglycemic AgentsHypothyroidism Morbid obesity  Renal/GU Renal InsufficiencyRenal disease     Musculoskeletal  (+) Arthritis ,   Abdominal   Peds  Hematology negative hematology ROS (+)   Anesthesia Other Findings   Reproductive/Obstetrics                             Lab Results  Component Value Date   WBC 12.3 (H) 06/30/2018   HGB 12.6 06/30/2018   HCT 41.8 06/30/2018   MCV 87.6 06/30/2018   PLT 331 06/30/2018   Lab Results  Component Value Date   CREATININE 1.34 (H) 06/30/2018   BUN 18 06/30/2018   NA 137 06/30/2018   K 4.1 06/30/2018   CL 103 06/30/2018   CO2 22 06/30/2018    Anesthesia Physical Anesthesia Plan  ASA: III  Anesthesia Plan: General   Post-op Pain Management:    Induction: Intravenous  PONV Risk Score and Plan: 3 and Ondansetron, Dexamethasone and Treatment may vary due to age or medical condition  Airway Management Planned: Oral ETT  Additional Equipment:   Intra-op Plan:   Post-operative Plan: Extubation in OR  Informed Consent: I have reviewed the patients History and Physical, chart, labs and discussed the procedure including the risks, benefits and alternatives for the proposed anesthesia with the patient or authorized representative who has indicated his/her understanding and acceptance.   Dental advisory given  Plan Discussed with: CRNA  Anesthesia Plan Comments:         Anesthesia Quick Evaluation

## 2018-06-30 NOTE — H&P (Signed)
Subjective: CC: Right flank pain.  Hx: Jennifer Huffman is a 67 yo AAF who I was asked to see in consultation by the [facility] EDP, Dr. Lockie Mola, for a 7 mm right UVJ stone with pain.   She had the onset yesterday of severe right flank pain with nausea and vomiting.  She has no hematuria.  She has urgency and frequency.  She has had a slight fever.  The CT also showed a 14mm RLP stone.  Her UA was clear but she has a mild leukocytosis and a low grade fever on 99.1.  She had a left ureteroscopy by Dr. Wilson Singer in [facility] in early August.   She has gotten some pain relief but remains symptomatic.  ROS:  Review of Systems  Respiratory: Negative for shortness of breath.   Cardiovascular: Negative for chest pain.  Gastrointestinal: Positive for nausea and vomiting.  Genitourinary: Positive for flank pain.  All other systems reviewed and are negative.   No Known Allergies  Past Medical History:  Diagnosis Date  . Asthma, mild intermittent   . Blurred vision, bilateral   . Diabetes mellitus without complication (HCC)    type 2  . Dyspnea    with exertion  . Hyperlipidemia   . Hypertension   . Hypothyroidism   . Lymphedema of both lower extremities    compressions hose when out anf wraps at home  . Neuromuscular disorder (HCC)   . Osteoarthritis of both knees     Past Surgical History:  Procedure Laterality Date  . ABDOMINAL HYSTERECTOMY     partial  . COLONOSCOPY WITH PROPOFOL N/A 01/17/2017   Procedure: COLONOSCOPY WITH PROPOFOL;  Surgeon: Charlott Rakes, MD;  Location: [facility];  Service: Endoscopy;  Laterality: N/A;    Social History   Socioeconomic History  . Marital status: Legally Separated    Spouse name: Not on file  . Number of children: 3  . Years of education: 108  . Highest education level: Not on file  Occupational History  . Occupation: retired  Engineer, production  . Financial resource strain: Not on file  . Food insecurity:    Worry: Not on file    Inability: Not on  file  . Transportation needs:    Medical: Not on file    Non-medical: Not on file  Tobacco Use  . Smoking status: Never Smoker  . Smokeless tobacco: Never Used  Substance and Sexual Activity  . Alcohol use: No    Alcohol/week: 0.0 standard drinks  . Drug use: No  . Sexual activity: Not on file  Lifestyle  . Physical activity:    Days per week: Not on file    Minutes per session: Not on file  . Stress: Not on file  Relationships  . Social connections:    Talks on phone: Not on file    Gets together: Not on file    Attends religious service: Not on file    Active member of club or organization: Not on file    Attends meetings of clubs or organizations: Not on file    Relationship status: Not on file  . Intimate partner violence:    Fear of current or ex partner: Not on file    Emotionally abused: Not on file    Physically abused: Not on file    Forced sexual activity: Not on file  Other Topics Concern  . Not on file  Social History Narrative   Lives with her sister and a friend in a  one story home.  Has 2 children.  Retired school bus attendant.  Education: 12.    Family History  Problem Relation Age of Onset  . Cancer Mother        breast  . Breast cancer Mother 18  . Hypertension Father   . Breast cancer Sister 3  . Breast cancer Sister 87    Anti-infectives: Anti-infectives (From admission, onward)   Start     Dose/Rate Route Frequency Ordered Stop   06/30/18 1710  ceFAZolin (ANCEF) IVPB 2g/100 mL premix     2 g 200 mL/hr over 30 Minutes Intravenous 30 min pre-op 06/30/18 1710        Current Facility-Administered Medications  Medication Dose Route Frequency Provider Last Rate Last Dose  . ceFAZolin (ANCEF) IVPB 2g/100 mL premix  2 g Intravenous 30 min Pre-Op Bjorn Pippin, MD      . fentaNYL (SUBLIMAZE) injection 50 mcg  50 mcg Intravenous Q2H PRN Curatolo, Adam, DO      . ondansetron (ZOFRAN) injection 4 mg  4 mg Intravenous Q6H PRN Curatolo, Adam, DO          Objective: Vital signs in last 24 hours: Temp:  [99.1 F (37.3 C)] 99.1 F (37.3 C) (09/30 1035) Pulse Rate:  [83-93] 92 (09/30 1530) Resp:  [18] 18 (09/30 1035) BP: (169-191)/(98-116) 183/110 (09/30 1515) SpO2:  [94 %-98 %] 98 % (09/30 1530)  Intake/Output from previous day: No intake/output data recorded. Intake/Output this shift: No intake/output data recorded.   Physical Exam  Constitutional: She is oriented to person, place, and time.  Obese, WD AAF in NAD but medicated  HENT:  Head: Normocephalic and atraumatic.  Neck: Normal range of motion. Neck supple.  Cardiovascular: Normal rate, regular rhythm and normal heart sounds.  Pulmonary/Chest: Breath sounds normal. She is in respiratory distress.  Abdominal: Soft. There is tenderness (right CVAT).  Musculoskeletal: Normal range of motion. She exhibits edema (lymphedema in both legs. ). She exhibits no tenderness.  Neurological: She is oriented to person, place, and time.  Skin: Skin is warm and dry.  Psychiatric:  sedated  Vitals reviewed.   Lab Results:  Recent Labs    06/30/18 1035  WBC 12.3*  HGB 12.6  HCT 41.8  PLT 331   BMET Recent Labs    06/30/18 1035  NA 137  K 4.1  CL 103  CO2 22  GLUCOSE 149*  BUN 18  CREATININE 1.34*  CALCIUM 9.5   PT/INR No results for input(s): LABPROT, INR in the last 72 hours. ABG No results for input(s): PHART, HCO3 in the last 72 hours.  Invalid input(s): PCO2, PO2  Studies/Results: Ct Renal Stone Study  Result Date: 06/30/2018 CLINICAL DATA:  Right flank pain with nausea and vomiting for 24 hours. EXAM: CT ABDOMEN AND PELVIS WITHOUT CONTRAST TECHNIQUE: Multidetector CT imaging of the abdomen and pelvis was performed following the standard protocol without IV contrast. COMPARISON:  None. FINDINGS: Lower chest: Heart size is normal. No pericardial effusion or significant visible coronary artery calcifications. Lung bases show minimal scarring/atelectasis and  are otherwise unremarkable. Hepatobiliary: The liver is diffusely low attenuation consistent with hepatic steatosis. A 10 millimeter low-attenuation lesion is identified in the LOWER portion of the RIGHT hepatic lobe, too small to characterize on this noncontrast exam. Clearing calcifications are identified within the gallbladder. No other evidence for acute cholecystitis. Pancreas: Unremarkable. No pancreatic ductal dilatation or surrounding inflammatory changes. Spleen: Normal in size.  No focal abnormality. Adrenals/Urinary Tract: The  adrenal glands are normal. Intrarenal calcifications are identified bilaterally, largest on the RIGHT measuring 14 millimeters in the LOWER pole region. Largest calculus in the LEFT kidney is 7 millimeters in the LOWER pole region. There is dilatation of the RIGHT ureter secondary to a stone at the ureterovesical junction which measures 7 millimeters. The LEFT ureter is unremarkable in appearance. The bladder and visualized portion of the urethra are normal. Stomach/Bowel: Small hiatal hernia. Stomach is otherwise normal in appearance. There are colonic diverticula. No acute diverticulitis. The cecum is in the LEFT central abdomen, redundant. The appendix is not well seen. Vascular/Lymphatic: There is atherosclerotic calcification of the abdominal aorta. No aneurysm. No retroperitoneal or mesenteric adenopathy. Reproductive: The uterus is present.  No adnexal mass. Other: No free pelvic fluid. Anterior abdominal wall is unremarkable. Musculoskeletal: There are significant degenerative changes in the LOWER lumbar spine. Disc height loss and vacuum disc identified at L2-3, L3-4, L4-5, and L5-S1. There is a 6 millimeters anterolisthesis of L4 on L5. IMPRESSION: 1. Obstructing 7 millimeter calculus at the RIGHT ureterovesical junction. 2. Additional nonobstructing stones in both kidneys. 3. Small hiatal hernia. 4.  Aortic atherosclerosis.  (ICD10-I70.0) 5. Hepatic steatosis. 6.  Indeterminate small mass in the RIGHT hepatic lobe, likely benign. 7. Colonic diverticulosis without acute diverticulitis. 8. Significant lumbar spondylosis and grade 1 anterolisthesis of L4 on L5. Electronically Signed   By: Norva Pavlov M.D.   On: 06/30/2018 13:49   I have discussed her case with the EDP and reviewed her CT films and report.  I have reviewed her labs.   Assessment: Right UVJ stone with persistent pain and mild AKI.  I am going to take her to the OR for cystoscopy with ureteroscopic stone extraction and stent insertion.  I have reviewed the risks of bleeding, infection, need for a stent and secondary procedures, thrombotic events and anesthetic complications.    Right lower pole renal stones.  I will leave the stent and arrange outpatient ESWL in the next couple of weeks.    CC:  Dr. Virgina Norfolk.       Bjorn Pippin 06/30/2018 [phone]

## 2018-06-30 NOTE — Op Note (Signed)
Procedure: 1.  Cystoscopy with right retrograde pyelogram and interpretation. 2.  Right ureteroscopic stone extraction. 3.  Right ureteral stent insertion.  Preop diagnosis: Right distal ureteral stone.  Postop diagnosis: Same.  Surgeon: Dr. Bjorn Pippin.  Anesthesia: General.  Drain: 6 French by 24 cm contour double-J stent.  Specimen: Stone.  EBL: Minimal.  Complications: None.  Indications: Jennifer Huffman is a 67 year old African-American female with a history of urolithiasis who had a left ureteroscopic stone extraction at [facility] 1 month ago.  She presented to the emergency room at [facility] with right flank pain and was found to have a 6 mm right distal ureteral stone with obstruction and a 14 mm right lower pole stone along with a 10 mm left lower pole stone.  After reviewing the options it was felt that ureteroscopic stone extraction of the right ureteral stent was indicated with stent placement for subsequent treatment of the right lower pole stone.  Procedure: She was given Ancef.  A general anesthetic was induced and she was placed in lithotomy position and fitted with PAS hose.  Her perineum and genitalia were prepped with Betadine solution she was draped in usual sterile fashion.  Cystoscopy was performed using a 23 [place] scope and 30 degree lens.  Examination: Normal urethra.  The bladder wall was smooth and pale without tumor stones or inflammation.  Ureteral orifice ease were unremarkable.  The right ureteral orifice was cannulated with 5 French opening catheter and contrast was instilled.    Right retrograde pyelogram demonstrated a filling defect in the distal ureter consistent with a 6 mm stone with proximal hydronephrosis.  The guidewire was then passed by the stone to the kidney and the opening catheter was removed.  She was noted to have somewhat turbid bloody E flux alongside the wire from the ureteral orifice.  I then reinserted the opening catheter over the  wire to the kidney remove the wire and obtained a urine specimen from the right renal pelvis to send for culture.  Inspection of the urine from the renal pelvis revealed what appeared to be primarily hematuria not pyuria it was felt it was safe to proceed with ureteroscopy.  A 12 French 35 cm digital access sheath inner core was passed over the wire and the distal ureter was easily dilated by the stone.  A 6.5 French dual-lumen for later ureteroscope was then passed alongside the wire and the stone was visualized.  I was able to grasp it with an engage basket and removed intact without difficulty.  A 6 French 24 cm contour double-J stent was then inserted over the wire to the kidney under fluoroscopic guidance and the wire was removed leaving good coil in the kidney and a good coil in the bladder.  A 16 French Foley catheter was then inserted and the balloon was filled with 10 cc of sterile fluid.  Patient was taken down from lithotomy position, her anesthetic was reversed and she was moved to recovery room in stable condition.  There are no complications

## 2018-06-30 NOTE — ED Notes (Signed)
ED Provider at bedside. 

## 2018-06-30 NOTE — ED Notes (Signed)
Patient transported to CT 

## 2018-07-01 ENCOUNTER — Encounter (HOSPITAL_COMMUNITY): Payer: Self-pay | Admitting: Urology

## 2018-07-01 ENCOUNTER — Other Ambulatory Visit: Payer: Self-pay | Admitting: Urology

## 2018-07-01 DIAGNOSIS — N202 Calculus of kidney with calculus of ureter: Secondary | ICD-10-CM | POA: Diagnosis not present

## 2018-07-01 DIAGNOSIS — N179 Acute kidney failure, unspecified: Secondary | ICD-10-CM | POA: Diagnosis not present

## 2018-07-01 DIAGNOSIS — N201 Calculus of ureter: Secondary | ICD-10-CM | POA: Diagnosis not present

## 2018-07-01 DIAGNOSIS — R1031 Right lower quadrant pain: Secondary | ICD-10-CM | POA: Diagnosis not present

## 2018-07-01 LAB — BASIC METABOLIC PANEL
Anion gap: 10 (ref 5–15)
BUN: 15 mg/dL (ref 8–23)
CALCIUM: 9.2 mg/dL (ref 8.9–10.3)
CO2: 26 mmol/L (ref 22–32)
Chloride: 105 mmol/L (ref 98–111)
Creatinine, Ser: 1.11 mg/dL — ABNORMAL HIGH (ref 0.44–1.00)
GFR calc Af Amer: 59 mL/min — ABNORMAL LOW (ref >60)
GFR, EST NON AFRICAN AMERICAN: 51 mL/min — AB (ref >60)
Glucose, Bld: 149 mg/dL — ABNORMAL HIGH (ref 70–99)
Potassium: 4.3 mmol/L (ref 3.5–5.1)
SODIUM: 141 mmol/L (ref 135–145)

## 2018-07-01 LAB — CBC
HCT: 36.5 % (ref 36.0–46.0)
Hemoglobin: 11.3 g/dL — ABNORMAL LOW (ref 12.0–15.0)
MCH: 26.5 pg (ref 26.0–34.0)
MCHC: 31 g/dL (ref 30.0–36.0)
MCV: 85.5 fL (ref 78.0–100.0)
PLATELETS: 309 10*3/uL (ref 150–400)
RBC: 4.27 MIL/uL (ref 3.87–5.11)
RDW: 16.5 % — AB (ref 11.5–15.5)
WBC: 10.5 10*3/uL (ref 4.0–10.5)

## 2018-07-01 LAB — URINE CULTURE

## 2018-07-01 LAB — HIV ANTIBODY (ROUTINE TESTING W REFLEX): HIV Screen 4th Generation wRfx: NONREACTIVE

## 2018-07-01 LAB — GLUCOSE, CAPILLARY: GLUCOSE-CAPILLARY: 124 mg/dL — AB (ref 70–99)

## 2018-07-01 MED ORDER — INSULIN ASPART 100 UNIT/ML ~~LOC~~ SOLN
0.0000 [IU] | Freq: Three times a day (TID) | SUBCUTANEOUS | Status: DC
  Administered 2018-07-01: 2 [IU] via SUBCUTANEOUS

## 2018-07-01 MED ORDER — HYDROMORPHONE HCL 1 MG/ML IJ SOLN: 0.5000 mg | INTRAMUSCULAR | Status: DC | PRN

## 2018-07-01 MED ORDER — MOMETASONE FURO-FORMOTEROL FUM 200-5 MCG/ACT IN AERO
2.0000 | INHALATION_SPRAY | Freq: Two times a day (BID) | RESPIRATORY_TRACT | Status: DC
  Administered 2018-07-01: 2 via RESPIRATORY_TRACT
  Filled 2018-07-01 (×2): qty 8.8

## 2018-07-01 MED ORDER — FENTANYL CITRATE (PF) 100 MCG/2ML IJ SOLN: 25.0000 ug | INTRAMUSCULAR | Status: DC | PRN

## 2018-07-01 MED ORDER — HYDROCODONE-ACETAMINOPHEN 5-325 MG PO TABS: 1.0000 | ORAL_TABLET | Freq: Four times a day (QID) | ORAL | 0 refills | Status: DC | PRN

## 2018-07-01 MED ORDER — ACETAMINOPHEN 325 MG PO TABS: 650.0000 mg | ORAL_TABLET | ORAL | Status: DC | PRN

## 2018-07-01 MED ORDER — CEFAZOLIN SODIUM-DEXTROSE 1-4 GM/50ML-% IV SOLN
1.0000 g | Freq: Three times a day (TID) | INTRAVENOUS | Status: DC
  Administered 2018-07-01: 1 g via INTRAVENOUS
  Filled 2018-07-01 (×2): qty 50

## 2018-07-01 MED ORDER — METFORMIN HCL 500 MG PO TABS
500.0000 mg | ORAL_TABLET | Freq: Two times a day (BID) | ORAL | Status: DC
  Administered 2018-07-01: 500 mg via ORAL
  Filled 2018-07-01: qty 1

## 2018-07-01 MED ORDER — TAMSULOSIN HCL 0.4 MG PO CAPS
0.4000 mg | ORAL_CAPSULE | Freq: Every day | ORAL | Status: DC
  Administered 2018-07-01: 0.4 mg via ORAL
  Filled 2018-07-01: qty 1

## 2018-07-01 MED ORDER — GABAPENTIN 300 MG PO CAPS
300.0000 mg | ORAL_CAPSULE | Freq: Two times a day (BID) | ORAL | Status: DC
  Administered 2018-07-01 (×2): 300 mg via ORAL
  Filled 2018-07-01 (×2): qty 1

## 2018-07-01 MED ORDER — ONDANSETRON HCL 4 MG/2ML IJ SOLN: 4.0000 mg | INTRAMUSCULAR | Status: DC | PRN

## 2018-07-01 MED ORDER — CEPHALEXIN 500 MG PO CAPS: 500.0000 mg | ORAL_CAPSULE | Freq: Three times a day (TID) | ORAL | 0 refills | Status: AC

## 2018-07-01 MED ORDER — POTASSIUM CHLORIDE IN NACL 20-0.45 MEQ/L-% IV SOLN
INTRAVENOUS | Status: DC
  Administered 2018-07-01: 01:00:00 via INTRAVENOUS
  Filled 2018-07-01 (×2): qty 1000

## 2018-07-01 MED ORDER — AMLODIPINE BESYLATE 5 MG PO TABS
5.0000 mg | ORAL_TABLET | Freq: Every day | ORAL | Status: DC
  Administered 2018-07-01: 5 mg via ORAL
  Filled 2018-07-01: qty 1

## 2018-07-01 MED ORDER — FLEET ENEMA 7-19 GM/118ML RE ENEM: 1.0000 | ENEMA | Freq: Once | RECTAL | Status: DC | PRN

## 2018-07-01 MED ORDER — PROMETHAZINE HCL 25 MG/ML IJ SOLN: 6.2500 mg | INTRAMUSCULAR | Status: DC | PRN

## 2018-07-01 MED ORDER — SENNOSIDES-DOCUSATE SODIUM 8.6-50 MG PO TABS: 1.0000 | ORAL_TABLET | Freq: Every evening | ORAL | Status: DC | PRN

## 2018-07-01 MED ORDER — FUROSEMIDE 40 MG PO TABS
40.0000 mg | ORAL_TABLET | Freq: Every day | ORAL | Status: DC
  Administered 2018-07-01: 40 mg via ORAL
  Filled 2018-07-01: qty 1

## 2018-07-01 MED ORDER — ALBUTEROL SULFATE (2.5 MG/3ML) 0.083% IN NEBU: 3.0000 mL | INHALATION_SOLUTION | RESPIRATORY_TRACT | Status: DC | PRN

## 2018-07-01 MED ORDER — OXYCODONE HCL 5 MG PO TABS: 5.0000 mg | ORAL_TABLET | ORAL | Status: DC | PRN

## 2018-07-01 MED ORDER — BISACODYL 10 MG RE SUPP: 10.0000 mg | Freq: Every day | RECTAL | Status: DC | PRN

## 2018-07-01 MED ORDER — LABETALOL HCL 5 MG/ML IV SOLN: 10.0000 mg | Freq: Once | INTRAVENOUS | Status: DC

## 2018-07-01 MED ORDER — LEVOTHYROXINE SODIUM 75 MCG PO TABS
150.0000 ug | ORAL_TABLET | Freq: Every day | ORAL | Status: DC
  Administered 2018-07-01: 150 ug via ORAL
  Filled 2018-07-01: qty 2

## 2018-07-01 MED ORDER — LABETALOL HCL 5 MG/ML IV SOLN
INTRAVENOUS | Status: AC
  Filled 2018-07-01: qty 4

## 2018-07-01 MED ORDER — POTASSIUM CHLORIDE ER 10 MEQ PO TBCR: 20.0000 meq | EXTENDED_RELEASE_TABLET | Freq: Every day | ORAL | Status: DC

## 2018-07-01 MED ORDER — ZOLPIDEM TARTRATE 5 MG PO TABS: 5.0000 mg | ORAL_TABLET | Freq: Every evening | ORAL | Status: DC | PRN

## 2018-07-01 MED ORDER — IRBESARTAN 300 MG PO TABS
300.0000 mg | ORAL_TABLET | Freq: Every day | ORAL | Status: DC
  Administered 2018-07-01: 300 mg via ORAL
  Filled 2018-07-01: qty 1

## 2018-07-01 MED ORDER — ASPIRIN EC 81 MG PO TBEC
81.0000 mg | DELAYED_RELEASE_TABLET | Freq: Every day | ORAL | Status: DC
  Administered 2018-07-01: 81 mg via ORAL
  Filled 2018-07-01: qty 1

## 2018-07-01 MED ORDER — HYOSCYAMINE SULFATE 0.125 MG SL SUBL
0.1250 mg | SUBLINGUAL_TABLET | SUBLINGUAL | Status: DC | PRN
  Filled 2018-07-01: qty 1

## 2018-07-01 NOTE — Discharge Summary (Signed)
Physician Discharge Summary  Patient ID: Jennifer Huffman MRN: [id number] DOB/AGE: 66/11/1950 66 y.o.  Admit date: 06/30/2018 Discharge date: 07/01/2018  Admission Diagnoses:  Right ureteral stone  Discharge Diagnoses:  Principal Problem:   Right ureteral stone Active Problems:   Kidney stone   AKI (acute kidney injury) [facility])   Past Medical History:  Diagnosis Date  . Asthma, mild intermittent   . Blurred vision, bilateral   . Diabetes mellitus without complication (HCC)    type 2  . Dyspnea    with exertion  . Hyperlipidemia   . Hypertension   . Hypothyroidism   . Lymphedema of both lower extremities    compressions hose when out anf wraps at home  . Neuromuscular disorder (HCC)   . Osteoarthritis of both knees     Surgeries: Procedure(s): CYSTOSCOPY/RETROGRADE/URETEROSCOPY/STONE EXTRACTION WITH BASKET INSERTION DOUBLE J STENT HOLMIUM LASER APPLICATION on 06/30/2018   Consultants (if any): Treatment Team:  Bjorn Pippin, MD  Discharged Condition: Improved  Hospital Course: [name] is an 67 y.o. female who was admitted 06/30/2018 with a diagnosis of Right ureteral stone and went to the operating room on 06/30/2018 and underwent the above named procedures.  She had her foley removed this morning and is doing well without pain.  She had a low grade temp to 99.9 this morning but that has declined to 99.1 and her VS are other acceptable. Her Cr has returned to near baseline at 1.11 and her WBC count has returned to normal.   She will be discharged home and will be scheduled for ESWL for the RLP stone in the next couple of weeks.  I reviewed the risks of the procedure in detail and included information about the procedure in her discharge instructions.   She will have the stent removed after the ESWL.   She was given perioperative antibiotics:  Anti-infectives (From admission, onward)   Start     Dose/Rate Route Frequency Ordered Stop   07/01/18 0500  ceFAZolin  (ANCEF) IVPB 1 g/50 mL premix     1 g 100 mL/hr over 30 Minutes Intravenous Every 8 hours 07/01/18 0038     07/01/18 0000  cephALEXin (KEFLEX) 500 MG capsule     500 mg Oral 3 times daily 07/01/18 1052 07/11/18 2359   06/30/18 2201  ceFAZolin (ANCEF) 2-4 GM/100ML-% IVPB    Note to Pharmacy:  Lyda Kalata   : cabinet override      06/30/18 2201 06/30/18 2214   06/30/18 1710  ceFAZolin (ANCEF) IVPB 2g/100 mL premix     2 g 200 mL/hr over 30 Minutes Intravenous 30 min pre-op 06/30/18 1710 06/30/18 2219    .  She was given sequential compression devices for DVT prophylaxis.  She benefited maximally from the hospital stay and there were no complications.    Recent vital signs:  Vitals:   07/01/18 0857 07/01/18 0959  BP:  (!) 158/73  Pulse:  67  Resp:  20  Temp:  99.1 F (37.3 C)  SpO2: 93% 92%    Recent laboratory studies:  Lab Results  Component Value Date   HGB 11.3 (L) 07/01/2018   HGB 12.6 06/30/2018   HGB 11.9 (L) 03/11/2018   Lab Results  Component Value Date   WBC 10.5 07/01/2018   PLT 309 07/01/2018   No results found for: INR Lab Results  Component Value Date   NA 141 07/01/2018   K 4.3 07/01/2018   CL 105 07/01/2018   CO2 26 07/01/2018  BUN 15 07/01/2018   CREATININE 1.11 (H) 07/01/2018   GLUCOSE 149 (H) 07/01/2018    Discharge Medications:   Allergies as of 07/01/2018   No Known Allergies     Medication List    TAKE these medications   albuterol 108 (90 Base) MCG/ACT inhaler Commonly known as:  PROVENTIL HFA;VENTOLIN HFA Inhale 2 puffs into the lungs every 4 (four) hours as needed for wheezing or shortness of breath.   amLODipine 5 MG tablet Commonly known as:  NORVASC Take 5 mg by mouth daily.   aspirin 81 MG tablet Take 81 mg by mouth daily.   BAYER MICROLET LANCETS lancets Use as instructed used to check one time a day   cephALEXin 500 MG capsule Commonly known as:  KEFLEX Take 1 capsule (500 mg total) by mouth 3 (three) times  daily for 10 days.   Fluticasone-Salmeterol 250-50 MCG/DOSE Aepb Commonly known as:  ADVAIR Inhale 1 puff into the lungs 2 (two) times daily.   furosemide 40 MG tablet Commonly known as:  LASIX Take 40 mg by mouth daily.   gabapentin 300 MG capsule Commonly known as:  NEURONTIN START 1 CAPSULE AT BEDTIME FOR 1 TIME A WEEK, THEN INCREASE TO 1 TABLET TWICE DAILY What changed:  See the new instructions.   glucose blood test strip Use as instructed testing one time a day.   HYDROcodone-acetaminophen 5-325 MG tablet Commonly known as:  NORCO/VICODIN Take 1 tablet by mouth every 6 (six) hours as needed for moderate pain.   KLOR-CON 10 10 MEQ tablet Generic drug:  potassium chloride Take 20 mEq by mouth daily.   levothyroxine 150 MCG tablet Commonly known as:  SYNTHROID, LEVOTHROID Take 1 tablet (150 mcg total) by mouth daily before breakfast.   metFORMIN 500 MG tablet Commonly known as:  GLUCOPHAGE TAKE 1 TABLET BY MOUTH TWICE A DAY WITH A MEAL What changed:  See the new instructions.   multivitamin with minerals tablet Take 1 tablet by mouth daily.   tamsulosin 0.4 MG Caps capsule Commonly known as:  FLOMAX Take 0.4 mg by mouth daily.   telmisartan 80 MG tablet Commonly known as:  MICARDIS Take 80 mg by mouth daily.   traMADol 50 MG tablet Commonly known as:  ULTRAM Take 1 tablet (50 mg total) by mouth every 6 (six) hours as needed.       Diagnostic Studies: Dg C-arm 1-60 Min-no Report  Result Date: 06/30/2018 Fluoroscopy was utilized by the requesting physician.  No radiographic interpretation.   Ct Renal Stone Study  Result Date: 06/30/2018 CLINICAL DATA:  Right flank pain with nausea and vomiting for 24 hours. EXAM: CT ABDOMEN AND PELVIS WITHOUT CONTRAST TECHNIQUE: Multidetector CT imaging of the abdomen and pelvis was performed following the standard protocol without IV contrast. COMPARISON:  None. FINDINGS: Lower chest: Heart size is normal. No pericardial  effusion or significant visible coronary artery calcifications. Lung bases show minimal scarring/atelectasis and are otherwise unremarkable. Hepatobiliary: The liver is diffusely low attenuation consistent with hepatic steatosis. A 10 millimeter low-attenuation lesion is identified in the LOWER portion of the RIGHT hepatic lobe, too small to characterize on this noncontrast exam. Clearing calcifications are identified within the gallbladder. No other evidence for acute cholecystitis. Pancreas: Unremarkable. No pancreatic ductal dilatation or surrounding inflammatory changes. Spleen: Normal in size.  No focal abnormality. Adrenals/Urinary Tract: The adrenal glands are normal. Intrarenal calcifications are identified bilaterally, largest on the RIGHT measuring 14 millimeters in the LOWER pole region. Largest calculus in  the LEFT kidney is 7 millimeters in the LOWER pole region. There is dilatation of the RIGHT ureter secondary to a stone at the ureterovesical junction which measures 7 millimeters. The LEFT ureter is unremarkable in appearance. The bladder and visualized portion of the urethra are normal. Stomach/Bowel: Small hiatal hernia. Stomach is otherwise normal in appearance. There are colonic diverticula. No acute diverticulitis. The cecum is in the LEFT central abdomen, redundant. The appendix is not well seen. Vascular/Lymphatic: There is atherosclerotic calcification of the abdominal aorta. No aneurysm. No retroperitoneal or mesenteric adenopathy. Reproductive: The uterus is present.  No adnexal mass. Other: No free pelvic fluid. Anterior abdominal wall is unremarkable. Musculoskeletal: There are significant degenerative changes in the LOWER lumbar spine. Disc height loss and vacuum disc identified at L2-3, L3-4, L4-5, and L5-S1. There is a 6 millimeters anterolisthesis of L4 on L5. IMPRESSION: 1. Obstructing 7 millimeter calculus at the RIGHT ureterovesical junction. 2. Additional nonobstructing stones in  both kidneys. 3. Small hiatal hernia. 4.  Aortic atherosclerosis.  (ICD10-I70.0) 5. Hepatic steatosis. 6. Indeterminate small mass in the RIGHT hepatic lobe, likely benign. 7. Colonic diverticulosis without acute diverticulitis. 8. Significant lumbar spondylosis and grade 1 anterolisthesis of L4 on L5. Electronically Signed   By: Norva Pavlov M.D.   On: 06/30/2018 13:49    Disposition: Discharge disposition: 01-Home or Self Care       Discharge Instructions    Discontinue IV   Complete by:  As directed       Follow-up Information    Bjorn Pippin, MD Follow up.   Specialty:  Urology Why:  My office will contact you to schedule the next procedure which is called lithotripsy. Contact information: [address] [place] [address] [phone]            Signed: Bjorn Pippin 07/01/2018, 10:54 AM

## 2018-07-01 NOTE — Progress Notes (Signed)
Foley catheter removed per order. Will monitor for voiding. Melton Alar, RN

## 2018-07-01 NOTE — Anesthesia Postprocedure Evaluation (Signed)
Anesthesia Post Note  Patient: Jennifer Huffman  Procedure(s) Performed: CYSTOSCOPY/RETROGRADE/URETEROSCOPY/STONE EXTRACTION WITH BASKET INSERTION DOUBLE J STENT (Right Ureter) HOLMIUM LASER APPLICATION (Right )     Patient location during evaluation: PACU Anesthesia Type: General Level of consciousness: awake and alert Pain management: pain level controlled Vital Signs Assessment: post-procedure vital signs reviewed and stable Respiratory status: spontaneous breathing, nonlabored ventilation, respiratory function stable and patient connected to nasal cannula oxygen Cardiovascular status: blood pressure returned to baseline and stable Postop Assessment: no apparent nausea or vomiting Anesthetic complications: no    Last Vitals:  Vitals:   06/30/18 2345 06/30/18 2355  BP: (!) 161/89 (!) 191/92  Pulse: 82 86  Resp: (!) 22 17  Temp:  36.9 C  SpO2: 93% 94%    Last Pain:  Vitals:   06/30/18 2355  TempSrc:   PainSc: 0-No pain                 Kennieth Rad

## 2018-07-01 NOTE — Progress Notes (Signed)
1 Day Post-Op  Subjective: She is doing well s/p right ureteroscopic stone extraction and stenting.  She has no pain or hematuria.  Her temp is 99.9.   ROS:  Review of Systems  All other systems reviewed and are negative.   Anti-infectives: Anti-infectives (From admission, onward)   Start     Dose/Rate Route Frequency Ordered Stop   07/01/18 0500  ceFAZolin (ANCEF) IVPB 1 g/50 mL premix     1 g 100 mL/hr over 30 Minutes Intravenous Every 8 hours 07/01/18 0038     06/30/18 2201  ceFAZolin (ANCEF) 2-4 GM/100ML-% IVPB    Note to Pharmacy:  Lyda Kalata   : cabinet override      06/30/18 2201 06/30/18 2214   06/30/18 1710  ceFAZolin (ANCEF) IVPB 2g/100 mL premix     2 g 200 mL/hr over 30 Minutes Intravenous 30 min pre-op 06/30/18 1710 06/30/18 2219      Current Facility-Administered Medications  Medication Dose Route Frequency Provider Last Rate Last Dose  . 0.45 % NaCl with KCl 20 mEq / L infusion   Intravenous Continuous Bjorn Pippin, MD 100 mL/hr at 07/01/18 0700    . acetaminophen (TYLENOL) tablet 650 mg  650 mg Oral Q4H PRN Bjorn Pippin, MD      . albuterol (PROVENTIL) (2.5 MG/3ML) 0.083% nebulizer solution 3 mL  3 mL Inhalation Q4H PRN Bjorn Pippin, MD      . amLODipine (NORVASC) tablet 5 mg  5 mg Oral Daily Bjorn Pippin, MD      . aspirin EC tablet 81 mg  81 mg Oral Daily Bjorn Pippin, MD      . bisacodyl (DULCOLAX) suppository 10 mg  10 mg Rectal Daily PRN Bjorn Pippin, MD      . ceFAZolin (ANCEF) IVPB 1 g/50 mL premix  1 g Intravenous Q8H Bjorn Pippin, MD   Stopped at 07/01/18 0550  . furosemide (LASIX) tablet 40 mg  40 mg Oral Daily Bjorn Pippin, MD      . gabapentin (NEURONTIN) capsule 300 mg  300 mg Oral BID Bjorn Pippin, MD   300 mg at 07/01/18 0119  . HYDROmorphone (DILAUDID) injection 0.5-1 mg  0.5-1 mg Intravenous Q2H PRN Bjorn Pippin, MD      . hyoscyamine (LEVSIN SL) SL tablet 0.125 mg  0.125 mg Sublingual Q4H PRN Bjorn Pippin, MD      . insulin aspart (novoLOG) injection  0-15 Units  0-15 Units Subcutaneous TID WC Bjorn Pippin, MD      . irbesartan (AVAPRO) tablet 300 mg  300 mg Oral Daily Bjorn Pippin, MD      . labetalol (NORMODYNE,TRANDATE) 5 MG/ML injection           . levothyroxine (SYNTHROID, LEVOTHROID) tablet 150 mcg  150 mcg Oral QAC breakfast Bjorn Pippin, MD      . metFORMIN (GLUCOPHAGE) tablet 500 mg  500 mg Oral BID WC Bjorn Pippin, MD      . mometasone-formoterol [facility]) 200-5 MCG/ACT inhaler 2 puff  2 puff Inhalation BID Bjorn Pippin, MD      . ondansetron [facility]) injection 4 mg  4 mg Intravenous Q4H PRN Bjorn Pippin, MD      . oxyCODONE (Oxy IR/ROXICODONE) immediate release tablet 5 mg  5 mg Oral Q4H PRN Bjorn Pippin, MD      . senna-docusate (Senokot-S) tablet 1 tablet  1 tablet Oral QHS PRN Bjorn Pippin, MD      . sodium phosphate (FLEET) 7-19 GM/118ML enema 1 enema  1 enema Rectal Once PRN Bjorn Pippin, MD      . tamsulosin [facility]) capsule 0.4 mg  0.4 mg Oral Daily Bjorn Pippin, MD      . zolpidem (AMBIEN) tablet 5 mg  5 mg Oral QHS PRN Bjorn Pippin, MD         Objective: Vital signs in last 24 hours: Temp:  [98.2 F (36.8 C)-99.9 F (37.7 C)] 99.9 F (37.7 C) (10/01 0418) Pulse Rate:  [71-94] 85 (10/01 0418) Resp:  [16-22] 18 (10/01 0418) BP: (143-191)/(68-116) 163/72 (10/01 0418) SpO2:  [93 %-99 %] 93 % (10/01 0418) Weight:  [829 kg] 137 kg (09/30 1834)  Intake/Output from previous day: 09/30 0701 - 10/01 0700 In: 1950.5 [P.O.:240; I.V.:1610.6; IV Piggyback:99.9] Out: 1850 [Urine:1850] Intake/Output this shift: No intake/output data recorded.   Physical Exam  Constitutional:  Obese, WD in NAD. A/O x 3.   Cardiovascular: Normal rate, regular rhythm and normal heart sounds.  Pulmonary/Chest: Effort normal and breath sounds normal. No respiratory distress.  Abdominal: Soft. There is no tenderness.  Vitals reviewed.   Lab Results:  Recent Labs    06/30/18 1035  WBC 12.3*  HGB 12.6  HCT 41.8  PLT 331   BMET Recent Labs     06/30/18 1035  NA 137  K 4.1  CL 103  CO2 22  GLUCOSE 149*  BUN 18  CREATININE 1.34*  CALCIUM 9.5   PT/INR No results for input(s): LABPROT, INR in the last 72 hours. ABG No results for input(s): PHART, HCO3 in the last 72 hours.  Invalid input(s): PCO2, PO2  Studies/Results: Dg C-arm 1-60 Min-no Report  Result Date: 06/30/2018 Fluoroscopy was utilized by the requesting physician.  No radiographic interpretation.   Ct Renal Stone Study  Result Date: 06/30/2018 CLINICAL DATA:  Right flank pain with nausea and vomiting for 24 hours. EXAM: CT ABDOMEN AND PELVIS WITHOUT CONTRAST TECHNIQUE: Multidetector CT imaging of the abdomen and pelvis was performed following the standard protocol without IV contrast. COMPARISON:  None. FINDINGS: Lower chest: Heart size is normal. No pericardial effusion or significant visible coronary artery calcifications. Lung bases show minimal scarring/atelectasis and are otherwise unremarkable. Hepatobiliary: The liver is diffusely low attenuation consistent with hepatic steatosis. A 10 millimeter low-attenuation lesion is identified in the LOWER portion of the RIGHT hepatic lobe, too small to characterize on this noncontrast exam. Clearing calcifications are identified within the gallbladder. No other evidence for acute cholecystitis. Pancreas: Unremarkable. No pancreatic ductal dilatation or surrounding inflammatory changes. Spleen: Normal in size.  No focal abnormality. Adrenals/Urinary Tract: The adrenal glands are normal. Intrarenal calcifications are identified bilaterally, largest on the RIGHT measuring 14 millimeters in the LOWER pole region. Largest calculus in the LEFT kidney is 7 millimeters in the LOWER pole region. There is dilatation of the RIGHT ureter secondary to a stone at the ureterovesical junction which measures 7 millimeters. The LEFT ureter is unremarkable in appearance. The bladder and visualized portion of the urethra are normal. Stomach/Bowel:  Small hiatal hernia. Stomach is otherwise normal in appearance. There are colonic diverticula. No acute diverticulitis. The cecum is in the LEFT central abdomen, redundant. The appendix is not well seen. Vascular/Lymphatic: There is atherosclerotic calcification of the abdominal aorta. No aneurysm. No retroperitoneal or mesenteric adenopathy. Reproductive: The uterus is present.  No adnexal mass. Other: No free pelvic fluid. Anterior abdominal wall is unremarkable. Musculoskeletal: There are significant degenerative changes in the LOWER lumbar spine. Disc height loss and vacuum disc identified at L2-3,  L3-4, L4-5, and L5-S1. There is a 6 millimeters anterolisthesis of L4 on L5. IMPRESSION: 1. Obstructing 7 millimeter calculus at the RIGHT ureterovesical junction. 2. Additional nonobstructing stones in both kidneys. 3. Small hiatal hernia. 4.  Aortic atherosclerosis.  (ICD10-I70.0) 5. Hepatic steatosis. 6. Indeterminate small mass in the RIGHT hepatic lobe, likely benign. 7. Colonic diverticulosis without acute diverticulitis. 8. Significant lumbar spondylosis and grade 1 anterolisthesis of L4 on L5. Electronically Signed   By: Norva Pavlov M.D.   On: 06/30/2018 13:49     Assessment and Plan: She is doing well s/p right ureteroscopic stone extraction with stent insertion but does have a low grade temp.   I will get the foley out and reassess later this morning.  Continue ancef.   Right renal stone.   She will need ESWL in 1-2 weeks.       LOS: 0 days    Bjorn Pippin 07/01/[phone] ID: Jennifer Huffman, female   DOB: 09/09/1951, 67 y.o.   MRN: [id number]

## 2018-07-01 NOTE — Discharge Instructions (Signed)
Lithotripsy Lithotripsy is a treatment that can sometimes help eliminate kidney stones and the pain that they cause. A form of lithotripsy, also known as extracorporeal shock wave lithotripsy, is a nonsurgical procedure that crushes a kidney stone with shock waves. These shock waves pass through your body and focus on the kidney stone. They cause the kidney stone to break up while it is still in the urinary tract. This makes it easier for the smaller pieces of stone to pass in the urine. Tell a health care provider about:  Any allergies you have.  All medicines you are taking, including vitamins, herbs, eye drops, creams, and over-the-counter medicines.  Any blood disorders you have.  Any surgeries you have had.  Any medical conditions you have.  Whether you are pregnant or may be pregnant.  Any problems you or family members have had with anesthetic medicines. What are the risks? Generally, this is a safe procedure. However, problems may occur, including:  Infection.  Bleeding of the kidney.  Bruising of the kidney or skin.  Scarring of the kidney, which can lead to: ? Increased blood pressure. ? Poor kidney function. ? Return (recurrence) of kidney stones.  Damage to other structures or organs, such as the liver, colon, spleen, or pancreas.  Blockage (obstruction) of the the tube that carries urine from the kidney to the bladder (ureter).  Failure of the kidney stone to break into pieces (fragments).  What happens before the procedure? Staying hydrated Follow instructions from your health care provider about hydration, which may include:  Up to 2 hours before the procedure - you may continue to drink clear liquids, such as water, clear fruit juice, black coffee, and plain tea.  Eating and drinking restrictions Follow instructions from your health care provider about eating and drinking, which may include:  8 hours before the procedure - stop eating heavy meals or foods  such as meat, fried foods, or fatty foods.  6 hours before the procedure - stop eating light meals or foods, such as toast or cereal.  6 hours before the procedure - stop drinking milk or drinks that contain milk.  2 hours before the procedure - stop drinking clear liquids.  General instructions  Plan to have someone take you home from the hospital or clinic.  Ask your health care provider about: ? Changing or stopping your regular medicines. This is especially important if you are taking diabetes medicines or blood thinners. ? Taking medicines such as aspirin and ibuprofen. These medicines and other NSAIDs can thin your blood. Do not take these medicines for 7 days before your procedure if your health care provider instructs you not to.  You may have tests, such as: ? Blood tests. ? Urine tests. ? Imaging tests, such as a CT scan. What happens during the procedure?  To lower your risk of infection: ? Your health care team will wash or sanitize their hands. ? Your skin will be washed with soap.  An IV tube will be inserted into one of your veins. This tube will give you fluids and medicines.  You will be given one or more of the following: ? A medicine to help you relax (sedative). ? A medicine to make you fall asleep (general anesthetic).  A water-filled cushion may be placed behind your kidney or on your abdomen. In some cases you may be placed in a tub of lukewarm water.  Your body will be positioned in a way that makes it easy to target  the kidney stone.  A flexible tube with holes in it (stent) may be placed in the ureter. This will help keep urine flowing from the kidney if the fragments of the stone have been blocking the ureter.  An X-ray or ultrasound exam will be done to locate your stone.  Shock waves will be aimed at the stone. If you are awake, you may feel a tapping sensation as the shock waves pass through your body. The procedure may vary among health care  providers and hospitals. What happens after the procedure?  You may have an X-ray to see whether the procedure was able to break up the kidney stone and how much of the stone has passed. If large stone fragments remain after treatment, you may need to have a second procedure at a later time.  Your blood pressure, heart rate, breathing rate, and blood oxygen level will be monitored until the medicines you were given have worn off.  You may be given antibiotics or pain medicine as needed.  If a stent was placed in your ureter during surgery, it may stay in place for a few weeks.  You may need strain your urine to collect pieces of the kidney stone for testing.  You will need to drink plenty of water.  Do not drive for 24 hours if you were given a sedative. Summary  Lithotripsy is a treatment that can sometimes help eliminate kidney stones and the pain that they cause.  A form of lithotripsy, also known as extracorporeal shock wave lithotripsy, is a nonsurgical procedure that crushes a kidney stone with shock waves.  Generally, this is a safe procedure. However, problems may occur, including damage to the kidney or other organs, infection, or obstruction of the tube that carries urine from the kidney to the bladder (ureter).  When you go home, you will need to drink plenty of water. You may be asked to strain your urine to collect pieces of the kidney stone for testing.  You will need to hold your aspirin and any Ibuprofen or similar drugs for 72 hours prior to the procedure.   This information is not intended to replace advice given to you by your health care provider. Make sure you discuss any questions you have with your health care provider. Document Released: 09/14/2000 Document Revised: 08/08/2016 Document Reviewed: 08/08/2016 Elsevier Interactive Patient Education  2018 [employer] Ureteral Stent Implantation, Care After Refer to this sheet in the next few weeks. These  instructions provide you with information about caring for yourself after your procedure. Your health care provider may also give you more specific instructions. Your treatment has been planned according to current medical practices, but problems sometimes occur. Call your health care provider if you have any problems or questions after your procedure. What can I expect after the procedure? After the procedure, it is common to have:  Nausea.  Mild pain when you urinate. You may feel this pain in your lower back or lower abdomen. Pain should stop within a few minutes after you urinate. This may last for up to 1 week.  A small amount of blood in your urine for several days.  Follow these instructions at home:  Medicines  Take over-the-counter and prescription medicines only as told by your health care provider.  If you were prescribed an antibiotic medicine, take it as told by your health care provider. Do not stop taking the antibiotic even if you start to feel better.  Do not drive for  24 hours if you received a sedative.  Do not drive or operate heavy machinery while taking prescription pain medicines. Activity  Return to your normal activities as told by your health care provider. Ask your health care provider what activities are safe for you.  Do not lift anything that is heavier than 10 lb (4.5 kg). Follow this limit for 1 week after your procedure, or for as long as told by your health care provider. General instructions  Watch for any blood in your urine. Call your health care provider if the amount of blood in your urine increases.  If you have a catheter: ? Follow instructions from your health care provider about taking care of your catheter and collection bag. ? Do not take baths, swim, or use a hot tub until your health care provider approves.  Drink enough fluid to keep your urine clear or pale yellow.  Keep all follow-up visits as told by your health care provider.  This is important. Contact a health care provider if:  You have pain that gets worse or does not get better with medicine, especially pain when you urinate.  You have difficulty urinating.  You feel nauseous or you vomit repeatedly during a period of more than 2 days after the procedure. Get help right away if:  Your urine is dark red or has blood clots in it.  You are leaking urine (have incontinence).  The end of the stent comes out of your urethra.  You cannot urinate.  You have sudden, sharp, or severe pain in your abdomen or lower back.  You have a fever. This information is not intended to replace advice given to you by your health care provider. Make sure you discuss any questions you have with your health care provider. Document Released: 05/20/2013 Document Revised: 02/23/2016 Document Reviewed: 04/01/2015 Elsevier Interactive Patient Education  [employer].

## 2018-07-01 NOTE — OR Nursing (Signed)
Added patient's daughter's number to patient demographics for contact information. Daughter Elita Quick is to be contacted at [phone] if needed.

## 2018-07-02 LAB — URINE CULTURE: CULTURE: NO GROWTH

## 2018-07-03 DIAGNOSIS — M1712 Unilateral primary osteoarthritis, left knee: Secondary | ICD-10-CM | POA: Diagnosis not present

## 2018-07-03 DIAGNOSIS — M1711 Unilateral primary osteoarthritis, right knee: Secondary | ICD-10-CM | POA: Diagnosis not present

## 2018-07-07 ENCOUNTER — Other Ambulatory Visit: Payer: Self-pay | Admitting: Urology

## 2018-07-23 ENCOUNTER — Encounter (HOSPITAL_COMMUNITY): Payer: Self-pay | Admitting: General Practice

## 2018-07-23 DIAGNOSIS — N200 Calculus of kidney: Secondary | ICD-10-CM | POA: Diagnosis not present

## 2018-07-30 NOTE — H&P (Signed)
Office Visit Report     07/23/2018   --------------------------------------------------------------------------------   Jennifer Huffman  MRN: [id number]  PRIMARY CARE:  Cammie Fulp- No longer in Practice, MD  DOB: 10-Aug-1951, 67 year old Female  REFERRING:  Cammie Fulp- No longer in Practice, MD  SSN: [id number]  PROVIDER:  Vianne Bulls    LOCATION:  [facility] [phone]   --------------------------------------------------------------------------------   CC: I have kidney stones.  HPI: Jennifer Huffman is a 67 year-old female established patient who is here for renal calculi.  07/23/18: Patient with past history of urolithiasis who had left URS at [facility] about one month ago. She presented to ED at [facility] on 9/30 with right flank pain. Imaging showed an approximatly 6 mm distal ureteral stone with obstruction and a 14 mm right lower pole stone along with a 10 mm left lower pole stone. She underwent right URS with removal of 6 mm right distal ureteral calculus and 6 X 24 ureteral stent placement. She is scheduled for right ESWL on 10/31. Urine culture from 9/30 showed only insignificant growth and urine culture sent during cystoscopy showed no growth. Creatinine was noted to be 1.11 on 10/1. Today she states that she has been doing well following recent procedure. She denies any current flank pain or abdominal pain. She denies exacerbation of urgency or frequency. She has had some intermittent hematuria on occasion. She denies difficulties voiding. No dysuria, fever, chills, nausea, or vomiting. She is not on blood thinners. She denies cardiac history. She does have asthma, which is well controlled. No reports of sleep apnea.    The problem is on the right side.     ALLERGIES: None   MEDICATIONS: Levothyroxine Sodium 150 mcg tablet  Metformin Hcl 500 mg tablet  Advair Diskus  Amlodipine Besylate 5 mg tablet  Atorvastatin Calcium 10 mg tablet  Centrum  Adults  Furosemide 40 mg tablet  Gabapentin 300 mg capsule  Hydrocodone-Acetaminophen 5 mg-325 mg tablet  Meloxicam 15 mg tablet  Potassium Chloride 10 meq tablet, extended release  Telmisartan 80 mg tablet tablet  Vitamin D3     GU PSH: Cystoscopy Insert Stent, Right - 06/30/2018 Hysterectomy - about 1999 Ureteroscopic stone removal, Right - 06/30/2018    NON-GU PSH: None   GU PMH: None   NON-GU PMH: Arthritis Asthma Hypercholesterolemia Hypertension    FAMILY HISTORY: 1 Daughter - Daughter 1 son - Son Cancer of unknown origin - Mother, Sister   SOCIAL HISTORY: Marital Status: Single Preferred Language: English; Ethnicity: Not Hispanic Or Latino; Race: Black or African American Current Smoking Status: Patient has never smoked.   Tobacco Use Assessment Completed: Used Tobacco in last 30 days? Has never drank.  Drinks 1 caffeinated drink per day. Patient's occupation is/was retired.    REVIEW OF SYSTEMS:    GU Review Female:   Patient denies frequent urination, hard to postpone urination, burning /pain with urination, get up at night to urinate, leakage of urine, stream starts and stops, trouble starting your stream, have to strain to urinate, and being pregnant.  Gastrointestinal (Upper):   Patient denies nausea, vomiting, and indigestion/ heartburn.  Gastrointestinal (Lower):   Patient denies diarrhea and constipation.  Constitutional:   Patient denies fever, night sweats, weight loss, and fatigue.  Skin:   Patient denies skin rash/ lesion and itching.  Eyes:   Patient denies blurred vision and double vision.  Ears/ Nose/ Throat:   Patient denies sore throat and sinus problems.  Hematologic/Lymphatic:   Patient denies swollen glands and easy bruising.  Cardiovascular:   Patient reports leg swelling. Patient denies chest pains.  Respiratory:   Patient denies cough and shortness of breath.  Endocrine:   Patient denies excessive thirst.  Musculoskeletal:   Patient  denies back pain and joint pain.  Neurological:   Patient denies headaches and dizziness.  Psychologic:   Patient denies depression and anxiety.   VITAL SIGNS:      07/23/2018 01:14 PM  Weight 300 lb / 136.08 kg  Height 61 in / 154.94 cm  BP 127/60 mmHg  Pulse 71 /min  Temperature 98.0 F / 36.6 C  BMI 56.7 kg/m   MULTI-SYSTEM PHYSICAL EXAMINATION:    Constitutional: Obese. No physical deformities. Normally developed. Good grooming.   Respiratory: Normal breath sounds. No labored breathing, no use of accessory muscles.   Cardiovascular: Regular rate and rhythm. No murmur, no gallop. Normal temperature, normal extremity pulses, no swelling, no varicosities.   Skin: No paleness, no jaundice, no cyanosis. No lesion, no ulcer, no rash.  Neurologic / Psychiatric: Oriented to time, oriented to place, oriented to person. No depression, no anxiety, no agitation.  Gastrointestinal: Obese abdomen. No mass, no tenderness, no rigidity. No CVAT.   Musculoskeletal: Normal gait and station of head and neck.     PAST DATA REVIEWED:  Source Of History:  Patient  Lab Test Review:   BMP  Records Review:   Previous Patient Records  Urine Test Review:   Urinalysis, Urine Culture  X-Ray Review: C.T. Abdomen/Pelvis: Reviewed Films. Reviewed Report.     PROCEDURES:          Urinalysis w/Scope Dipstick Dipstick Cont'd Micro  Color: Amber Bilirubin: Neg mg/dL WBC/hpf: 10 - 16/XWR  Appearance: Cloudy Ketones: Neg mg/dL RBC/hpf: >60/AVW  Specific Gravity: 1.025 Blood: 3+ ery/uL Bacteria: Few (10-25/hpf)  pH: 5.5 Protein: 2+ mg/dL Cystals: NS (Not Seen)  Glucose: Neg mg/dL Urobilinogen: 0.2 mg/dL Casts: NS (Not Seen)    Nitrites: Neg Trichomonas: Not Present    Leukocyte Esterase: 2+ leu/uL Mucous: Not Present      Epithelial Cells: 0 - 5/hpf      Yeast: NS (Not Seen)      Sperm: Not Present    ASSESSMENT:      ICD-10 Details  1 GU:   Renal calculus - N20.0    PLAN:           Orders Labs  Urine Culture          Document Letter(s):  Created for Patient: Clinical Summary         Notes:   I will send urine for culture today. If positive, I will plan to prescribe accordingly given her upcoming procedure. We discussed upcoming right ESWL in detail. I again reviewed indications and risks in detail today. I answered all of her questions to the best of my ability today. I encouraged that she continue to hydrate well. Return precuations reviewed for fever or progressive symptoms. She voiced understanding.         Next Appointment:      Next Appointment: 07/31/2018 07:30 AM    Appointment Type: Surgery     Location: [facility] [phone]    Provider: Heloise Purpura, M.D.    Reason for Visit: WL/OP RT ESWL (Pt Requested this date)      * Signed by Vianne Bulls on 07/23/18 at 4:31 PM (EDT)*

## 2018-07-31 ENCOUNTER — Ambulatory Visit (HOSPITAL_COMMUNITY): Payer: Medicare Other

## 2018-07-31 ENCOUNTER — Ambulatory Visit (HOSPITAL_COMMUNITY)
Admission: RE | Admit: 2018-07-31 | Discharge: 2018-07-31 | Disposition: A | Discharge status: Home / Self Care | Payer: Medicare Other | Source: Ambulatory Visit | Attending: Urology | Admitting: Urology

## 2018-07-31 ENCOUNTER — Other Ambulatory Visit: Payer: Self-pay

## 2018-07-31 ENCOUNTER — Encounter (HOSPITAL_COMMUNITY): Payer: Self-pay | Admitting: *Deleted

## 2018-07-31 ENCOUNTER — Encounter (HOSPITAL_COMMUNITY)
Admission: RE | Disposition: A | Discharge status: Home / Self Care | Payer: Self-pay | Source: Ambulatory Visit | Attending: Urology

## 2018-07-31 DIAGNOSIS — M1990 Unspecified osteoarthritis, unspecified site: Secondary | ICD-10-CM | POA: Insufficient documentation

## 2018-07-31 DIAGNOSIS — E669 Obesity, unspecified: Secondary | ICD-10-CM | POA: Diagnosis not present

## 2018-07-31 DIAGNOSIS — Z87442 Personal history of urinary calculi: Secondary | ICD-10-CM | POA: Diagnosis not present

## 2018-07-31 DIAGNOSIS — J45909 Unspecified asthma, uncomplicated: Secondary | ICD-10-CM | POA: Diagnosis not present

## 2018-07-31 DIAGNOSIS — I10 Essential (primary) hypertension: Secondary | ICD-10-CM | POA: Insufficient documentation

## 2018-07-31 DIAGNOSIS — E7800 Pure hypercholesterolemia, unspecified: Secondary | ICD-10-CM | POA: Insufficient documentation

## 2018-07-31 DIAGNOSIS — Z79899 Other long term (current) drug therapy: Secondary | ICD-10-CM | POA: Insufficient documentation

## 2018-07-31 DIAGNOSIS — Z8489 Family history of other specified conditions: Secondary | ICD-10-CM | POA: Insufficient documentation

## 2018-07-31 DIAGNOSIS — N202 Calculus of kidney with calculus of ureter: Secondary | ICD-10-CM | POA: Diagnosis present

## 2018-07-31 DIAGNOSIS — N200 Calculus of kidney: Secondary | ICD-10-CM

## 2018-07-31 DIAGNOSIS — E119 Type 2 diabetes mellitus without complications: Secondary | ICD-10-CM | POA: Diagnosis not present

## 2018-07-31 HISTORY — PX: EXTRACORPOREAL SHOCK WAVE LITHOTRIPSY: SHX1557

## 2018-07-31 HISTORY — DX: Personal history of urinary calculi: Z87.442

## 2018-07-31 LAB — GLUCOSE, CAPILLARY: Glucose-Capillary: 120 mg/dL — ABNORMAL HIGH (ref 70–99)

## 2018-07-31 SURGERY — LITHOTRIPSY, ESWL
Anesthesia: LOCAL | Laterality: Right

## 2018-07-31 MED ORDER — DIPHENHYDRAMINE HCL 25 MG PO CAPS
25.0000 mg | ORAL_CAPSULE | ORAL | Status: AC
  Administered 2018-07-31: 25 mg via ORAL

## 2018-07-31 MED ORDER — DIAZEPAM 5 MG PO TABS
10.0000 mg | ORAL_TABLET | ORAL | Status: DC
  Filled 2018-07-31: qty 2

## 2018-07-31 MED ORDER — SODIUM CHLORIDE 0.9 % IV SOLN: INTRAVENOUS | Status: DC

## 2018-07-31 MED ORDER — CIPROFLOXACIN HCL 500 MG PO TABS
500.0000 mg | ORAL_TABLET | ORAL | Status: AC
  Administered 2018-07-31: 500 mg via ORAL

## 2018-07-31 MED ORDER — CIPROFLOXACIN HCL 500 MG PO TABS
500.0000 mg | ORAL_TABLET | ORAL | Status: DC
  Filled 2018-07-31: qty 1

## 2018-07-31 MED ORDER — DIPHENHYDRAMINE HCL 25 MG PO CAPS
25.0000 mg | ORAL_CAPSULE | ORAL | Status: DC
  Filled 2018-07-31: qty 1

## 2018-07-31 MED ORDER — SODIUM CHLORIDE 0.9 % IV SOLN
INTRAVENOUS | Status: DC
  Administered 2018-07-31: 07:00:00 via INTRAVENOUS

## 2018-07-31 MED ORDER — DIAZEPAM 5 MG PO TABS
10.0000 mg | ORAL_TABLET | ORAL | Status: AC
  Administered 2018-07-31: 10 mg via ORAL

## 2018-07-31 NOTE — Discharge Instructions (Addendum)
1. You should strain your urine and collect all fragments and bring them to your follow up appointment.  2. You should take your pain medication as needed.  Please call if your pain is severe to the point that it is not controlled with your pain medication. 3. You should call if you develop fever > 101 or persistent nausea or vomiting.   

## 2018-07-31 NOTE — Op Note (Signed)
See [facility] operative note scanned into chart. Also because of the size, density, location and other factors that cannot be anticipated I feel this will likely be a staged procedure. This fact supersedes any indication in the scanned [facility] operative note to the contrary.  

## 2018-07-31 NOTE — Interval H&P Note (Signed)
History and Physical Interval Note:  07/31/2018 8:19 AM  Jennifer Huffman  has presented today for surgery, with the diagnosis of right renal stone  The various methods of treatment have been discussed with the patient and family. After consideration of risks, benefits and other options for treatment, the patient has consented to  Procedure(s): RIGHT EXTRACORPOREAL SHOCK WAVE LITHOTRIPSY (ESWL) (Right) as a surgical intervention .  The patient's history has been reviewed, patient examined, no change in status, stable for surgery.  I have reviewed the patient's chart and labs.  Questions were answered to the patient's satisfaction.     Brice Kossman,LES

## 2018-08-01 ENCOUNTER — Encounter (HOSPITAL_COMMUNITY): Payer: Self-pay | Admitting: Urology

## 2018-08-12 DIAGNOSIS — N200 Calculus of kidney: Secondary | ICD-10-CM | POA: Diagnosis not present

## 2018-08-13 DIAGNOSIS — L989 Disorder of the skin and subcutaneous tissue, unspecified: Secondary | ICD-10-CM | POA: Diagnosis not present

## 2018-08-13 DIAGNOSIS — N200 Calculus of kidney: Secondary | ICD-10-CM | POA: Diagnosis not present

## 2018-08-13 DIAGNOSIS — R070 Pain in throat: Secondary | ICD-10-CM | POA: Diagnosis not present

## 2018-08-13 DIAGNOSIS — I10 Essential (primary) hypertension: Secondary | ICD-10-CM | POA: Diagnosis not present

## 2018-08-13 DIAGNOSIS — M170 Bilateral primary osteoarthritis of knee: Secondary | ICD-10-CM | POA: Diagnosis not present

## 2018-08-19 DIAGNOSIS — N200 Calculus of kidney: Secondary | ICD-10-CM | POA: Diagnosis not present

## 2018-09-08 DIAGNOSIS — L910 Hypertrophic scar: Secondary | ICD-10-CM | POA: Diagnosis not present

## 2018-09-09 DIAGNOSIS — N200 Calculus of kidney: Secondary | ICD-10-CM | POA: Diagnosis not present

## 2018-10-06 DIAGNOSIS — L910 Hypertrophic scar: Secondary | ICD-10-CM | POA: Diagnosis not present

## 2018-10-08 ENCOUNTER — Other Ambulatory Visit: Payer: Self-pay | Admitting: Internal Medicine

## 2018-10-08 ENCOUNTER — Ambulatory Visit
Admission: RE | Admit: 2018-10-08 | Discharge: 2018-10-08 | Disposition: A | Discharge status: Home / Self Care | Payer: Medicare Other | Source: Ambulatory Visit | Attending: Internal Medicine | Admitting: Internal Medicine

## 2018-10-08 DIAGNOSIS — Z23 Encounter for immunization: Secondary | ICD-10-CM | POA: Diagnosis not present

## 2018-10-08 DIAGNOSIS — R238 Other skin changes: Secondary | ICD-10-CM | POA: Diagnosis not present

## 2018-10-08 DIAGNOSIS — R490 Dysphonia: Secondary | ICD-10-CM

## 2018-10-08 DIAGNOSIS — I517 Cardiomegaly: Secondary | ICD-10-CM | POA: Diagnosis not present

## 2018-10-13 DIAGNOSIS — N200 Calculus of kidney: Secondary | ICD-10-CM | POA: Diagnosis not present

## 2018-10-15 ENCOUNTER — Other Ambulatory Visit: Payer: Self-pay | Admitting: Internal Medicine

## 2018-10-27 DIAGNOSIS — H2513 Age-related nuclear cataract, bilateral: Secondary | ICD-10-CM | POA: Diagnosis not present

## 2018-10-27 DIAGNOSIS — H2511 Age-related nuclear cataract, right eye: Secondary | ICD-10-CM | POA: Diagnosis not present

## 2018-11-03 DIAGNOSIS — K210 Gastro-esophageal reflux disease with esophagitis: Secondary | ICD-10-CM | POA: Diagnosis not present

## 2018-11-03 DIAGNOSIS — K219 Gastro-esophageal reflux disease without esophagitis: Secondary | ICD-10-CM | POA: Diagnosis not present

## 2018-11-05 DIAGNOSIS — N200 Calculus of kidney: Secondary | ICD-10-CM | POA: Diagnosis not present

## 2018-11-13 DIAGNOSIS — M1711 Unilateral primary osteoarthritis, right knee: Secondary | ICD-10-CM | POA: Diagnosis not present

## 2018-11-13 DIAGNOSIS — Z6841 Body Mass Index (BMI) 40.0 and over, adult: Secondary | ICD-10-CM | POA: Diagnosis not present

## 2018-11-13 DIAGNOSIS — M1712 Unilateral primary osteoarthritis, left knee: Secondary | ICD-10-CM | POA: Diagnosis not present

## 2018-11-18 DIAGNOSIS — M13861 Other specified arthritis, right knee: Secondary | ICD-10-CM | POA: Diagnosis not present

## 2018-11-18 DIAGNOSIS — H25813 Combined forms of age-related cataract, bilateral: Secondary | ICD-10-CM | POA: Diagnosis not present

## 2018-11-18 DIAGNOSIS — J45909 Unspecified asthma, uncomplicated: Secondary | ICD-10-CM | POA: Diagnosis not present

## 2018-11-18 DIAGNOSIS — Z79899 Other long term (current) drug therapy: Secondary | ICD-10-CM | POA: Diagnosis not present

## 2018-11-18 DIAGNOSIS — E785 Hyperlipidemia, unspecified: Secondary | ICD-10-CM | POA: Diagnosis not present

## 2018-11-18 DIAGNOSIS — M13862 Other specified arthritis, left knee: Secondary | ICD-10-CM | POA: Diagnosis not present

## 2018-11-18 DIAGNOSIS — I890 Lymphedema, not elsewhere classified: Secondary | ICD-10-CM | POA: Diagnosis not present

## 2018-11-18 DIAGNOSIS — E079 Disorder of thyroid, unspecified: Secondary | ICD-10-CM | POA: Diagnosis not present

## 2018-11-18 DIAGNOSIS — H2511 Age-related nuclear cataract, right eye: Secondary | ICD-10-CM | POA: Diagnosis not present

## 2018-11-18 DIAGNOSIS — Z7984 Long term (current) use of oral hypoglycemic drugs: Secondary | ICD-10-CM | POA: Diagnosis not present

## 2018-11-18 DIAGNOSIS — I10 Essential (primary) hypertension: Secondary | ICD-10-CM | POA: Diagnosis not present

## 2018-11-19 DIAGNOSIS — H2512 Age-related nuclear cataract, left eye: Secondary | ICD-10-CM | POA: Diagnosis not present

## 2018-11-24 ENCOUNTER — Encounter: Payer: Self-pay | Admitting: Internal Medicine

## 2018-11-24 ENCOUNTER — Ambulatory Visit (INDEPENDENT_AMBULATORY_CARE_PROVIDER_SITE_OTHER): Payer: Medicare Other | Admitting: Internal Medicine

## 2018-11-24 VITALS — BP 128/82 | HR 68 | Ht 61.0 in | Wt 321.0 lb

## 2018-11-24 DIAGNOSIS — E039 Hypothyroidism, unspecified: Secondary | ICD-10-CM | POA: Diagnosis not present

## 2018-11-24 DIAGNOSIS — E785 Hyperlipidemia, unspecified: Secondary | ICD-10-CM | POA: Diagnosis not present

## 2018-11-24 DIAGNOSIS — R7303 Prediabetes: Secondary | ICD-10-CM | POA: Diagnosis not present

## 2018-11-24 LAB — POCT GLYCOSYLATED HEMOGLOBIN (HGB A1C): Hemoglobin A1C: 6.2 % — AB (ref 4.0–5.6)

## 2018-11-24 MED ORDER — ATORVASTATIN CALCIUM 80 MG PO TABS: 80.0000 mg | ORAL_TABLET | Freq: Every day | ORAL | 3 refills | Status: DC

## 2018-11-24 NOTE — Addendum Note (Signed)
Addended by: Darliss Ridgel I on: 11/24/2018 03:08 PM   Modules accepted: Orders

## 2018-11-24 NOTE — Progress Notes (Signed)
Patient ID: Jennifer Huffman, female   DOB: Jan 22, 1951, 68 y.o.   MRN: [id number]  HPI: Jennifer Huffman is a 68 y.o.-year-old female, returning for f/u for prediabetes, dx in ~2014, diet-controlled uncontrolled, without complications. She moved from [place] in 08/2015. Last visit 6 months ago.  She had another kidney stone episode >> had lithotripsy 07/2018.  She also had cataract sx 11/18/2018 and will have the other one extracted 12/02/2018.  Prediabetes: Last hemoglobin A1c was: 05/22/2018: HbA1c 5.8% Lab Results  Component Value Date   HGBA1C 6.1 11/15/2017   HGBA1C 6.2 05/16/2017   HGBA1C 6.1 11/16/2016  10/31/2015: HbA1c 6.5%  Pt is on: - Metformin 500 mg 2x a day >> started 08/2017.  Denies any GI side effects.  She is still not checking sugars at home. She has a FreeStyle Libre CGM >> irritated her skin >> she stopped using it.  Glucometer: [employer] Next  -No history of CKD, last BUN/creatinine:  Lab Results  Component Value Date   BUN 15 07/01/2018   Lab Results  Component Value Date   CREATININE 1.11 (H) 07/01/2018  05/08/2018: 16.4/1.10, ACR 1.0 07/03/2016: 11/0.83, glucose 98 10/31/2015: 16/0.87, Glucose 113 On telmisartan 80.  -+ HL; last set of lipids: 04/01/2018: 195/105/60/114 Lab Results  Component Value Date   CHOL 179 12/21/2016   HDL 51.60 12/21/2016   LDLCALC 102 (H) 12/21/2016   TRIG 128.0 12/21/2016   CHOLHDL 3 12/21/2016  10/31/2015: 175/82/50/104 On atorvastatin.  - last eye exam 10/2018: No DR, + cataract >> had one Sx already.   -She denies numbness and tingling in her feet.  On ASA 81.  She has a history of HTN, lymphedema, osteoarthritis of knees, mild intermittent asthma, eczema.   Hypothyroidism:  Latest TSH level was reviewed and this was normal.  04/01/2018: TSH 3.80 Lab Results  Component Value Date   TSH 3.08 07/04/2017   TSH 1.27 12/21/2016  03/06/2016: TSH 4.74 10/31/2015: TSH 4.36 (0.34-4.5)  Pt is on  levothyroxine 150 mcg daily, taken: - in am - fasting - at least 30 min from b'fast - no Ca, Fe, PPIs - takes MVI later in the day - not on Biotin  She has a kidney stone removed - 05/08/2018.   ROS: Constitutional: no weight gain/no weight loss, no fatigue, no subjective hyperthermia, no subjective hypothermia Eyes: no blurry vision, no xerophthalmia ENT: no sore throat, + see HPI Cardiovascular: no CP/no SOB/no palpitations/++ leg swelling Respiratory: no cough/no SOB/no wheezing Gastrointestinal: no N/no V/no D/no C/no acid reflux Musculoskeletal: no muscle aches/no joint aches Skin: no rashes, no hair loss Neurological: no tremors/no numbness/no tingling/no dizziness  I reviewed pt's medications, allergies, PMH, social hx, family hx, and changes were documented in the history of present illness. Otherwise, unchanged from my initial visit note.  Past Medical History:  Diagnosis Date  . Asthma, mild intermittent   . Blurred vision, bilateral   . Diabetes mellitus without complication (HCC)    type 2  . Dyspnea    with exertion  . History of kidney stones   . Hyperlipidemia   . Hypertension   . Hypothyroidism   . Lymphedema of both lower extremities    compressions hose when out anf wraps at home  . Neuromuscular disorder (HCC)   . Osteoarthritis of both knees    Past Surgical History:  Procedure Laterality Date  . ABDOMINAL HYSTERECTOMY     partial  . COLONOSCOPY WITH PROPOFOL N/A 01/17/2017   Procedure: COLONOSCOPY WITH PROPOFOL;  Surgeon: Charlott Rakes, MD;  Location: [facility];  Service: Endoscopy;  Laterality: N/A;  . CYSTOSCOPY/RETROGRADE/URETEROSCOPY/STONE EXTRACTION WITH BASKET Right 06/30/2018   Procedure: CYSTOSCOPY/RETROGRADE/URETEROSCOPY/STONE EXTRACTION WITH BASKET INSERTION DOUBLE J STENT;  Surgeon: Bjorn Pippin, MD;  Location: [facility];  Service: Urology;  Laterality: Right;  . EXTRACORPOREAL SHOCK WAVE LITHOTRIPSY Right 07/31/2018   Procedure: RIGHT  EXTRACORPOREAL SHOCK WAVE LITHOTRIPSY (ESWL);  Surgeon: Heloise Purpura, MD;  Location: [facility];  Service: Urology;  Laterality: Right;  . HOLMIUM LASER APPLICATION Right 06/30/2018   Procedure: HOLMIUM LASER APPLICATION;  Surgeon: Bjorn Pippin, MD;  Location: [facility];  Service: Urology;  Laterality: Right;  . TUBAL LIGATION    . URETEROSCOPY WITH HOLMIUM LASER LITHOTRIPSY Left 05/07/2018   Social History   Social History  . Marital Status: Legally Separated    Spouse Name: N/A  . Number of Children: 2   Occupational History  . retired   Social History Main Topics  . Smoking status: Never Smoker   . Smokeless tobacco: Never Used  . Alcohol Use: Vodka 1-2 drinks socially  . Drug Use: No   Current Outpatient Medications on File Prior to Visit  Medication Sig Dispense Refill  . albuterol (PROVENTIL HFA;VENTOLIN HFA) 108 (90 Base) MCG/ACT inhaler Inhale 2 puffs into the lungs every 4 (four) hours as needed for wheezing or shortness of breath.    Marland Kitchen amLODipine (NORVASC) 5 MG tablet Take 5 mg by mouth daily.    Marland Kitchen aspirin 81 MG tablet Take 81 mg by mouth daily.    Marland Kitchen BAYER MICROLET LANCETS lancets Use as instructed used to check one time a day 100 each 5  . Fluticasone-Salmeterol (ADVAIR) 250-50 MCG/DOSE AEPB Inhale 1 puff into the lungs 2 (two) times daily.     . furosemide (LASIX) 40 MG tablet Take 40 mg by mouth daily.    Marland Kitchen gabapentin (NEURONTIN) 300 MG capsule START 1 CAPSULE AT BEDTIME FOR 1 TIME A WEEK, THEN INCREASE TO 1 TABLET TWICE DAILY (Patient taking differently: Take 300 mg by mouth 2 (two) times daily. START 1 CAPSULE AT BEDTIME FOR 1 TIME A WEEK, THEN INCREASE TO 1 TABLET TWICE DAILY) 180 capsule 2  . glucose blood (BAYER CONTOUR NEXT TEST) test strip Use as instructed testing one time a day. 100 each 5  . HYDROcodone-acetaminophen (NORCO) 5-325 MG tablet Take 1 tablet by mouth every 6 (six) hours as needed for moderate pain. 15 tablet 0  . levothyroxine (SYNTHROID, LEVOTHROID) 150  MCG tablet TAKE 1 TABLET (150 MCG TOTAL) BY MOUTH DAILY BEFORE BREAKFAST. 90 tablet 1  . metFORMIN (GLUCOPHAGE) 500 MG tablet TAKE 1 TABLET BY MOUTH TWICE A DAY WITH A MEAL (Patient taking differently: Take 500 mg by mouth 2 (two) times daily with a meal. ) 180 tablet 2  . Multiple Vitamins-Minerals (MULTIVITAMIN WITH MINERALS) tablet Take 1 tablet by mouth daily.    . potassium chloride (KLOR-CON 10) 10 MEQ tablet Take 20 mEq by mouth daily.     . tamsulosin (FLOMAX) 0.4 MG CAPS capsule Take 0.4 mg by mouth daily.    Marland Kitchen telmisartan (MICARDIS) 80 MG tablet Take 80 mg by mouth daily.    . traMADol (ULTRAM) 50 MG tablet Take 1 tablet (50 mg total) by mouth every 6 (six) hours as needed. 30 tablet 0   No current facility-administered medications on file prior to visit.    No Known Allergies Family History  Problem Relation Age of Onset  . Cancer Mother  breast  . Breast cancer Mother 63  . Hypertension Father   . Breast cancer Sister 57  . Breast cancer Sister 20   PE: BP 128/82   Pulse 68   Ht 5\' 1"  (1.549 m)   Wt (!) 321 lb (145.6 kg)   SpO2 95%   BMI 60.65 kg/m  Wt Readings from Last 3 Encounters:  11/24/18 (!) 321 lb (145.6 kg)  06/30/18 (!) 302 lb (137 kg)  05/22/18 (!) 319 lb 12.8 oz (145.1 kg)   Constitutional: overweight, in NAD, walks with a walker Eyes: PERRLA, EOMI, no exophthalmos ENT: moist mucous membranes, no thyromegaly, no cervical lymphadenopathy Cardiovascular: RRR, No MRG, + bilateral significant lymphedema-legs both edematous and wrapped, with right leg larger than the left Respiratory: CTA B Gastrointestinal: abdomen soft, NT, ND, BS+ Musculoskeletal: no deformities, strength intact in all 4 Skin: moist, warm, no rashes Neurological: no tremor with outstretched hands, DTR normal in all 4  ASSESSMENT: 1. Prediabetes  2. Hypothyroidism  3.  HL  PLAN:  1. Patient with history of prediabetes, with latest HbA1c from last visit, improved to 5.8%.   She continues on metformin 500 mg twice a day, which appears to be working well for her.  She is tolerating this well.  I repeatedly advised her to check sugars at home, but she is still not doing so.  I again advised her to try to check sugars at least in the 2-week period before next visit. -She continues to go to the gym  -We will continue just metformin for now - I suggested to:  Patient Instructions  Please continue: - Metformin 500 mg 2x a day with meals.  Continue Levothyroxine 150 mcg daily.  Take the thyroid hormone every day, with water, at least 30 minutes before breakfast, separated by at least 4 hours from: - acid reflux medications - calcium - iron - multivitamins  Please come back for a follow-up appointment in 6 months.  - today, HbA1c is 6.2% (higher) -Start checking sugars at different times of the day - check 1x a day, rotating checks - advised for yearly eye exams >> she is UTD - Return to clinic in 6 mo with sugar log     2. Hypothyroidism - latest thyroid labs reviewed with pt >> normal in 03/2018 - she continues on LT4 150 mcg daily - pt feels good on this dose. - we discussed about taking the thyroid hormone every day, with water, >30 minutes before breakfast, separated by >4 hours from acid reflux medications, calcium, iron, multivitamins. Pt. is taking it correctly.  3.  HL - Reviewed latest lipid panel from 2019: LDL above target and higher than before - ?  Incomplete compliance - Continues atorvastatin without side effects.   Carlus Pavlov, MD PhD [facility]

## 2018-11-24 NOTE — Patient Instructions (Addendum)
Please continue: - Metformin 500 mg 2x a day with meals.  Continue Levothyroxine 150 mcg daily.  Take the thyroid hormone every day, with water, at least 30 minutes before breakfast, separated by at least 4 hours from: - acid reflux medications - calcium - iron - multivitamins  Please start checking sugars.  Please come back for a follow-up appointment in 6 months.

## 2018-12-02 DIAGNOSIS — Z6841 Body Mass Index (BMI) 40.0 and over, adult: Secondary | ICD-10-CM | POA: Diagnosis not present

## 2018-12-02 DIAGNOSIS — H25012 Cortical age-related cataract, left eye: Secondary | ICD-10-CM | POA: Diagnosis not present

## 2018-12-02 DIAGNOSIS — I10 Essential (primary) hypertension: Secondary | ICD-10-CM | POA: Diagnosis not present

## 2018-12-02 DIAGNOSIS — Z79899 Other long term (current) drug therapy: Secondary | ICD-10-CM | POA: Diagnosis not present

## 2018-12-02 DIAGNOSIS — Z7982 Long term (current) use of aspirin: Secondary | ICD-10-CM | POA: Diagnosis not present

## 2018-12-02 DIAGNOSIS — E785 Hyperlipidemia, unspecified: Secondary | ICD-10-CM | POA: Diagnosis not present

## 2018-12-02 DIAGNOSIS — Z9841 Cataract extraction status, right eye: Secondary | ICD-10-CM | POA: Diagnosis not present

## 2018-12-02 DIAGNOSIS — J45909 Unspecified asthma, uncomplicated: Secondary | ICD-10-CM | POA: Diagnosis not present

## 2018-12-02 DIAGNOSIS — Z961 Presence of intraocular lens: Secondary | ICD-10-CM | POA: Diagnosis not present

## 2018-12-02 DIAGNOSIS — E039 Hypothyroidism, unspecified: Secondary | ICD-10-CM | POA: Diagnosis not present

## 2018-12-02 DIAGNOSIS — M170 Bilateral primary osteoarthritis of knee: Secondary | ICD-10-CM | POA: Diagnosis not present

## 2018-12-02 DIAGNOSIS — Z791 Long term (current) use of non-steroidal anti-inflammatories (NSAID): Secondary | ICD-10-CM | POA: Diagnosis not present

## 2018-12-02 DIAGNOSIS — H2512 Age-related nuclear cataract, left eye: Secondary | ICD-10-CM | POA: Diagnosis not present

## 2018-12-02 DIAGNOSIS — E1136 Type 2 diabetes mellitus with diabetic cataract: Secondary | ICD-10-CM | POA: Diagnosis not present

## 2018-12-02 DIAGNOSIS — Z7951 Long term (current) use of inhaled steroids: Secondary | ICD-10-CM | POA: Diagnosis not present

## 2018-12-02 DIAGNOSIS — Z7984 Long term (current) use of oral hypoglycemic drugs: Secondary | ICD-10-CM | POA: Diagnosis not present

## 2019-01-12 ENCOUNTER — Other Ambulatory Visit: Payer: Self-pay | Admitting: Internal Medicine

## 2019-02-05 ENCOUNTER — Other Ambulatory Visit: Payer: Self-pay | Admitting: Neurology

## 2019-03-03 ENCOUNTER — Other Ambulatory Visit: Payer: Self-pay | Admitting: Neurology

## 2019-03-03 DIAGNOSIS — M1711 Unilateral primary osteoarthritis, right knee: Secondary | ICD-10-CM | POA: Diagnosis not present

## 2019-03-03 DIAGNOSIS — M1712 Unilateral primary osteoarthritis, left knee: Secondary | ICD-10-CM | POA: Diagnosis not present

## 2019-03-03 DIAGNOSIS — Z6841 Body Mass Index (BMI) 40.0 and over, adult: Secondary | ICD-10-CM | POA: Diagnosis not present

## 2019-03-08 ENCOUNTER — Other Ambulatory Visit: Payer: Self-pay | Admitting: Neurology

## 2019-03-23 DIAGNOSIS — H26492 Other secondary cataract, left eye: Secondary | ICD-10-CM | POA: Diagnosis not present

## 2019-03-23 DIAGNOSIS — H26491 Other secondary cataract, right eye: Secondary | ICD-10-CM | POA: Diagnosis not present

## 2019-04-04 ENCOUNTER — Other Ambulatory Visit: Payer: Self-pay | Admitting: Neurology

## 2019-04-07 DIAGNOSIS — Z0000 Encounter for general adult medical examination without abnormal findings: Secondary | ICD-10-CM | POA: Diagnosis not present

## 2019-04-07 DIAGNOSIS — I890 Lymphedema, not elsewhere classified: Secondary | ICD-10-CM | POA: Diagnosis not present

## 2019-04-07 DIAGNOSIS — I10 Essential (primary) hypertension: Secondary | ICD-10-CM | POA: Diagnosis not present

## 2019-04-07 DIAGNOSIS — R7303 Prediabetes: Secondary | ICD-10-CM | POA: Diagnosis not present

## 2019-04-07 DIAGNOSIS — R0981 Nasal congestion: Secondary | ICD-10-CM | POA: Diagnosis not present

## 2019-04-07 DIAGNOSIS — Z23 Encounter for immunization: Secondary | ICD-10-CM | POA: Diagnosis not present

## 2019-04-07 DIAGNOSIS — E039 Hypothyroidism, unspecified: Secondary | ICD-10-CM | POA: Diagnosis not present

## 2019-04-07 DIAGNOSIS — Z1211 Encounter for screening for malignant neoplasm of colon: Secondary | ICD-10-CM | POA: Diagnosis not present

## 2019-04-07 DIAGNOSIS — E785 Hyperlipidemia, unspecified: Secondary | ICD-10-CM | POA: Diagnosis not present

## 2019-04-07 DIAGNOSIS — J4520 Mild intermittent asthma, uncomplicated: Secondary | ICD-10-CM | POA: Diagnosis not present

## 2019-04-07 DIAGNOSIS — M170 Bilateral primary osteoarthritis of knee: Secondary | ICD-10-CM | POA: Diagnosis not present

## 2019-04-09 ENCOUNTER — Other Ambulatory Visit: Payer: Self-pay | Admitting: Internal Medicine

## 2019-04-10 ENCOUNTER — Other Ambulatory Visit: Payer: Self-pay | Admitting: Internal Medicine

## 2019-04-10 DIAGNOSIS — Z1231 Encounter for screening mammogram for malignant neoplasm of breast: Secondary | ICD-10-CM

## 2019-04-23 ENCOUNTER — Other Ambulatory Visit: Payer: Self-pay | Admitting: Neurology

## 2019-05-13 DIAGNOSIS — N200 Calculus of kidney: Secondary | ICD-10-CM | POA: Diagnosis not present

## 2019-05-22 ENCOUNTER — Other Ambulatory Visit: Payer: Self-pay

## 2019-05-26 ENCOUNTER — Other Ambulatory Visit: Payer: Self-pay

## 2019-05-26 ENCOUNTER — Ambulatory Visit (INDEPENDENT_AMBULATORY_CARE_PROVIDER_SITE_OTHER): Payer: Medicare Other | Admitting: Internal Medicine

## 2019-05-26 ENCOUNTER — Encounter: Payer: Self-pay | Admitting: Internal Medicine

## 2019-05-26 VITALS — BP 128/78 | HR 74 | Ht 61.0 in | Wt 329.0 lb

## 2019-05-26 DIAGNOSIS — E039 Hypothyroidism, unspecified: Secondary | ICD-10-CM

## 2019-05-26 DIAGNOSIS — R7303 Prediabetes: Secondary | ICD-10-CM

## 2019-05-26 LAB — POCT GLYCOSYLATED HEMOGLOBIN (HGB A1C): Hemoglobin A1C: 6 % — AB (ref 4.0–5.6)

## 2019-05-26 LAB — T4, FREE: Free T4: 1.11 ng/dL (ref 0.60–1.60)

## 2019-05-26 LAB — TSH: TSH: 3.08 u[IU]/mL (ref 0.35–4.50)

## 2019-05-26 MED ORDER — LEVOTHYROXINE SODIUM 150 MCG PO TABS: 150.0000 ug | ORAL_TABLET | Freq: Every day | ORAL | 3 refills | Status: DC

## 2019-05-26 NOTE — Patient Instructions (Addendum)
Please continue: - Metformin 500 mg 2x a day with meals.  Continue levothyroxine 150 mcg daily.  Take the thyroid hormone every day, with water, at least 30 minutes before breakfast, separated by at least 4 hours from: - acid reflux medications - calcium - iron - multivitamins  Please stop at the lab.  Please come back for a follow-up appointment in 6 months.

## 2019-05-26 NOTE — Progress Notes (Signed)
Patient ID: Jennifer Huffman, female   DOB: 1951-09-21, 68 y.o.   MRN: [id number]  HPI: Jennifer Huffman is a 68 y.o.-year-old female, returning for f/u for prediabetes, dx in ~2014, diet-controlled uncontrolled, without complications. She moved from [place] in 08/2015. Last visit 6 months ago.  Prediabetes:  Reviewed HbA1c levels: Lab Results  Component Value Date   HGBA1C 6.2 (A) 11/24/2018   HGBA1C 5.8 05/22/2018   HGBA1C 6.1 11/15/2017  05/22/2018: HbA1c 5.8% 10/31/2015: HbA1c 6.5%  Pt is on: - Metformin 500 mg 2x a day >> started 08/2017.  No GI side effects.  She is still not checking sugars at home despite multiple promptings... She has a FreeStyle Libre CGM >> irritated her skin >> she stopped using it.  Glucometer: [employer] Next  -No history of CKD, last BUN/creatinine:  04/07/2019: 16/1.03 Lab Results  Component Value Date   BUN 15 07/01/2018   Lab Results  Component Value Date   CREATININE 1.11 (H) 07/01/2018  05/08/2018: 16.4/1.10, ACR 1.0 07/03/2016: 11/0.83, glucose 98 10/31/2015: 16/0.87, Glucose 113 On telmisartan.  -+ HL; last set of lipids: 04/07/2019: 191/119/55/112 04/01/2018: 195/105/60/114 Lab Results  Component Value Date   CHOL 179 12/21/2016   HDL 51.60 12/21/2016   LDLCALC 102 (H) 12/21/2016   TRIG 128.0 12/21/2016   CHOLHDL 3 12/21/2016  10/31/2015: 175/82/50/104 On atorvastatin.  - last eye exam 10/2018: No DR, + cataract >> had cataract surgeries in 02 and 11/2018.  -No numbness and tingling in her feet.  On ASA 81.  She has a history of HTN, lymphedema, osteoarthritis of knees, mild intermittent asthma, eczema.   Hypothyroidism:  TFTs reviewed: 04/01/2018: TSH 3.80 Lab Results  Component Value Date   TSH 3.08 07/04/2017   TSH 1.27 12/21/2016  03/06/2016: TSH 4.74 10/31/2015: TSH 4.36 (0.34-4.5)  Pt is on levothyroxine 150 mcg daily, taken: - in am - fasting - at least 30 min from b'fast - no Ca, Fe, PPIs -  + MVI later in the day - not on Biotin  She has a kidney stone removed - 05/08/2018.   ROS: Constitutional: no weight gain/no weight loss, no fatigue, no subjective hyperthermia, no subjective hypothermia Eyes: no blurry vision, no xerophthalmia ENT: no sore throat, no nodules palpated in neck, no dysphagia, no odynophagia, no hoarseness Cardiovascular: no CP/no SOB/no palpitations/+ leg swelling Respiratory: no cough/no SOB/no wheezing Gastrointestinal: no N/no V/no D/no C/no acid reflux Musculoskeletal: no muscle aches/no joint aches Skin: no rashes, no hair loss Neurological: no tremors/no numbness/no tingling/no dizziness  I reviewed pt's medications, allergies, PMH, social hx, family hx, and changes were documented in the history of present illness. Otherwise, unchanged from my initial visit note.  Past Medical History:  Diagnosis Date  . Asthma, mild intermittent   . Blurred vision, bilateral   . Diabetes mellitus without complication (HCC)    type 2  . Dyspnea    with exertion  . History of kidney stones   . Hyperlipidemia   . Hypertension   . Hypothyroidism   . Lymphedema of both lower extremities    compressions hose when out anf wraps at home  . Neuromuscular disorder ([place])   . Osteoarthritis of both knees    Past Surgical History:  Procedure Laterality Date  . ABDOMINAL HYSTERECTOMY     partial  . COLONOSCOPY WITH PROPOFOL N/A 01/17/2017   Procedure: COLONOSCOPY WITH PROPOFOL;  Surgeon: Wilford Corner, MD;  Location: [facility];  Service: Endoscopy;  Laterality: N/A;  . CYSTOSCOPY/RETROGRADE/URETEROSCOPY/STONE EXTRACTION WITH  BASKET Right 06/30/2018   Procedure: CYSTOSCOPY/RETROGRADE/URETEROSCOPY/STONE EXTRACTION WITH BASKET INSERTION DOUBLE J STENT;  Surgeon: Bjorn PippinWrenn, John, MD;  Location: [facility];  Service: Urology;  Laterality: Right;  . EXTRACORPOREAL SHOCK WAVE LITHOTRIPSY Right 07/31/2018   Procedure: RIGHT EXTRACORPOREAL SHOCK WAVE LITHOTRIPSY (ESWL);   Surgeon: Heloise PurpuraBorden, Lester, MD;  Location: [facility];  Service: Urology;  Laterality: Right;  . HOLMIUM LASER APPLICATION Right 06/30/2018   Procedure: HOLMIUM LASER APPLICATION;  Surgeon: Bjorn PippinWrenn, John, MD;  Location: [facility];  Service: Urology;  Laterality: Right;  . TUBAL LIGATION    . URETEROSCOPY WITH HOLMIUM LASER LITHOTRIPSY Left 05/07/2018   Social History   Social History  . Marital Status: Legally Separated    Spouse Name: N/A  . Number of Children: 2   Occupational History  . retired   Social History Main Topics  . Smoking status: Never Smoker   . Smokeless tobacco: Never Used  . Alcohol Use: Vodka 1-2 drinks socially  . Drug Use: No   Current Outpatient Medications on File Prior to Visit  Medication Sig Dispense Refill  . albuterol (PROVENTIL HFA;VENTOLIN HFA) 108 (90 Base) MCG/ACT inhaler Inhale 2 puffs into the lungs every 4 (four) hours as needed for wheezing or shortness of breath.    Marland Kitchen. amLODipine (NORVASC) 5 MG tablet Take 5 mg by mouth daily.    Marland Kitchen. aspirin 81 MG tablet Take 81 mg by mouth daily.    Marland Kitchen. atorvastatin (LIPITOR) 80 MG tablet Take 1 tablet (80 mg total) by mouth daily. 90 tablet 3  . BAYER MICROLET LANCETS lancets Use as instructed used to check one time a day 100 each 5  . Fluticasone-Salmeterol (ADVAIR) 250-50 MCG/DOSE AEPB Inhale 1 puff into the lungs 2 (two) times daily.     . furosemide (LASIX) 40 MG tablet Take 40 mg by mouth daily.    Marland Kitchen. gabapentin (NEURONTIN) 300 MG capsule TAKE 1 CAPSULE BY MOUTH TWICE A DAY 60 capsule 0  . glucose blood (BAYER CONTOUR NEXT TEST) test strip Use as instructed testing one time a day. 100 each 5  . HYDROcodone-acetaminophen (NORCO) 5-325 MG tablet Take 1 tablet by mouth every 6 (six) hours as needed for moderate pain. 15 tablet 0  . levothyroxine (SYNTHROID) 150 MCG tablet TAKE 1 TABLET (150 MCG TOTAL) BY MOUTH DAILY BEFORE BREAKFAST. 90 tablet 1  . metFORMIN (GLUCOPHAGE) 500 MG tablet TAKE 1 TABLET BY MOUTH TWICE A DAY WITH A  MEAL 180 tablet 2  . Multiple Vitamins-Minerals (MULTIVITAMIN WITH MINERALS) tablet Take 1 tablet by mouth daily.    . potassium chloride (KLOR-CON 10) 10 MEQ tablet Take 20 mEq by mouth daily.     . tamsulosin (FLOMAX) 0.4 MG CAPS capsule Take 0.4 mg by mouth daily.    Marland Kitchen. telmisartan (MICARDIS) 80 MG tablet Take 80 mg by mouth daily.    . traMADol (ULTRAM) 50 MG tablet Take 1 tablet (50 mg total) by mouth every 6 (six) hours as needed. 30 tablet 0   No current facility-administered medications on file prior to visit.    No Known Allergies Family History  Problem Relation Age of Onset  . Cancer Mother        breast  . Breast cancer Mother [id number]  . Hypertension Father   . Breast cancer Sister [id number]  . Breast cancer Sister [id number]   PE: BP 128/78   Pulse 74   Ht 5\' 1"  (1.549 m)   Wt (!) 329 lb (149.2 kg)   SpO2  96%   BMI 62.16 kg/m  Wt Readings from Last 3 Encounters:  05/26/19 (!) 329 lb (149.2 kg)  11/24/18 (!) 321 lb (145.6 kg)  06/30/18 (!) 302 lb (137 kg)   Constitutional: overweight, in NAD, walks with a walker Eyes: PERRLA, EOMI, no exophthalmos ENT: moist mucous membranes, no thyromegaly, no cervical lymphadenopathy Cardiovascular: RRR, No MRG, + bilateral significant lymphedema-legs both edematous and wrapped, with right leg larger than the left Respiratory: CTA B Gastrointestinal: abdomen soft, NT, ND, BS+ Musculoskeletal: no deformities, strength intact in all 4 Skin: moist, warm, no rashes Neurological: no tremor with outstretched hands, DTR normal in all 4  ASSESSMENT: 1. Prediabetes  2. Hypothyroidism  3.  HL  PLAN:  1. Patient with history of prediabetes, with latest HbA1c from last visit my at 6.2%.  She continues on metformin low dose twice a day which appears to be working for her.  She is tolerating this well, without GI side effects.  I repeatedly advised her to check sugars at home but she is still not doing so.  At last visit I advised her to check sugars at  least in the 2-week period before our visit. -At this visit, she tells me that she did not check sugars at home.  I again advised her to start - 1x a day or at least every other day, rotating check times - we checked her HbA1c: 6.0% (better) -We will continue only metformin for now - I suggested to:  Patient Instructions  Please continue: - Metformin 500 mg 2x a day with meals.  Continue levothyroxine 150 mcg daily.  Take the thyroid hormone every day, with water, at least 30 minutes before breakfast, separated by at least 4 hours from: - acid reflux medications - calcium - iron - multivitamins  Please come back for a follow-up appointment in 6 months.  - advised for yearly eye exams >> she is UTD - return to clinic in 6 months     2. Hypothyroidism - latest thyroid labs reviewed with pt >> normal 03/2018 - she continues on LT4 150 mcg daily - pt feels good on this dose. - we discussed about taking the thyroid hormone every day, with water, >30 minutes before breakfast, separated by >4 hours from acid reflux medications, calcium, iron, multivitamins. Pt. is taking it correctly. - will check thyroid tests today: TSH and fT4 - If labs are abnormal, she will need to return for repeat TFTs in 1.5 months  3.  HL - Reviewed latest lipid panel from  04/2019: LDL was above target - compliance? - Continues Atorvastatin without side effects.   Needs refills LT4.  Office Visit on 05/26/2019  Component Date Value Ref Range Status  . TSH 05/26/2019 3.08  0.35 - 4.50 uIU/mL Final  . Free T4 05/26/2019 1.11  0.60 - 1.60 ng/dL Final   Comment: Specimens from patients who are undergoing biotin therapy and /or ingesting biotin supplements may contain high levels of biotin.  The higher biotin concentration in these specimens interferes with this Free T4 assay.  Specimens that contain high levels  of biotin may cause false high results for this Free T4 assay.  Please interpret results in light  of the total clinical presentation of the patient.    . Hemoglobin A1C 05/26/2019 6.0* 4.0 - 5.6 % Final   Thyroid tests are normal.  Carlus Pavlov, MD PhD [facility]

## 2019-05-28 ENCOUNTER — Ambulatory Visit: Payer: Medicare Other

## 2019-05-31 ENCOUNTER — Other Ambulatory Visit: Payer: Self-pay | Admitting: Neurology

## 2019-06-01 ENCOUNTER — Other Ambulatory Visit: Payer: Self-pay

## 2019-06-01 NOTE — Telephone Encounter (Signed)
Patient needs to schedule a follow-up for additional refills, as it has been > 1 year since her last visit, which was on 12/06/2017.   She is welcome to request gabapentin through her PCP, if she has regular visits with them and has seen them more recently.

## 2019-06-01 NOTE — Telephone Encounter (Signed)
Left message for her to make an appt or get future refills from Her PCP.

## 2019-06-02 DIAGNOSIS — M1711 Unilateral primary osteoarthritis, right knee: Secondary | ICD-10-CM | POA: Diagnosis not present

## 2019-06-02 DIAGNOSIS — M1712 Unilateral primary osteoarthritis, left knee: Secondary | ICD-10-CM | POA: Diagnosis not present

## 2019-06-02 DIAGNOSIS — Z6841 Body Mass Index (BMI) 40.0 and over, adult: Secondary | ICD-10-CM | POA: Diagnosis not present

## 2019-06-16 ENCOUNTER — Other Ambulatory Visit: Payer: Self-pay

## 2019-06-16 ENCOUNTER — Ambulatory Visit
Admission: RE | Admit: 2019-06-16 | Discharge: 2019-06-16 | Disposition: A | Discharge status: Home / Self Care | Payer: Medicare Other | Source: Ambulatory Visit | Attending: Internal Medicine | Admitting: Internal Medicine

## 2019-06-16 DIAGNOSIS — Z1231 Encounter for screening mammogram for malignant neoplasm of breast: Secondary | ICD-10-CM | POA: Diagnosis not present

## 2019-06-25 DIAGNOSIS — K219 Gastro-esophageal reflux disease without esophagitis: Secondary | ICD-10-CM | POA: Diagnosis not present

## 2019-08-25 DIAGNOSIS — K219 Gastro-esophageal reflux disease without esophagitis: Secondary | ICD-10-CM | POA: Diagnosis not present

## 2019-08-26 IMAGING — MG DIGITAL SCREENING BILATERAL MAMMOGRAM WITH TOMO AND CAD
8 of 14 series · 8 of 40 positions shown · non-contrast
Comparison: None.

CLINICAL DATA: Screening.

EXAM:
DIGITAL SCREENING BILATERAL MAMMOGRAM WITH TOMO AND CAD

[L MLO synth-2D]
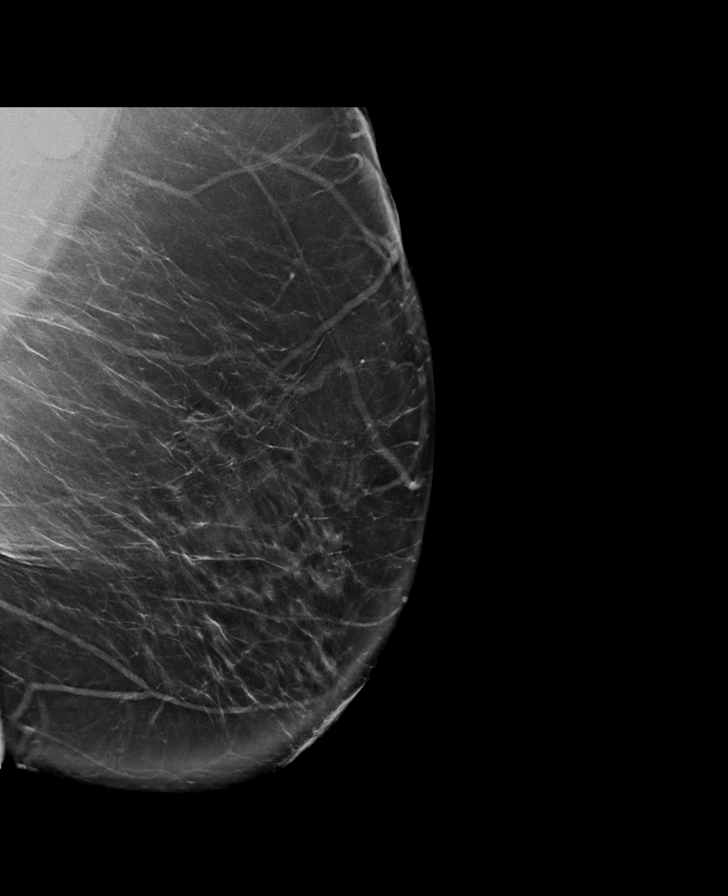

[R CC synth-2D (1 of 2)]
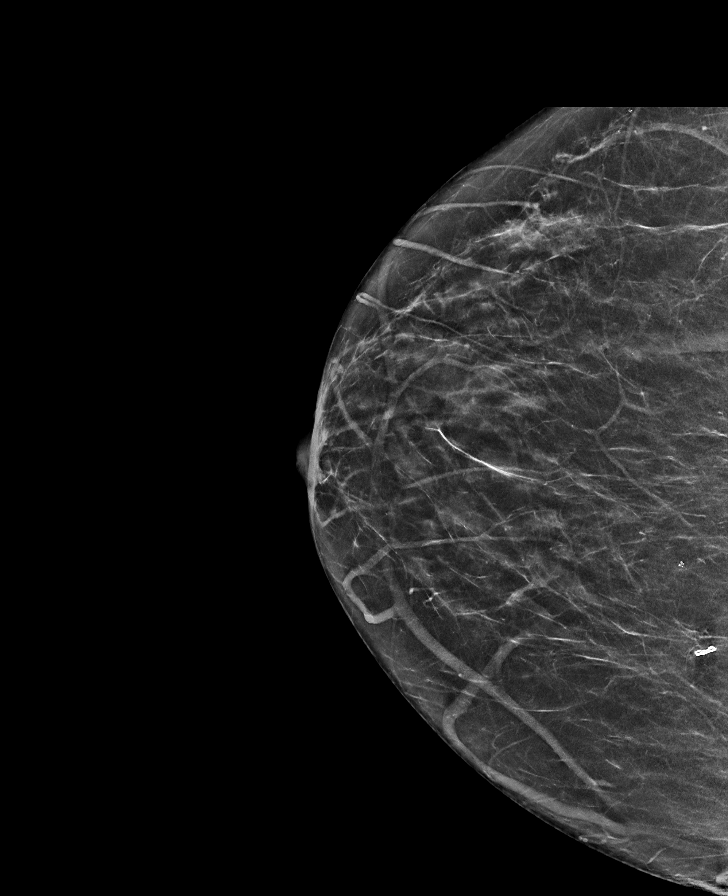

[L CC synth-2D (1 of 2)]
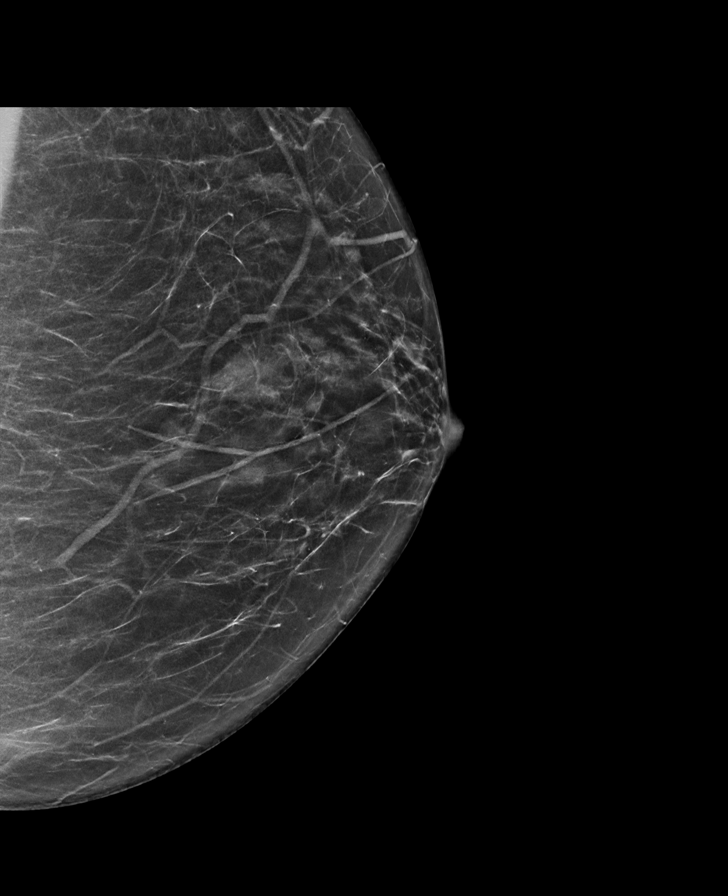

[R CC synth-2D (2 of 2)]
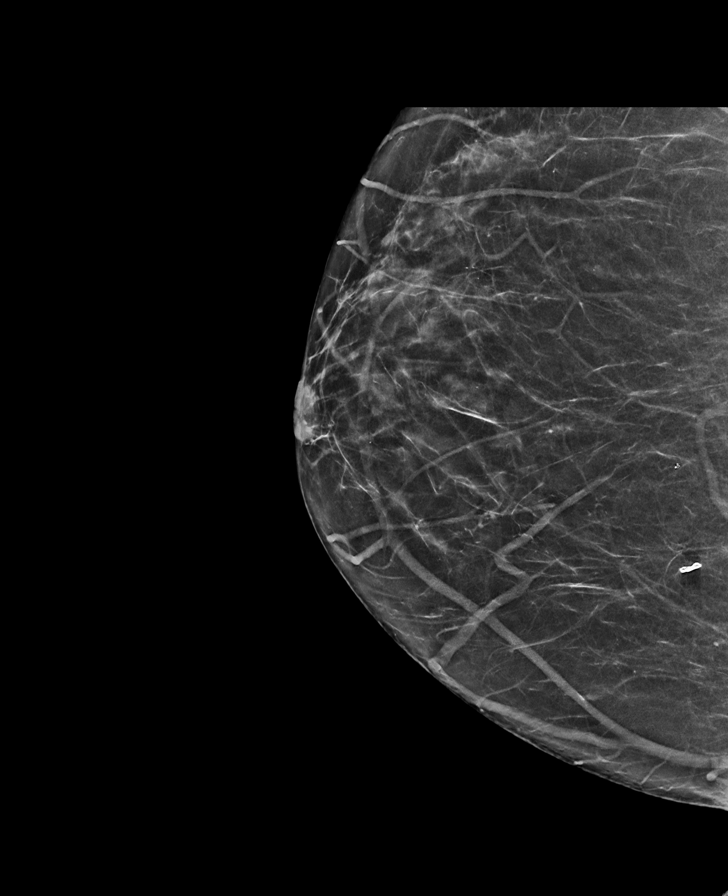

[R MLO synth-2D (1 of 2)]
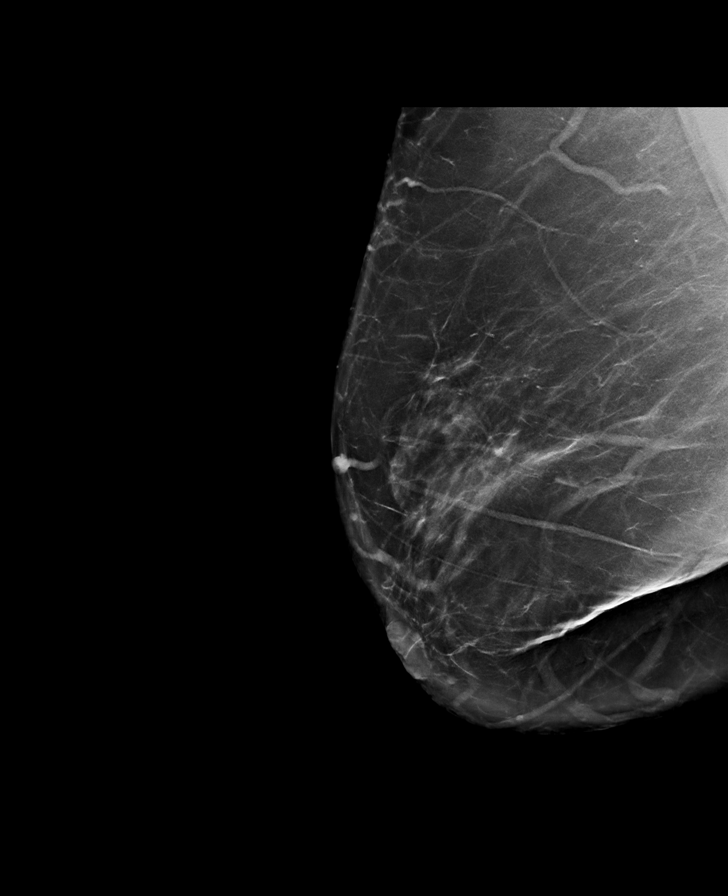

[L CC synth-2D (2 of 2)]
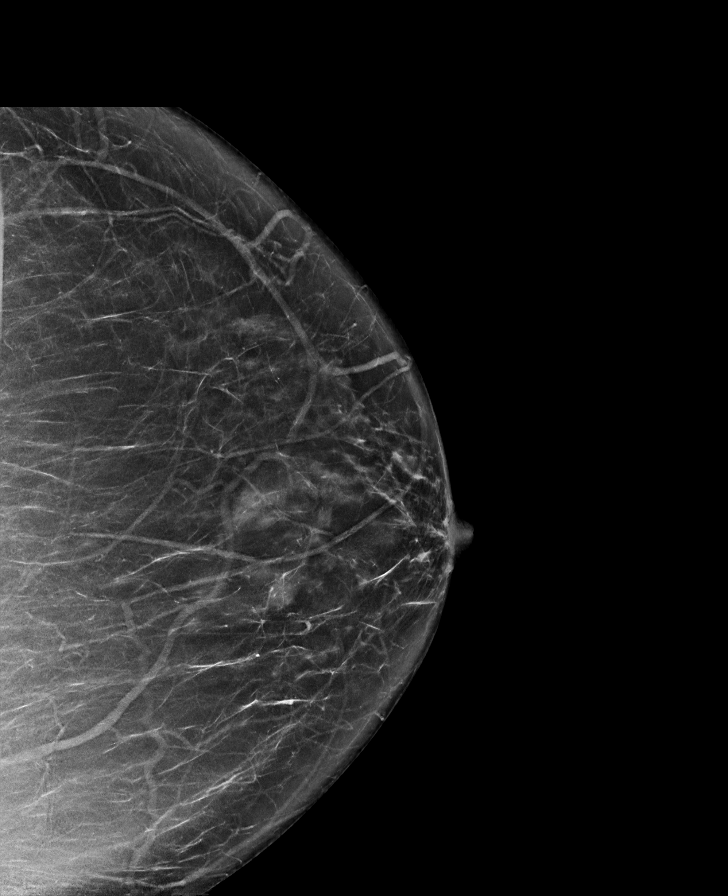

[R MLO synth-2D (2 of 2)]
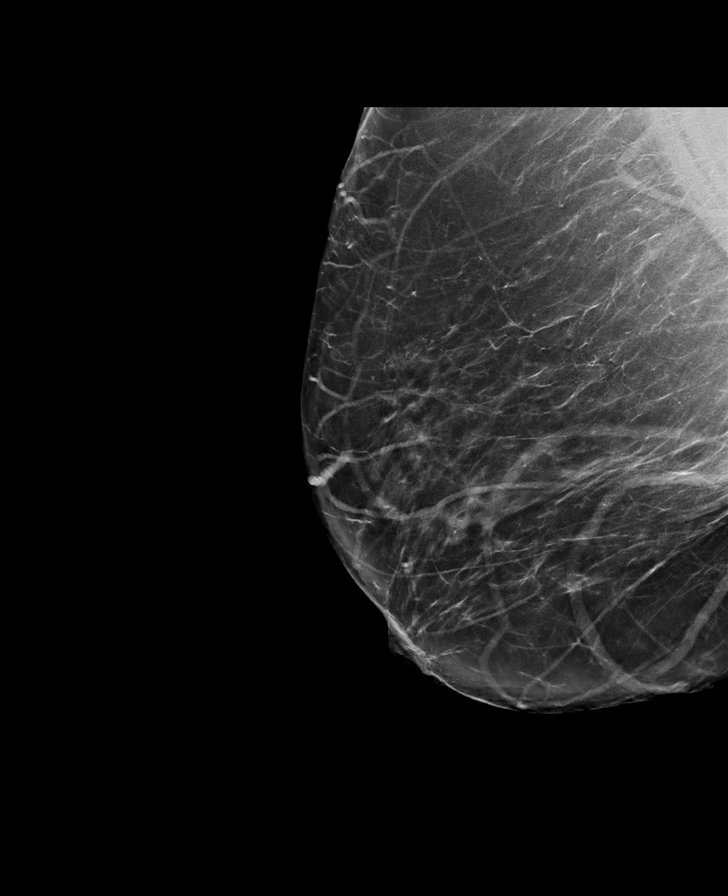

[L MLO tomo · tomo slice 49/96.0]
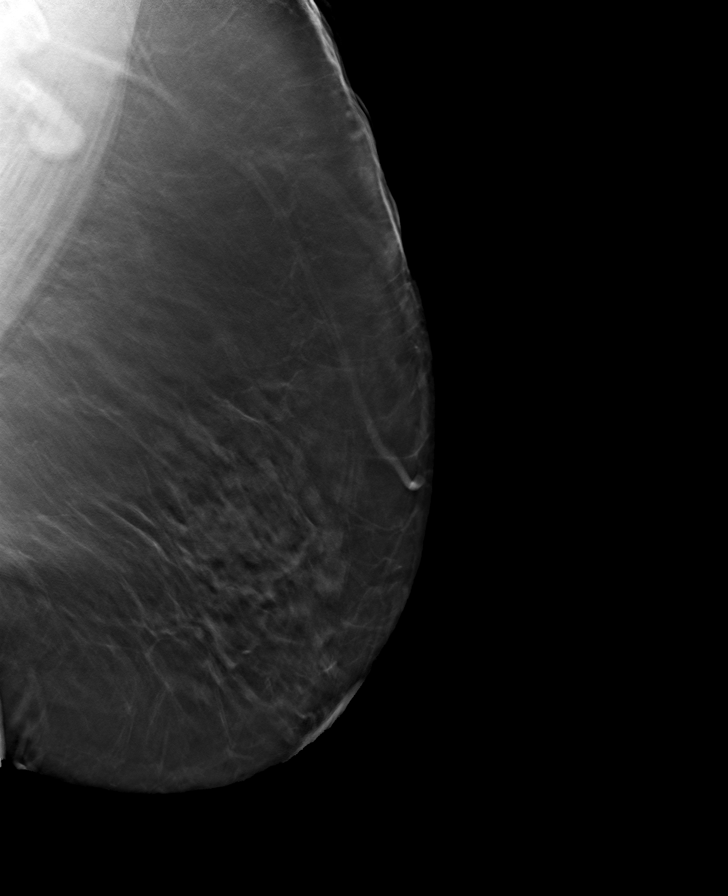

[8 of 40 positions shown; findings below may reference images not displayed]

ACR Breast Density Category b: There are scattered areas of
fibroglandular density.
FINDINGS: There are no findings suspicious for malignancy. Images were
processed with CAD.
IMPRESSION: No mammographic evidence of malignancy. A result letter of this
screening mammogram will be mailed directly to the patient.

RECOMMENDATION:
Screening mammogram in one year. (Code:Y5-G-EJ6)

BI-RADS CATEGORY  1: Negative.

## 2019-09-08 DIAGNOSIS — G5601 Carpal tunnel syndrome, right upper limb: Secondary | ICD-10-CM | POA: Diagnosis not present

## 2019-09-08 DIAGNOSIS — M1711 Unilateral primary osteoarthritis, right knee: Secondary | ICD-10-CM | POA: Diagnosis not present

## 2019-09-08 DIAGNOSIS — M1712 Unilateral primary osteoarthritis, left knee: Secondary | ICD-10-CM | POA: Diagnosis not present

## 2019-10-12 ENCOUNTER — Other Ambulatory Visit: Payer: Self-pay | Admitting: Internal Medicine

## 2019-11-24 ENCOUNTER — Ambulatory Visit (INDEPENDENT_AMBULATORY_CARE_PROVIDER_SITE_OTHER): Payer: Medicare Other | Admitting: Internal Medicine

## 2019-11-24 ENCOUNTER — Other Ambulatory Visit: Payer: Self-pay

## 2019-11-24 ENCOUNTER — Encounter: Payer: Self-pay | Admitting: Internal Medicine

## 2019-11-24 VITALS — BP 130/90 | HR 75 | Ht 61.0 in | Wt 322.0 lb

## 2019-11-24 DIAGNOSIS — E039 Hypothyroidism, unspecified: Secondary | ICD-10-CM

## 2019-11-24 DIAGNOSIS — E785 Hyperlipidemia, unspecified: Secondary | ICD-10-CM | POA: Diagnosis not present

## 2019-11-24 DIAGNOSIS — R7303 Prediabetes: Secondary | ICD-10-CM

## 2019-11-24 LAB — POCT GLYCOSYLATED HEMOGLOBIN (HGB A1C): Hemoglobin A1C: 6.2 % — AB (ref 4.0–5.6)

## 2019-11-24 NOTE — Progress Notes (Signed)
Patient ID: Jennifer Huffman, female   DOB: [birth year], 69 y.o.   MRN: [id number]  This visit occurred during the SARS-CoV-2 public health emergency.  Safety protocols were in place, including screening questions prior to the visit, additional usage of staff PPE, and extensive cleaning of exam room while observing appropriate contact time as indicated for disinfecting solutions.   HPI: Jennifer Huffman is a 69 y.o.-year-old female, returning for f/u for prediabetes, dx in ~2014, controlled uncontrolled, without complications. She moved from [place] in 08/2015. Last visit 6 months ago.  Prediabetes:  Reviewed HbA1c levels: Lab Results  Component Value Date   HGBA1C 6.0 (A) 05/26/2019   HGBA1C 6.2 (A) 11/24/2018   HGBA1C 5.8 05/22/2018  05/22/2018: HbA1c 5.8% 10/31/2015: HbA1c 6.5%  Pt is on: - Metformin 500 mg 2x a day started 08/2017.  No GI side effects.  She is still not checking sugars at home despite multiple promptings... She has a Free[employer] Libre CGM >> irritated her skin >> she stopped using it.  Glucometer: [employer] Next  -No history of CKD, last BUN/creatinine:  04/07/2019: 16/1.03 Lab Results  Component Value Date   BUN 15 07/01/2018   Lab Results  Component Value Date   CREATININE 1.11 (H) 07/01/2018  05/08/2018: 16.4/1.10, ACR 1.0 07/03/2016: 11/0.83, glucose 98 10/31/2015: 16/0.87, Glucose 113 On telmisartan.  -+ HL; last set of lipids: 04/07/2019: 191/119/55/112 04/01/2018: 195/105/60/114 Lab Results  Component Value Date   CHOL 179 12/21/2016   HDL 51.60 12/21/2016   LDLCALC 102 (H) 12/21/2016   TRIG 128.0 12/21/2016   CHOLHDL 3 12/21/2016  10/31/2015: 175/82/50/104 On atorvastatin.  - she had an eye exam 10/2018: No DR, + cataracts but had cataract surgeries in 02 and 11/2018. Last eye exam: 09/2019. No DR reportedly.   -She denies numbness and tingling in her feet.  On ASA 81.  She has a history of HTN, lymphedema, OA of her knees,  mild intermittent asthma, eczema.   Hypothyroidism:  TFTs reviewed: Lab Results  Component Value Date   TSH 3.08 05/26/2019   TSH 3.08 07/04/2017   TSH 1.27 12/21/2016  04/01/2018: TSH 3.80 03/06/2016: TSH 4.74 10/31/2015: TSH 4.36 (0.34-4.5)  Pt is on levothyroxine 150 mcg daily, taken: - in am - fasting - at least 30 min from b'fast - no Ca, Fe, PPIs - + Multivitamins later in the day - not on Biotin  She has a kidney stone removed - 05/08/2018.   ROS: Constitutional: no weight gain/no weight loss, no fatigue, no subjective hyperthermia, no subjective hypothermia Eyes: no blurry vision, no xerophthalmia ENT: no sore throat, no nodules palpated in neck, no dysphagia, no odynophagia, no hoarseness Cardiovascular: no CP/no SOB/no palpitations/no leg swelling Respiratory: no cough/no SOB/no wheezing Gastrointestinal: no N/no V/no D/no C/no acid reflux Musculoskeletal: no muscle aches/+ joint aches (knees) Skin: no rashes, no hair loss Neurological: no tremors/no numbness/no tingling/no dizziness  I reviewed pt's medications, allergies, PMH, social hx, family hx, and changes were documented in the history of present illness. Otherwise, unchanged from my initial visit note.  Past Medical History:  Diagnosis Date  . Asthma, mild intermittent   . Blurred vision, bilateral   . Diabetes mellitus without complication (HCC)    type 2  . Dyspnea    with exertion  . History of kidney stones   . Hyperlipidemia   . Hypertension   . Hypothyroidism   . Lymphedema of both lower extremities    compressions hose when out anf wraps at home  .  Neuromuscular disorder ([place])   . Osteoarthritis of both knees    Past Surgical History:  Procedure Laterality Date  . ABDOMINAL HYSTERECTOMY     partial  . COLONOSCOPY WITH PROPOFOL N/A 01/17/2017   Procedure: COLONOSCOPY WITH PROPOFOL;  Surgeon: Wilford Corner, MD;  Location: [facility];  Service: Endoscopy;  Laterality: N/A;  .  CYSTOSCOPY/RETROGRADE/URETEROSCOPY/STONE EXTRACTION WITH BASKET Right 06/30/2018   Procedure: CYSTOSCOPY/RETROGRADE/URETEROSCOPY/STONE EXTRACTION WITH BASKET INSERTION DOUBLE J STENT;  Surgeon: Irine Seal, MD;  Location: [facility];  Service: Urology;  Laterality: Right;  . EXTRACORPOREAL SHOCK WAVE LITHOTRIPSY Right 07/31/2018   Procedure: RIGHT EXTRACORPOREAL SHOCK WAVE LITHOTRIPSY (ESWL);  Surgeon: Raynelle Bring, MD;  Location: [facility];  Service: Urology;  Laterality: Right;  . HOLMIUM LASER APPLICATION Right 2/45/8099   Procedure: HOLMIUM LASER APPLICATION;  Surgeon: Irine Seal, MD;  Location: [facility];  Service: Urology;  Laterality: Right;  . TUBAL LIGATION    . URETEROSCOPY WITH HOLMIUM LASER LITHOTRIPSY Left 05/07/2018   Social History   Social History  . Marital Status: Legally Separated    Spouse Name: N/A  . Number of Children: 2   Occupational History  . retired   Social History Main Topics  . Smoking status: Never Smoker   . Smokeless tobacco: Never Used  . Alcohol Use: Vodka 1-2 drinks socially  . Drug Use: No   Current Outpatient Medications on File Prior to Visit  Medication Sig Dispense Refill  . albuterol (PROVENTIL HFA;VENTOLIN HFA) 108 (90 Base) MCG/ACT inhaler Inhale 2 puffs into the lungs every 4 (four) hours as needed for wheezing or shortness of breath.    Marland Kitchen amLODipine (NORVASC) 5 MG tablet Take 5 mg by mouth daily.    Marland Kitchen aspirin 81 MG tablet Take 81 mg by mouth daily.    Marland Kitchen atorvastatin (LIPITOR) 80 MG tablet Take 1 tablet (80 mg total) by mouth daily. 90 tablet 3  . BAYER MICROLET LANCETS lancets Use as instructed used to check one time a day 100 each 5  . Fluticasone-Salmeterol (ADVAIR) 250-50 MCG/DOSE AEPB Inhale 1 puff into the lungs 2 (two) times daily.     . furosemide (LASIX) 40 MG tablet Take 40 mg by mouth daily.    Marland Kitchen gabapentin (NEURONTIN) 300 MG capsule TAKE 1 CAPSULE BY MOUTH TWICE A DAY 60 capsule 0  . glucose blood (BAYER CONTOUR NEXT TEST) test  strip Use as instructed testing one time a day. 100 each 5  . HYDROcodone-acetaminophen (NORCO) 5-325 MG tablet Take 1 tablet by mouth every 6 (six) hours as needed for moderate pain. 15 tablet 0  . levothyroxine (SYNTHROID) 150 MCG tablet Take 1 tablet (150 mcg total) by mouth daily before breakfast. 90 tablet 3  . metFORMIN (GLUCOPHAGE) 500 MG tablet TAKE 1 TABLET BY MOUTH TWICE A DAY WITH MEALS 180 tablet 2  . Multiple Vitamins-Minerals (MULTIVITAMIN WITH MINERALS) tablet Take 1 tablet by mouth daily.    . potassium chloride (KLOR-CON 10) 10 MEQ tablet Take 20 mEq by mouth daily.     . tamsulosin (FLOMAX) 0.4 MG CAPS capsule Take 0.4 mg by mouth daily.    Marland Kitchen telmisartan (MICARDIS) 80 MG tablet Take 80 mg by mouth daily.    . traMADol (ULTRAM) 50 MG tablet Take 1 tablet (50 mg total) by mouth every 6 (six) hours as needed. 30 tablet 0   No current facility-administered medications on file prior to visit.   No Known Allergies Family History  Problem Relation Age of Onset  .  Cancer Mother        breast  . Breast cancer Mother 42  . Hypertension Father   . Breast cancer Sister 55  . Breast cancer Sister 20   PE: BP 130/90   Pulse 75   Ht 5\' 1"  (1.549 m)   Wt (!) 322 lb (146.1 kg)   SpO2 95%   BMI 60.84 kg/m  Wt Readings from Last 3 Encounters:  11/24/19 (!) 322 lb (146.1 kg)  05/26/19 (!) 329 lb (149.2 kg)  11/24/18 (!) 321 lb (145.6 kg)   Constitutional: overweight, in NAD, walks with a walker Eyes: PERRLA, EOMI, no exophthalmos ENT: moist mucous membranes, no thyromegaly, no cervical lymphadenopathy Cardiovascular: RRR, No MRG, + bilateral significant lymphedema with right leg larger than the left vaginosis Respiratory: CTA B Gastrointestinal: abdomen soft, NT, ND, BS+ Musculoskeletal: no deformities, strength intact in all 4 Skin: moist, warm, no rashes Neurological: no tremor with outstretched hands, DTR normal in all 4  ASSESSMENT: 1. Prediabetes  2.  Hypothyroidism  3.  HL  PLAN:  1. Patient with history of prediabetes.  She continues on  low-dose Metformin twice a day which appears to be working well for her.  She is tolerating this well, without GI side effects. -At every visit I advised her to check sugars at home, but she continues not to check them at all.  I again advised her about the importance of checking blood sugars once a day or at least every other day. -We will continue Metformin for now - I suggested to:  Patient Instructions  Please continue: - Metformin 500 mg 2x a day with meals.  Try to check sugars every day or every other day.  Continue levothyroxine 150 mcg daily.  Take the thyroid hormone every day, with water, at least 30 minutes before breakfast, separated by at least 4 hours from: - acid reflux medications - calcium - iron - multivitamins  Please come back for a follow-up appointment in 6 months.  - we checked her HbA1c: 6.2% (slightly higher) - advised for yearly eye exams >> she is UTD - return to clinic in 6 months    2. Hypothyroidism - latest thyroid labs reviewed with pt >> normal at last visit: Lab Results  Component Value Date   TSH 3.08 05/26/2019   - she continues on LT4 150 mcg daily - pt feels good on this dose. - we discussed about taking the thyroid hormone every day, with water, >30 minutes before breakfast, separated by >4 hours from acid reflux medications, calcium, iron, multivitamins. Pt. is taking it correctly. - We will recheck her TFTs at next visit  3.  HL - Reviewed latest lipid panel from 04/2019: LDL was slightly above target (? Compliance) - Continues atorvastatin without side effects.  05/2019, MD PhD [facility] Endocrinology

## 2019-11-24 NOTE — Addendum Note (Signed)
Addended by: Darliss Ridgel I on: 11/24/2019 01:18 PM   Modules accepted: Orders

## 2019-11-24 NOTE — Patient Instructions (Addendum)
Please continue: - Metformin 500 mg 2x a day with meals.  Try to check sugars every day or every other day.  Continue levothyroxine 150 mcg daily.  Take the thyroid hormone every day, with water, at least 30 minutes before breakfast, separated by at least 4 hours from: - acid reflux medications - calcium - iron - multivitamins  Please come back for a follow-up appointment in 6 months.

## 2020-01-21 ENCOUNTER — Other Ambulatory Visit: Payer: Self-pay

## 2020-01-21 ENCOUNTER — Other Ambulatory Visit: Payer: Self-pay | Admitting: Orthopedic Surgery

## 2020-01-21 ENCOUNTER — Encounter: Payer: Self-pay | Admitting: *Deleted

## 2020-01-21 ENCOUNTER — Ambulatory Visit
Admission: RE | Admit: 2020-01-21 | Discharge: 2020-01-21 | Disposition: A | Discharge status: Home / Self Care | Payer: Medicare Other | Source: Ambulatory Visit | Attending: Orthopedic Surgery | Admitting: Orthopedic Surgery

## 2020-01-21 DIAGNOSIS — M25562 Pain in left knee: Secondary | ICD-10-CM

## 2020-01-21 DIAGNOSIS — M25561 Pain in right knee: Secondary | ICD-10-CM

## 2020-01-21 HISTORY — PX: IR RADIOLOGIST EVAL & MGMT: IMG5224

## 2020-01-21 NOTE — Consult Note (Signed)
Chief Complaint: Knee pain   Referring Physician(s): Graves,Zayne Draheim  History of Present Illness: Jennifer Huffman is a 69 y.o. female with past medical history significant for type 2 diabetes, hypertension, hyperlipidemia, hypothyroidism and lymphedema affecting the bilateral lower extremities who has been referred to interventional radiology for potential IOVERA treatment of bilateral knee pain.  Patient states that she has a long history of bilateral knee pain, left greater than right, for the past at least 5 to 6 years.  She denies recent injury or trauma and states that she is not a candidate for knee replacement given her lower extremity lymphedema.  Patient states that her bilateral knee pain is approximately 9/10 at rest and worsens to 10/10 with activity.  Patient ambulates with both a cane and a walker.  Patient has undergone multiple previous bilateral steroid knee injections however this has only improved her resting knee pain to 8/10.    Past Medical History:  Diagnosis Date  . Asthma, mild intermittent   . Blurred vision, bilateral   . Diabetes mellitus without complication (HCC)    type 2  . Dyspnea    with exertion  . History of kidney stones   . Hyperlipidemia   . Hypertension   . Hypothyroidism   . Lymphedema of both lower extremities    compressions hose when out anf wraps at home  . Neuromuscular disorder (HCC)   . Osteoarthritis of both knees     Past Surgical History:  Procedure Laterality Date  . ABDOMINAL HYSTERECTOMY     partial  . COLONOSCOPY WITH PROPOFOL N/A 01/17/2017   Procedure: COLONOSCOPY WITH PROPOFOL;  Surgeon: Charlott Rakes, MD;  [facility];  Service: Endoscopy;  Laterality: N/A;  . CYSTOSCOPY/RETROGRADE/URETEROSCOPY/STONE EXTRACTION WITH BASKET Right 06/30/2018   Procedure: CYSTOSCOPY/RETROGRADE/URETEROSCOPY/STONE EXTRACTION WITH BASKET INSERTION DOUBLE J STENT;  Surgeon: Bjorn Pippin, MD;  [facility];  Service:  Urology;  Laterality: Right;  . EXTRACORPOREAL SHOCK WAVE LITHOTRIPSY Right 07/31/2018   Procedure: RIGHT EXTRACORPOREAL SHOCK WAVE LITHOTRIPSY (ESWL);  Surgeon: Heloise Purpura, MD;  Location[facility];  Service: Urology;  Laterality: Right;  . HOLMIUM LASER APPLICATION Right 06/30/2018   Procedure: HOLMIUM LASER APPLICATION;  Surgeon: Bjorn Pippin, MD;  Location: [facility];  Service: Urology;  Laterality: Right;  . TUBAL LIGATION    . URETEROSCOPY WITH HOLMIUM LASER LITHOTRIPSY Left 05/07/2018    Allergies: Patient has no known allergies.  Medications: Prior to Admission medications   Medication Sig Start Date End Date Taking? Authorizing Provider  albuterol (PROVENTIL HFA;VENTOLIN HFA) 108 (90 Base) MCG/ACT inhaler Inhale 2 puffs into the lungs every 4 (four) hours as needed for wheezing or shortness of breath.    [provider]  amLODipine (NORVASC) 5 MG tablet Take 5 mg by mouth daily.    [provider]  aspirin 81 MG tablet Take 81 mg by mouth daily.    [provider]  atorvastatin (LIPITOR) 80 MG tablet Take 1 tablet (80 mg total) by mouth daily. 11/24/18   Carlus Pavlov, MD  BAYER MICROLET LANCETS lancets Use as instructed used to check one time a day 05/22/16   Carlus Pavlov, MD  Fluticasone-Salmeterol (ADVAIR) 250-50 MCG/DOSE AEPB Inhale 1 puff into the lungs 2 (two) times daily.     [provider]  furosemide (LASIX) 40 MG tablet Take 40 mg by mouth daily.    [provider]  gabapentin (NEURONTIN) 300 MG capsule TAKE 1 CAPSULE BY MOUTH TWICE A DAY 04/23/19   Allena Katz,  Donika K, DO  glucose blood (BAYER CONTOUR NEXT TEST) test strip Use as instructed testing one time a day. 05/22/16   Philemon Kingdom, MD  HYDROcodone-acetaminophen (NORCO) 5-325 MG tablet Take 1 tablet by mouth every 6 (six) hours as needed for moderate pain. 07/01/18   Irine Seal, MD  levothyroxine (SYNTHROID) 150 MCG tablet Take 1 tablet (150 mcg total) by mouth daily  before breakfast. 05/26/19   Philemon Kingdom, MD  metFORMIN (GLUCOPHAGE) 500 MG tablet TAKE 1 TABLET BY MOUTH TWICE A DAY WITH MEALS 10/12/19   Philemon Kingdom, MD  Multiple Vitamins-Minerals (MULTIVITAMIN WITH MINERALS) tablet Take 1 tablet by mouth daily.    [provider]  potassium chloride (KLOR-CON 10) 10 MEQ tablet Take 20 mEq by mouth daily.     [provider]  tamsulosin (FLOMAX) 0.4 MG CAPS capsule Take 0.4 mg by mouth daily. 05/09/18   [provider]  telmisartan (MICARDIS) 80 MG tablet Take 80 mg by mouth daily.    [provider]  traMADol (ULTRAM) 50 MG tablet Take 1 tablet (50 mg total) by mouth every 6 (six) hours as needed. 04/27/16   Melynda Ripple, MD     Family History  Problem Relation Age of Onset  . Cancer Mother        breast  . Breast cancer Mother 65  . Hypertension Father   . Breast cancer Sister 42  . Breast cancer Sister 72    Social History   Socioeconomic History  . Marital status: Legally Separated    Spouse name: Not on file  . Number of children: 3  . Years of education: 30  . Highest education level: Not on file  Occupational History  . Occupation: retired  Tobacco Use  . Smoking status: Never Smoker  . Smokeless tobacco: Never Used  Substance and Sexual Activity  . Alcohol use: No    Alcohol/week: 0.0 standard drinks  . Drug use: No  . Sexual activity: Not on file  Other Topics Concern  . Not on file  Social History Narrative   Lives with her sister and a friend in a one story home.  Has 2 children.  Retired school bus attendant.  Education: 12.   Social Determinants of Health   Financial Resource Strain:   . Difficulty of Paying Living Expenses:   Food Insecurity:   . Worried About Charity fundraiser in the Last Year:   . Arboriculturist in the Last Year:   Transportation Needs:   . Film/video editor (Medical):   Marland Kitchen Lack of Transportation (Non-Medical):   Physical Activity:   . Days  of Exercise per Week:   . Minutes of Exercise per Session:   Stress:   . Feeling of Stress :   Social Connections:   . Frequency of Communication with Friends and Family:   . Frequency of Social Gatherings with Friends and Family:   . Attends Religious Services:   . Active Member of Clubs or Organizations:   . Attends Archivist Meetings:   Marland Kitchen Marital Status:     ECOG Status: 2 - Symptomatic, <50% confined to bed  Review of Systems  Review of Systems: A 12 point ROS discussed and pertinent positives are indicated in the HPI above.  All other systems are negative.  Physical Exam No direct physical exam was performed (except for noted visual exam findings with Video Visits).   Vital Signs: There were no vitals taken for this  visit.  Imaging: No results found.  Labs:  CBC: No results for input(s): WBC, HGB, HCT, PLT in the last 8760 hours.  COAGS: No results for input(s): INR, APTT in the last 8760 hours.  BMP: No results for input(s): NA, K, CL, CO2, GLUCOSE, BUN, CALCIUM, CREATININE, GFRNONAA, GFRAA in the last 8760 hours.  Invalid input(s): CMP  LIVER FUNCTION TESTS: No results for input(s): BILITOT, AST, ALT, ALKPHOS, PROT, ALBUMIN in the last 8760 hours.  TUMOR MARKERS: No results for input(s): AFPTM, CEA, CA199, CHROMGRNA in the last 8760 hours.  Assessment and Plan:  Jennifer Huffman is a 69 y.o. female with past medical history significant for type 2 diabetes, hypertension, hyperlipidemia, hypothyroidism and lymphedema affecting the bilateral lower extremities who has been referred to interventional radiology for potential IOVERA treatment of bilateral knee pain.  Patient states that she has a long history of bilateral knee pain, left greater than right, for the past at least 5 to 6 years.  She denies recent injury or trauma and states that she is not a candidate for knee replacement given her lower extremity lymphedema.   I explained that the  IOVERA treatment is a new modality that utilizes cyroneurolysis technology to temporarily alter the pain recepting nerves supplying the anterior aspect of the knee, in hopes of achieving a clinically significant reduction in knee pain.     I explained that if successful, the patient will experience a rapid pain reduction which can last for approximately 3 months.  I also explained that while their knee pain may be reduced, the structural damage of the knee remains, and thus, this is not a curative technology and their knee pain will return.     Risks associated with the procedure include bleeding/bruising, infection and post procedural paresthesias/weakness.   I explained that the procedure is performed as an outpatient basis at [facility].  The procedure is performed with local anesthesia and therefore the patient does need to be NPO, there is no postprocedural recovery and the patient does not need a driver home.  Additionally, the patient does not have to hold any other their medications, including anticoagulation.  On the day of the procedure, the patient is encouraged to wear either shorts or loose fitting pants that can be rolled up to the upper thigh.     Following this prolonged and detailed conversation, the patient wishes to pursue IOVERA therapy of the LEFT knee.    As such, pending insurance approval, this procedure would be scheduled at [facility] long hospital at the next earliest convenience (next treatment session likely May 7th).  The patient knows to call the interventional radiology clinic with any interval questions or concerns   Thank you for this interesting consult.  I greatly enjoyed meeting Jennifer Huffman and look forward to participating in their care.  A copy of this report was sent to the requesting provider on this date.  Electronically Signed: Simonne Come 01/21/2020, 2:06 PM   I spent a total of 15 Minutes in remote  clinical consultation, greater than  50% of which was counseling/coordinating care for knee pain.    Visit type: Audio only (telephone). Audio (no video) only due to patient's lack of internet/smartphone capability. Alternative for in-person consultation at [facility], [address] [place], [place], [place]. This visit type was conducted due to national recommendations for restrictions regarding the COVID-19 Pandemic (e.g. social distancing).  This format is felt to be most appropriate for this patient at this time.  All issues noted in this document were discussed and addressed.

## 2020-01-22 ENCOUNTER — Other Ambulatory Visit (HOSPITAL_COMMUNITY): Payer: Self-pay | Admitting: Interventional Radiology

## 2020-01-22 DIAGNOSIS — M25561 Pain in right knee: Secondary | ICD-10-CM

## 2020-02-01 ENCOUNTER — Other Ambulatory Visit (HOSPITAL_COMMUNITY): Payer: Self-pay | Admitting: Interventional Radiology

## 2020-02-01 DIAGNOSIS — M25562 Pain in left knee: Secondary | ICD-10-CM

## 2020-02-05 ENCOUNTER — Ambulatory Visit (HOSPITAL_COMMUNITY)
Admission: RE | Admit: 2020-02-05 | Discharge: 2020-02-05 | Disposition: A | Discharge status: Home / Self Care | Payer: Medicare Other | Source: Ambulatory Visit | Attending: Interventional Radiology | Admitting: Interventional Radiology

## 2020-02-05 ENCOUNTER — Other Ambulatory Visit: Payer: Self-pay

## 2020-02-05 DIAGNOSIS — M25562 Pain in left knee: Secondary | ICD-10-CM | POA: Diagnosis present

## 2020-02-05 HISTORY — PX: IR ABLATE LIVER CRYOABLATION: IMG5524

## 2020-02-05 MED ORDER — LIDOCAINE-EPINEPHRINE 1 %-1:100000 IJ SOLN
INTRAMUSCULAR | Status: AC
  Filled 2020-02-05: qty 1

## 2020-03-02 ENCOUNTER — Telehealth: Payer: Self-pay | Admitting: *Deleted

## 2020-03-02 NOTE — Telephone Encounter (Signed)
Phone call to Jennifer Huffman to inquire how she is doing from Left Knee Iovera.  She states she is doing better still has a little pain, it comes and goes. She would like to discuss treating the Right knee. Will setup with Dr. Wayland Salinas

## 2020-03-04 ENCOUNTER — Other Ambulatory Visit (HOSPITAL_COMMUNITY): Payer: Self-pay | Admitting: Interventional Radiology

## 2020-03-04 DIAGNOSIS — G8929 Other chronic pain: Secondary | ICD-10-CM

## 2020-03-11 ENCOUNTER — Ambulatory Visit (HOSPITAL_COMMUNITY)
Admission: RE | Admit: 2020-03-11 | Discharge: 2020-03-11 | Disposition: A | Discharge status: Home / Self Care | Payer: Medicare Other | Source: Ambulatory Visit | Attending: Interventional Radiology | Admitting: Interventional Radiology

## 2020-03-11 ENCOUNTER — Other Ambulatory Visit: Payer: Self-pay

## 2020-03-11 DIAGNOSIS — M25561 Pain in right knee: Secondary | ICD-10-CM | POA: Diagnosis not present

## 2020-03-11 DIAGNOSIS — G8929 Other chronic pain: Secondary | ICD-10-CM | POA: Insufficient documentation

## 2020-03-11 HISTORY — PX: IR ABLATE LIVER CRYOABLATION: IMG5524

## 2020-03-11 MED ORDER — LIDOCAINE-EPINEPHRINE 1 %-1:100000 IJ SOLN
INTRAMUSCULAR | Status: AC
  Administered 2020-03-11: 20 mL
  Filled 2020-03-11: qty 1

## 2020-03-11 MED ORDER — LIDOCAINE-EPINEPHRINE (PF) 2 %-1:200000 IJ SOLN: 10.0000 mL | Freq: Once | INTRAMUSCULAR | Status: DC

## 2020-03-18 ENCOUNTER — Telehealth: Payer: Self-pay | Admitting: *Deleted

## 2020-03-18 NOTE — Telephone Encounter (Signed)
Called Ms. Vallier to inquire how she is doing post IOVERA Therapy of Rt Knee.  She states " I am doing good no pain at all". I advised her to call us if needed.Pearla Dubonnet

## 2020-05-23 ENCOUNTER — Ambulatory Visit: Payer: Medicare Other | Admitting: Internal Medicine

## 2020-05-26 ENCOUNTER — Encounter: Payer: Self-pay | Admitting: Internal Medicine

## 2020-05-26 ENCOUNTER — Ambulatory Visit (INDEPENDENT_AMBULATORY_CARE_PROVIDER_SITE_OTHER): Payer: Medicare Other | Admitting: Internal Medicine

## 2020-05-26 ENCOUNTER — Other Ambulatory Visit: Payer: Self-pay

## 2020-05-26 VITALS — BP 128/80 | HR 69 | Ht 61.0 in | Wt 322.0 lb

## 2020-05-26 DIAGNOSIS — E039 Hypothyroidism, unspecified: Secondary | ICD-10-CM

## 2020-05-26 DIAGNOSIS — E785 Hyperlipidemia, unspecified: Secondary | ICD-10-CM | POA: Diagnosis not present

## 2020-05-26 DIAGNOSIS — R7303 Prediabetes: Secondary | ICD-10-CM

## 2020-05-26 LAB — COMPREHENSIVE METABOLIC PANEL
ALT: 17 U/L (ref 0–35)
AST: 14 U/L (ref 0–37)
Albumin: 4 g/dL (ref 3.5–5.2)
Alkaline Phosphatase: 89 U/L (ref 39–117)
BUN: 17 mg/dL (ref 6–23)
CO2: 29 mEq/L (ref 19–32)
Calcium: 9.7 mg/dL (ref 8.4–10.5)
Chloride: 102 mEq/L (ref 96–112)
Creatinine, Ser: 0.95 mg/dL (ref 0.40–1.20)
GFR: 58.55 mL/min — ABNORMAL LOW (ref >60.00)
Glucose, Bld: 107 mg/dL — ABNORMAL HIGH (ref 70–99)
Potassium: 4.3 mEq/L (ref 3.5–5.1)
Sodium: 139 mEq/L (ref 135–145)
Total Bilirubin: 0.4 mg/dL (ref 0.2–1.2)
Total Protein: 7.8 g/dL (ref 6.0–8.3)

## 2020-05-26 LAB — LIPID PANEL
Cholesterol: 172 mg/dL (ref 0–200)
HDL: 50.3 mg/dL (ref >39.00)
LDL Cholesterol: 98 mg/dL (ref 0–99)
NonHDL: 121.8
Total CHOL/HDL Ratio: 3
Triglycerides: 118 mg/dL (ref 0.0–149.0)
VLDL: 23.6 mg/dL (ref 0.0–40.0)

## 2020-05-26 LAB — POCT GLYCOSYLATED HEMOGLOBIN (HGB A1C): Hemoglobin A1C: 6.2 % — AB (ref 4.0–5.6)

## 2020-05-26 LAB — TSH: TSH: 3.16 u[IU]/mL (ref 0.35–4.50)

## 2020-05-26 LAB — T4, FREE: Free T4: 1.13 ng/dL (ref 0.60–1.60)

## 2020-05-26 MED ORDER — METFORMIN HCL 500 MG PO TABS: 500.0000 mg | ORAL_TABLET | Freq: Two times a day (BID) | ORAL | 3 refills | Status: DC

## 2020-05-26 NOTE — Progress Notes (Signed)
Patient ID: Jennifer Huffman, female   DOB: [birth year], 69 y.o.   MRN: [id number]  This visit occurred during the SARS-CoV-2 public health emergency.  Safety protocols were in place, including screening questions prior to the visit, additional usage of staff PPE, and extensive cleaning of exam room while observing appropriate contact time as indicated for disinfecting solutions.   HPI: Jennifer Huffman is a 69 y.o.-year-old female, returning for f/u for prediabetes, dx in ~2014, controlled uncontrolled, without complications. She moved from [place] in 08/2015. Last visit 6 months ago.  Prediabetes:  Reviewed HbA1c levels: Lab Results  Component Value Date   HGBA1C 6.2 (A) 11/24/2019   HGBA1C 6.0 (A) 05/26/2019   HGBA1C 6.2 (A) 11/24/2018  05/22/2018: HbA1c 5.8% 10/31/2015: HbA1c 6.5%  Pt is on: - Metformin 500 mg 2x a day started 08/2017.  No GI side effects.  She continues to not check sugars at home, despite multiple promptings. She has a FreeStyle Libre CGM >> irritated her skin >> she stopped using it.  Glucometer: Contour next  -No history of CKD, last BUN/creatinine:  04/07/2019: 16/1.03 Lab Results  Component Value Date   BUN 15 07/01/2018   Lab Results  Component Value Date   CREATININE 1.11 (H) 07/01/2018  05/08/2018: 16.4/1.10, ACR 1.0 07/03/2016: 11/0.83, glucose 98 10/31/2015: 16/0.87, Glucose 113 On telmisartan.  -+ HL; last set of lipids: 04/07/2019: 191/119/55/112 04/01/2018: 195/105/60/114 Lab Results  Component Value Date   CHOL 179 12/21/2016   HDL 51.60 12/21/2016   LDLCALC 102 (H) 12/21/2016   TRIG 128.0 12/21/2016   CHOLHDL 3 12/21/2016  10/31/2015: 175/82/50/104 On atorvastatin.  -Latest eye exam 09/2019: No DR; she had cataract surgeries in 02 in 11/2018.  -She denies numbness and tingling in her feet.  On ASA 81.  She has a history of HTN, lymphedema, OA of her knees, mild intermittent asthma, eczema.   Hypothyroidism:  TFTs  reviewed: Lab Results  Component Value Date   TSH 3.08 05/26/2019   TSH 3.08 07/04/2017   TSH 1.27 12/21/2016  04/01/2018: TSH 3.80 03/06/2016: TSH 4.74 10/31/2015: TSH 4.36 (0.34-4.5)  Pt is on levothyroxine 150 mcg daily, taken: - in am - fasting - at least 30 min from b'fast - no Ca, Fe, PPIs, + MVIs later in the day - not on Biotin  She had a kidney stone removed - 05/08/2018.   ROS: Constitutional: no weight gain/no weight loss, no fatigue, no subjective hyperthermia, no subjective hypothermia Eyes: no blurry vision, no xerophthalmia ENT: no sore throat, no nodules palpated in neck, no dysphagia, no odynophagia, no hoarseness Cardiovascular: no CP/no SOB/no palpitations/+ leg swelling Respiratory: no cough/no SOB/no wheezing Gastrointestinal: no N/no V/no D/no C/no acid reflux Musculoskeletal: no muscle aches/+ joint aches (knees) Skin: no rashes, no hair loss Neurological: + tremors/no numbness/no tingling/no dizziness  I reviewed pt's medications, allergies, PMH, social hx, family hx, and changes were documented in the history of present illness. Otherwise, unchanged from my initial visit note.  Past Medical History:  Diagnosis Date  . Asthma, mild intermittent   . Blurred vision, bilateral   . Diabetes mellitus without complication (HCC)    type 2  . Dyspnea    with exertion  . History of kidney stones   . Hyperlipidemia   . Hypertension   . Hypothyroidism   . Lymphedema of both lower extremities    compressions hose when out anf wraps at home  . Neuromuscular disorder (HCC)   . Osteoarthritis of both knees  Past Surgical History:  Procedure Laterality Date  . ABDOMINAL HYSTERECTOMY     partial  . COLONOSCOPY WITH PROPOFOL N/A 01/17/2017   Procedure: COLONOSCOPY WITH PROPOFOL;  Surgeon: Charlott RakesVincent Schooler, MD;  Location: [facility];  Service: Endoscopy;  Laterality: N/A;  . CYSTOSCOPY/RETROGRADE/URETEROSCOPY/STONE EXTRACTION WITH BASKET Right 06/30/2018    Procedure: CYSTOSCOPY/RETROGRADE/URETEROSCOPY/STONE EXTRACTION WITH BASKET INSERTION DOUBLE J STENT;  Surgeon: Bjorn PippinWrenn, John, MD;  Location: [facility];  Service: Urology;  Laterality: Right;  . EXTRACORPOREAL SHOCK WAVE LITHOTRIPSY Right 07/31/2018   Procedure: RIGHT EXTRACORPOREAL SHOCK WAVE LITHOTRIPSY (ESWL);  Surgeon: Heloise PurpuraBorden, Lester, MD;  Location: [facility];  Service: Urology;  Laterality: Right;  . HOLMIUM LASER APPLICATION Right 06/30/2018   Procedure: HOLMIUM LASER APPLICATION;  Surgeon: Bjorn PippinWrenn, John, MD;  Location: [facility];  Service: Urology;  Laterality: Right;  . IR ABLATE LIVER CRYOABLATION  02/05/2020  . IR ABLATE LIVER CRYOABLATION  03/11/2020  . IR RADIOLOGIST EVAL & MGMT  01/21/2020  . TUBAL LIGATION    . URETEROSCOPY WITH HOLMIUM LASER LITHOTRIPSY Left 05/07/2018   Social History   Social History  . Marital Status: Legally Separated    Spouse Name: N/A  . Number of Children: 2   Occupational History  . retired   Social History Main Topics  . Smoking status: Never Smoker   . Smokeless tobacco: Never Used  . Alcohol Use: Vodka 1-2 drinks socially  . Drug Use: No   Current Outpatient Medications on File Prior to Visit  Medication Sig Dispense Refill  . albuterol (PROVENTIL HFA;VENTOLIN HFA) 108 (90 Base) MCG/ACT inhaler Inhale 2 puffs into the lungs every 4 (four) hours as needed for wheezing or shortness of breath.    Marland Kitchen. amLODipine (NORVASC) 5 MG tablet Take 5 mg by mouth daily.    Marland Kitchen. aspirin 81 MG tablet Take 81 mg by mouth daily.    Marland Kitchen. atorvastatin (LIPITOR) 80 MG tablet Take 1 tablet (80 mg total) by mouth daily. 90 tablet 3  . BAYER MICROLET LANCETS lancets Use as instructed used to check one time a day 100 each 5  . Fluticasone-Salmeterol (ADVAIR) 250-50 MCG/DOSE AEPB Inhale 1 puff into the lungs 2 (two) times daily.     . furosemide (LASIX) 40 MG tablet Take 40 mg by mouth daily.    Marland Kitchen. gabapentin (NEURONTIN) 300 MG capsule TAKE 1 CAPSULE BY MOUTH TWICE A DAY 60 capsule 0  .  glucose blood (BAYER CONTOUR NEXT TEST) test strip Use as instructed testing one time a day. 100 each 5  . HYDROcodone-acetaminophen (NORCO) 5-325 MG tablet Take 1 tablet by mouth every 6 (six) hours as needed for moderate pain. 15 tablet 0  . levothyroxine (SYNTHROID) 150 MCG tablet Take 1 tablet (150 mcg total) by mouth daily before breakfast. 90 tablet 3  . metFORMIN (GLUCOPHAGE) 500 MG tablet TAKE 1 TABLET BY MOUTH TWICE A DAY WITH MEALS 180 tablet 2  . Multiple Vitamins-Minerals (MULTIVITAMIN WITH MINERALS) tablet Take 1 tablet by mouth daily.    . potassium chloride (KLOR-CON 10) 10 MEQ tablet Take 20 mEq by mouth daily.     . tamsulosin (FLOMAX) 0.4 MG CAPS capsule Take 0.4 mg by mouth daily.    Marland Kitchen. telmisartan (MICARDIS) 80 MG tablet Take 80 mg by mouth daily.    . traMADol (ULTRAM) 50 MG tablet Take 1 tablet (50 mg total) by mouth every 6 (six) hours as needed. 30 tablet 0   No current facility-administered medications on file prior to visit.   No  Known Allergies Family History  Problem Relation Age of Onset  . Cancer Mother        breast  . Breast cancer Mother 2  . Hypertension Father   . Breast cancer Sister 7  . Breast cancer Sister 20   PE: BP 128/80   Pulse 69   Ht 5\' 1"  (1.549 m)   Wt (!) 322 lb (146.1 kg)   SpO2 96%   BMI 60.84 kg/m  Wt Readings from Last 3 Encounters:  05/26/20 (!) 322 lb (146.1 kg)  11/24/19 (!) 322 lb (146.1 kg)  05/26/19 (!) 329 lb (149.2 kg)   Constitutional: overweight, in NAD, walks with a walker Eyes: PERRLA, EOMI, no exophthalmos ENT: moist mucous membranes, no thyromegaly, no cervical lymphadenopathy Cardiovascular: RRR, No MRG, + bilateral significant lymphedema with right leg larger than the left Respiratory: CTA B Gastrointestinal: abdomen soft, NT, ND, BS+ Musculoskeletal: no deformities, strength intact in all 4 Skin: moist, warm, no rashes Neurological: + tremor with outstretched hands, DTR normal in all 4  ASSESSMENT: 1.  Prediabetes  2. Hypothyroidism  3.  HL  PLAN:  1. Patient with history of prediabetes, continuing on low-dose Metformin twice a day which appears to be working well for her.  She has no side effects from Metformin.  At last visit, HbA1c was slightly higher, at 6.2%.  She was not checking sugars at all and I strongly advised her to start checking every day or every other day, rotating check times.  We did not change her regimen at that time. -At this visit, she is still not checking sugars.  I again advised her to do so. - we checked her HbA1c: 6.2% (stable) -For now, we will continue the current regimen - I suggested to:  Patient Instructions  Please continue: - Metformin 500 mg 2x a day with meals.  Try to check sugars every day or every other day.  Continue levothyroxine 150 mcg daily.  Take the thyroid hormone every day, with water, at least 30 minutes before breakfast, separated by at least 4 hours from: - acid reflux medications - calcium - iron - multivitamins  Please come back for a follow-up appointment in 6 months.  - advised to check sugars at different times of the day - 1x a day or every other day, rotating check times - advised for yearly eye exams >> she is UTD - will check a CMP and ACR today - return to clinic in 6 months   2. Hypothyroidism - latest thyroid labs reviewed with pt >> normal: Lab Results  Component Value Date   TSH 3.08 05/26/2019   - she continues on LT4 150 mcg daily - pt feels good on this dose. - we discussed about taking the thyroid hormone every day, with water, >30 minutes before breakfast, separated by >4 hours from acid reflux medications, calcium, iron, multivitamins. Pt. is taking it correctly. - will check thyroid tests today: TSH and fT4 - If labs are abnormal, she will need to return for repeat TFTs in 1.5 months  3.  HL - Reviewed latest lipid panel from 04/2019: LDL was slightly above target (?  Compliance) - Continues  atorvastatin without side effects - will recheck today (she only had coffee today)  Needs refills for LT4.  Component     Latest Ref Rng & Units 05/26/2020  Sodium     135 - 145 mEq/L 139  Potassium     3.5 - 5.1 mEq/L 4.3  Chloride  96 - 112 mEq/L 102  CO2     19 - 32 mEq/L 29  Glucose     70 - 99 mg/dL 193 (H)  BUN     6 - 23 mg/dL 17  Creatinine     7.90 - 1.20 mg/dL 2.40  Total Bilirubin     0.2 - 1.2 mg/dL 0.4  Alkaline Phosphatase     39 - 117 U/L 89  AST     0 - 37 U/L 14  ALT     0 - 35 U/L 17  Total Protein     6.0 - 8.3 g/dL 7.8  Albumin     3.5 - 5.2 g/dL 4.0  GFR     >97.35 mL/min 58.55 (L)  Calcium     8.4 - 10.5 mg/dL 9.7  Cholesterol     0 - 200 mg/dL 329  Triglycerides     0 - 149 mg/dL 924.2  HDL Cholesterol     >39.00 mg/dL 68.34  VLDL     0.0 - 19.6 mg/dL 22.2  LDL (calc)     0 - 99 mg/dL 98  Total CHOL/HDL Ratio      3  NonHDL      121.80  TSH     0.35 - 4.50 uIU/mL 3.16  T4,Free(Direct)     0.60 - 1.60 ng/dL 9.79   Labs normal which is slightly decreased GFR, which is not new.  We will refill her levothyroxine.  Carlus Pavlov, MD PhD [facility] Endocrinology

## 2020-05-26 NOTE — Patient Instructions (Signed)
Please continue: - Metformin 500 mg 2x a day with meals.  Try to check sugars every day or every other day.  Continue levothyroxine 150 mcg daily.  Take the thyroid hormone every day, with water, at least 30 minutes before breakfast, separated by at least 4 hours from: - acid reflux medications - calcium - iron - multivitamins  Please stop at the lab.  Please come back for a follow-up appointment in 6 months. 

## 2020-06-08 MED ORDER — LEVOTHYROXINE SODIUM 150 MCG PO TABS
150.0000 ug | ORAL_TABLET | Freq: Every day | ORAL | 3 refills | Status: DC
Start: 2020-06-08 — End: 2022-10-11

## 2020-07-07 ENCOUNTER — Other Ambulatory Visit: Payer: Self-pay | Admitting: Internal Medicine

## 2020-07-07 DIAGNOSIS — Z1231 Encounter for screening mammogram for malignant neoplasm of breast: Secondary | ICD-10-CM

## 2020-07-08 ENCOUNTER — Ambulatory Visit
Admission: RE | Admit: 2020-07-08 | Discharge: 2020-07-08 | Disposition: A | Discharge status: Home / Self Care | Payer: Medicare Other | Source: Ambulatory Visit | Attending: Internal Medicine | Admitting: Internal Medicine

## 2020-07-08 ENCOUNTER — Other Ambulatory Visit: Payer: Self-pay

## 2020-07-08 DIAGNOSIS — Z1231 Encounter for screening mammogram for malignant neoplasm of breast: Secondary | ICD-10-CM

## 2020-09-06 DIAGNOSIS — M170 Bilateral primary osteoarthritis of knee: Secondary | ICD-10-CM | POA: Diagnosis not present

## 2020-09-06 DIAGNOSIS — E039 Hypothyroidism, unspecified: Secondary | ICD-10-CM | POA: Diagnosis not present

## 2020-09-06 DIAGNOSIS — I10 Essential (primary) hypertension: Secondary | ICD-10-CM | POA: Diagnosis not present

## 2020-09-06 DIAGNOSIS — E785 Hyperlipidemia, unspecified: Secondary | ICD-10-CM | POA: Diagnosis not present

## 2020-09-06 DIAGNOSIS — J4520 Mild intermittent asthma, uncomplicated: Secondary | ICD-10-CM | POA: Diagnosis not present

## 2020-10-12 DIAGNOSIS — Z23 Encounter for immunization: Secondary | ICD-10-CM | POA: Diagnosis not present

## 2020-10-12 DIAGNOSIS — J4520 Mild intermittent asthma, uncomplicated: Secondary | ICD-10-CM | POA: Diagnosis not present

## 2020-10-12 DIAGNOSIS — I890 Lymphedema, not elsewhere classified: Secondary | ICD-10-CM | POA: Diagnosis not present

## 2020-10-12 DIAGNOSIS — I10 Essential (primary) hypertension: Secondary | ICD-10-CM | POA: Diagnosis not present

## 2020-10-18 DIAGNOSIS — J4520 Mild intermittent asthma, uncomplicated: Secondary | ICD-10-CM | POA: Diagnosis not present

## 2020-10-18 DIAGNOSIS — E039 Hypothyroidism, unspecified: Secondary | ICD-10-CM | POA: Diagnosis not present

## 2020-10-18 DIAGNOSIS — E785 Hyperlipidemia, unspecified: Secondary | ICD-10-CM | POA: Diagnosis not present

## 2020-10-18 DIAGNOSIS — M170 Bilateral primary osteoarthritis of knee: Secondary | ICD-10-CM | POA: Diagnosis not present

## 2020-10-18 DIAGNOSIS — I10 Essential (primary) hypertension: Secondary | ICD-10-CM | POA: Diagnosis not present

## 2020-11-08 DIAGNOSIS — M1712 Unilateral primary osteoarthritis, left knee: Secondary | ICD-10-CM | POA: Diagnosis not present

## 2020-11-08 DIAGNOSIS — M1711 Unilateral primary osteoarthritis, right knee: Secondary | ICD-10-CM | POA: Diagnosis not present

## 2020-11-08 DIAGNOSIS — Z6841 Body Mass Index (BMI) 40.0 and over, adult: Secondary | ICD-10-CM | POA: Diagnosis not present

## 2020-11-08 DIAGNOSIS — M170 Bilateral primary osteoarthritis of knee: Secondary | ICD-10-CM | POA: Diagnosis not present

## 2020-11-08 DIAGNOSIS — M25531 Pain in right wrist: Secondary | ICD-10-CM | POA: Diagnosis not present

## 2020-11-29 ENCOUNTER — Ambulatory Visit (INDEPENDENT_AMBULATORY_CARE_PROVIDER_SITE_OTHER): Payer: Medicare Other | Admitting: Internal Medicine

## 2020-11-29 ENCOUNTER — Other Ambulatory Visit: Payer: Self-pay

## 2020-11-29 ENCOUNTER — Encounter: Payer: Self-pay | Admitting: Internal Medicine

## 2020-11-29 VITALS — BP 128/80 | HR 74 | Ht 61.0 in | Wt 325.2 lb

## 2020-11-29 DIAGNOSIS — E039 Hypothyroidism, unspecified: Secondary | ICD-10-CM

## 2020-11-29 DIAGNOSIS — R7303 Prediabetes: Secondary | ICD-10-CM

## 2020-11-29 DIAGNOSIS — E785 Hyperlipidemia, unspecified: Secondary | ICD-10-CM | POA: Diagnosis not present

## 2020-11-29 LAB — POCT GLYCOSYLATED HEMOGLOBIN (HGB A1C): Hemoglobin A1C: 6.1 % — AB (ref 4.0–5.6)

## 2020-11-29 NOTE — Progress Notes (Signed)
Patient ID: Jennifer Huffman, female   DOB: [birth year], 69 y.o.   MRN: [id number]  This visit occurred during the SARS-CoV-2 public health emergency.  Safety protocols were in place, including screening questions prior to the visit, additional usage of staff PPE, and extensive cleaning of exam room while observing appropriate contact time as indicated for disinfecting solutions.   HPI: Jennifer Huffman is a 70 y.o.-year-old female, returning for f/u for prediabetes, dx in ~2014, controlled uncontrolled, without complications. She moved from [place] in 08/2015. Last visit 6 months ago.  Prediabetes:  Reviewed HbA1c levels: Lab Results  Component Value Date   HGBA1C 6.2 (A) 05/26/2020   HGBA1C 6.2 (A) 11/24/2019   HGBA1C 6.0 (A) 05/26/2019  05/22/2018: HbA1c 5.8% 10/31/2015: HbA1c 6.5%  Pt is on: - Metformin 500 mg 2x a day started 08/2017.  Denies GI side effects.  She continues to not check sugars at home, despite multiple promptings. She tried a freestyle libre CGM in the past but this irritated her skin so she stopped it.  Glucometer: Contour Next  -No CKD, last BUN/creatinine:  Lab Results  Component Value Date   BUN 17 05/26/2020   Lab Results  Component Value Date   CREATININE 0.95 05/26/2020  04/07/2019: 16/1.03 05/08/2018: 16.4/1.10, ACR 1.0 07/03/2016: 11/0.83, glucose 98 10/31/2015: 16/0.87, Glucose 113 On telmisartan.  -+ HL; latest lipids: Lab Results  Component Value Date   CHOL 172 05/26/2020   HDL 50.30 05/26/2020   LDLCALC 98 05/26/2020   TRIG 118.0 05/26/2020   CHOLHDL 3 05/26/2020  04/07/2019: 191/119/55/112 04/01/2018: 195/105/60/114 10/31/2015: 175/82/50/104 On atorvastatin 80.  -Latest eye exam 2022: reportedly No DR; she had cataract surgeries in 02 in 11/2018.  -no numbness and tingling in her feet.  On ASA 81.  She has a history of HTN, lymphedema, OA of her knees, mild intermittent asthma, eczema.   Hypothyroidism:  Reviewed her  TFTs: Lab Results  Component Value Date   TSH 3.16 05/26/2020   TSH 3.08 05/26/2019   TSH 3.08 07/04/2017   TSH 1.27 12/21/2016  04/01/2018: TSH 3.80 03/06/2016: TSH 4.74 10/31/2015: TSH 4.36 (0.34-4.5)  Pt is on levothyroxine 150 mcg daily, taken: - in am - fasting - at least 30 min from b'fast - no calcium - no iron - + multivitamins later in the day - no PPIs - not on Biotin  She had a kidney stone removed - 05/08/2018.   Stopped Lasix >> now on Chlorthalidone.  ROS: Constitutional: no weight gain/no weight loss, no fatigue, no subjective hyperthermia, no subjective hypothermia Eyes: no blurry vision, no xerophthalmia ENT: no sore throat, no nodules palpated in neck, no dysphagia, no odynophagia, no hoarseness Cardiovascular: no CP/no SOB/no palpitations/no leg swelling Respiratory: no cough/no SOB/no wheezing Gastrointestinal: no N/no V/no D/no C/no acid reflux Musculoskeletal: no muscle aches/+ joint aches (knees) Skin: no rashes, no hair loss Neurological: no tremors/no numbness/no tingling/no dizziness  I reviewed pt's medications, allergies, PMH, social hx, family hx, and changes were documented in the history of present illness. Otherwise, unchanged from my initial visit note.  Past Medical History:  Diagnosis Date  . Asthma, mild intermittent   . Blurred vision, bilateral   . Diabetes mellitus without complication (HCC)    type 2  . Dyspnea    with exertion  . History of kidney stones   . Hyperlipidemia   . Hypertension   . Hypothyroidism   . Lymphedema of both lower extremities    compressions hose when out anf wraps at  home  . Neuromuscular disorder (HCC)   . Osteoarthritis of both knees    Past Surgical History:  Procedure Laterality Date  . ABDOMINAL HYSTERECTOMY     partial  . COLONOSCOPY WITH PROPOFOL N/A 01/17/2017   Procedure: COLONOSCOPY WITH PROPOFOL;  Surgeon: Charlott Rakes, MD;  Location: [facility];  Service: Endoscopy;   Laterality: N/A;  . CYSTOSCOPY/RETROGRADE/URETEROSCOPY/STONE EXTRACTION WITH BASKET Right 06/30/2018   Procedure: CYSTOSCOPY/RETROGRADE/URETEROSCOPY/STONE EXTRACTION WITH BASKET INSERTION DOUBLE J STENT;  Surgeon: Bjorn Pippin, MD;  Location: [facility];  Service: Urology;  Laterality: Right;  . EXTRACORPOREAL SHOCK WAVE LITHOTRIPSY Right 07/31/2018   Procedure: RIGHT EXTRACORPOREAL SHOCK WAVE LITHOTRIPSY (ESWL);  Surgeon: Heloise Purpura, MD;  Location: [facility];  Service: Urology;  Laterality: Right;  . HOLMIUM LASER APPLICATION Right 06/30/2018   Procedure: HOLMIUM LASER APPLICATION;  Surgeon: Bjorn Pippin, MD;  Location: [facility];  Service: Urology;  Laterality: Right;  . IR ABLATE LIVER CRYOABLATION  02/05/2020  . IR ABLATE LIVER CRYOABLATION  03/11/2020  . IR RADIOLOGIST EVAL & MGMT  01/21/2020  . TUBAL LIGATION    . URETEROSCOPY WITH HOLMIUM LASER LITHOTRIPSY Left 05/07/2018   Social History   Social History  . Marital Status: Legally Separated    Spouse Name: N/A  . Number of Children: 2   Occupational History  . retired   Social History Main Topics  . Smoking status: Never Smoker   . Smokeless tobacco: Never Used  . Alcohol Use: Vodka 1-2 drinks socially  . Drug Use: No   Current Outpatient Medications on File Prior to Visit  Medication Sig Dispense Refill  . albuterol (PROVENTIL HFA;VENTOLIN HFA) 108 (90 Base) MCG/ACT inhaler Inhale 2 puffs into the lungs every 4 (four) hours as needed for wheezing or shortness of breath.    Marland Kitchen amLODipine (NORVASC) 5 MG tablet Take 5 mg by mouth daily.    Marland Kitchen aspirin 81 MG tablet Take 81 mg by mouth daily.    Marland Kitchen atorvastatin (LIPITOR) 80 MG tablet Take 1 tablet (80 mg total) by mouth daily. 90 tablet 3  . BAYER MICROLET LANCETS lancets Use as instructed used to check one time a day 100 each 5  . Fluticasone-Salmeterol (ADVAIR) 250-50 MCG/DOSE AEPB Inhale 1 puff into the lungs 2 (two) times daily.     . furosemide (LASIX) 40 MG tablet Take 40 mg by mouth  daily.    Marland Kitchen gabapentin (NEURONTIN) 300 MG capsule TAKE 1 CAPSULE BY MOUTH TWICE A DAY 60 capsule 0  . glucose blood (BAYER CONTOUR NEXT TEST) test strip Use as instructed testing one time a day. 100 each 5  . HYDROcodone-acetaminophen (NORCO) 5-325 MG tablet Take 1 tablet by mouth every 6 (six) hours as needed for moderate pain. 15 tablet 0  . levothyroxine (SYNTHROID) 150 MCG tablet Take 1 tablet (150 mcg total) by mouth daily before breakfast. 90 tablet 3  . metFORMIN (GLUCOPHAGE) 500 MG tablet Take 1 tablet (500 mg total) by mouth 2 (two) times daily with a meal. 180 tablet 3  . Multiple Vitamins-Minerals (MULTIVITAMIN WITH MINERALS) tablet Take 1 tablet by mouth daily.    . potassium chloride (KLOR-CON 10) 10 MEQ tablet Take 20 mEq by mouth daily.     . tamsulosin (FLOMAX) 0.4 MG CAPS capsule Take 0.4 mg by mouth daily.    Marland Kitchen telmisartan (MICARDIS) 80 MG tablet Take 80 mg by mouth daily.    . traMADol (ULTRAM) 50 MG tablet Take 1 tablet (50 mg total) by mouth every 6 (  six) hours as needed. 30 tablet 0   No current facility-administered medications on file prior to visit.   No Known Allergies Family History  Problem Relation Age of Onset  . Cancer Mother        breast  . Breast cancer Mother 9  . Hypertension Father   . Breast cancer Sister 2  . Breast cancer Sister 20   PE: BP 128/80   Pulse 74   Ht 5\' 1"  (1.549 m)   Wt (!) 325 lb 3.2 oz (147.5 kg)   SpO2 92%   BMI 61.45 kg/m  Wt Readings from Last 3 Encounters:  11/29/20 (!) 325 lb 3.2 oz (147.5 kg)  05/26/20 (!) 322 lb (146.1 kg)  11/24/19 (!) 322 lb (146.1 kg)   Constitutional: obese, in NAD, walks with a walker Eyes: PERRLA, EOMI, no exophthalmos ENT: moist mucous membranes, no thyromegaly, no cervical lymphadenopathy Cardiovascular: RRR, No MRG + bilateral significant lymphedema in LE R>L Respiratory: CTA B Gastrointestinal: abdomen soft, NT, ND, BS+ Musculoskeletal: no deformities, strength intact in all 4 Skin:  moist, warm, no rashes Neurological: + tremor with outstretched hands, DTR normal in all 4  ASSESSMENT: 1. Prediabetes  2. Hypothyroidism  3.  HL  PLAN:  1. Patient with prediabetes, continuing on low-dose Metformin twice a day, which appears to be working well for her.  She has no side effects from Metformin. -At last visit, HbA1c was stable, at goal, at 6.2%.  At that time, she was still not checking blood sugars and I again advised her to do so. -For now, we can continue the current regimen.  She is still not checking blood sugars and I again advised her to try to check at least before coming back for her next visit. - I suggested to:  Patient Instructions  Please continue: - Metformin 500 mg 2x a day with meals.  Try to check sugars every day or every other day.  Continue levothyroxine 150 mcg daily.  Take the thyroid hormone every day, with water, at least 30 minutes before breakfast, separated by at least 4 hours from: - acid reflux medications - calcium - iron - multivitamins  Please come back for a follow-up appointment in 6 months.  - we checked her HbA1c: 6.1% (slightly better) - advised to check sugars at different times of the day - 1x a day, rotating check times - advised for yearly eye exams >> she is UTD - return to clinic in 6 months   2. Hypothyroidism - latest thyroid labs reviewed with pt >> normal: Lab Results  Component Value Date   TSH 3.16 05/26/2020   - she continues on LT4 150 mcg daily - pt feels good on this dose. - we discussed about taking the thyroid hormone every day, with water, >30 minutes before breakfast, separated by >4 hours from acid reflux medications, calcium, iron, multivitamins. Pt. is taking it correctly. -We will repeat these at next visit  3.  HL - Reviewed latest lipid panel from last visit: Fractions at goal: Lab Results  Component Value Date   CHOL 172 05/26/2020   HDL 50.30 05/26/2020   LDLCALC 98 05/26/2020   TRIG  118.0 05/26/2020   CHOLHDL 3 05/26/2020  -Continues atorvastatin 80 mg daily without side effects  05/28/2020, MD PhD [facility] Endocrinology

## 2020-11-29 NOTE — Patient Instructions (Signed)
Please continue: - Metformin 500 mg 2x a day with meals.  Try to check sugars every day or every other day.  Continue levothyroxine 150 mcg daily.  Take the thyroid hormone every day, with water, at least 30 minutes before breakfast, separated by at least 4 hours from: - acid reflux medications - calcium - iron - multivitamins  Please stop at the lab.  Please come back for a follow-up appointment in 6 months.

## 2020-12-05 DIAGNOSIS — E039 Hypothyroidism, unspecified: Secondary | ICD-10-CM | POA: Diagnosis not present

## 2020-12-05 DIAGNOSIS — E785 Hyperlipidemia, unspecified: Secondary | ICD-10-CM | POA: Diagnosis not present

## 2020-12-05 DIAGNOSIS — I10 Essential (primary) hypertension: Secondary | ICD-10-CM | POA: Diagnosis not present

## 2020-12-05 DIAGNOSIS — J4520 Mild intermittent asthma, uncomplicated: Secondary | ICD-10-CM | POA: Diagnosis not present

## 2020-12-05 DIAGNOSIS — M170 Bilateral primary osteoarthritis of knee: Secondary | ICD-10-CM | POA: Diagnosis not present

## 2020-12-15 ENCOUNTER — Other Ambulatory Visit: Payer: Self-pay

## 2020-12-15 ENCOUNTER — Encounter: Payer: Self-pay | Admitting: Occupational Therapy

## 2020-12-15 ENCOUNTER — Ambulatory Visit: Payer: Medicare Other | Attending: Internal Medicine | Admitting: Occupational Therapy

## 2020-12-15 DIAGNOSIS — I890 Lymphedema, not elsewhere classified: Secondary | ICD-10-CM | POA: Diagnosis not present

## 2020-12-15 NOTE — Patient Instructions (Signed)

## 2020-12-16 ENCOUNTER — Encounter: Payer: Self-pay | Admitting: Occupational Therapy

## 2020-12-16 NOTE — Therapy (Signed)
[place] [facility] MAIN [facility] SERVICES [address] [place], [place], [address] Phone: [phone]   Fax:  [phone]  Occupational Therapy Evaluation  Patient Details  Name: Jennifer Huffman MRN: [id number] Date of Birth: 08-07-1951 Referring Provider (OT): Lorenda Ishihara, MD   Encounter Date: 12/15/2020   OT End of Session - 12/15/20 1217    Visit Number 1    Number of Visits 8    Date for OT Re-Evaluation 03/15/21    OT Start Time 1100    OT Stop Time 1215    OT Time Calculation (min) 75 min    Activity Tolerance Patient tolerated treatment well;Patient limited by pain    Behavior During Therapy [facility] for tasks assessed/performed           Past Medical History:  Diagnosis Date  . Asthma, mild intermittent   . Blurred vision, bilateral   . Diabetes mellitus without complication (HCC)    type 2  . Dyspnea    with exertion  . History of kidney stones   . Hyperlipidemia   . Hypertension   . Hypothyroidism   . Lymphedema of both lower extremities    compressions hose when out anf wraps at home  . Neuromuscular disorder (HCC)   . Osteoarthritis of both knees     Past Surgical History:  Procedure Laterality Date  . ABDOMINAL HYSTERECTOMY     partial  . COLONOSCOPY WITH PROPOFOL N/A 01/17/2017   Procedure: COLONOSCOPY WITH PROPOFOL;  Surgeon: Charlott Rakes, MD;  Location: [facility];  Service: Endoscopy;  Laterality: N/A;  . CYSTOSCOPY/RETROGRADE/URETEROSCOPY/STONE EXTRACTION WITH BASKET Right 06/30/2018   Procedure: CYSTOSCOPY/RETROGRADE/URETEROSCOPY/STONE EXTRACTION WITH BASKET INSERTION DOUBLE J STENT;  Surgeon: Bjorn Pippin, MD;  Location: [facility];  Service: Urology;  Laterality: Right;  . EXTRACORPOREAL SHOCK WAVE LITHOTRIPSY Right 07/31/2018   Procedure: RIGHT EXTRACORPOREAL SHOCK WAVE LITHOTRIPSY (ESWL);  Surgeon: Heloise Purpura, MD;  Location: [facility];  Service: Urology;  Laterality: Right;  . HOLMIUM LASER APPLICATION  Right 06/30/2018   Procedure: HOLMIUM LASER APPLICATION;  Surgeon: Bjorn Pippin, MD;  Location: [facility];  Service: Urology;  Laterality: Right;  . IR ABLATE LIVER CRYOABLATION  02/05/2020  . IR ABLATE LIVER CRYOABLATION  03/11/2020  . IR RADIOLOGIST EVAL & MGMT  01/21/2020  . TUBAL LIGATION    . URETEROSCOPY WITH HOLMIUM LASER LITHOTRIPSY Left 05/07/2018    There were no vitals filed for this visit.   Subjective Assessment - 12/15/20 1113    Subjective  Jennifer Huffman is a 70 y o female referred to Occupational Therapy by Lorenda Ishihara, MD for evaluation and treatment of BLE lymphedema. Pt repors insidous onset in her 69s. She reports that she has previously undergone lymphedema treatment when she lived in [place], and her compression garments are worn out. Her goal for OT is to foregoe Complete Decongestive Therapy (CDT) and to obtain replacxement custom compression garments.    Pertinent History BLE Lymphedema (LE) Onset in 40's without precipitating event; maternal cousin + for LE; HTN,    Limitations difficulty walking, decreased dynamic balance, chronic leg swelling and associated pain, chronic OA pain in B knees, impaired endurance, decreased strength, impaired self care and basic ADLs, impaired instrumental ADLs, impaired performance of productive activities and leisure pursuits, decreased social participation    Repetition Increases Symptoms    Special Tests mildly + Stemmer sign bilaterally    Patient Stated Goals replace worn out compression stockings    Currently in Pain? Yes  Pain Location Knee    Pain Orientation Right;Left    Pain Descriptors / Indicators Throbbing;Aching;Sharp;Shooting    Pain Type Chronic pain    Pain Onset More than a month ago    Pain Frequency Intermittent    Aggravating Factors  weight bearing; hot weather, standing , ambulation    Pain Relieving Factors compression garments, leggings, wraps, elevation, pain meds    Effect of Pain on Daily  Activities knee pain / OA pain limits standing tolerance (<5 min) , ability to ambulate (<5 min)             [facility] OT Assessment - 12/15/20 1143      Assessment   Referring Provider (OT) Lorenda Ishihara, MD    Onset Date/Surgical Date --   in   early 87's ~2000   Hand Dominance Right    Prior Therapy has "lymphedema therapy" in [place] ~ 6 years ago. Tolerated knee length wraps only.  Knee length Compression stockings (worn out) wraps (worn out),      Precautions   Precautions Fall      Balance Screen   Has the patient fallen in the past 6 months No      Home  Environment   Family/patient expects to be discharged to: Private residence    Living Arrangements Other(Comment)    Available Help at Discharge --   sister and friend   Type of Home House    Home Access Ramped entrance    Home Layout One level    Bathroom Shower/Tub Tub/Shower unit;Other (comment)   reportedly uses walker and has help x1 getting into tub then sits to shower   Information systems manager    Home Equipment Cane -quad;Walker - 2 wheels;Other (comment);Hand held shower head;Shower Geophysicist/field seismologist     Prior Function   Vocation Retired    Leisure TV, going out to eat, family and friends      IADL   Prior Level of Loss adjuster, chartered for transportation;Shops independently for small purchases   uses motorized scooters inside store. friend brings scooter to car   Prior Level of Function Light Housekeeping Max A    Light Housekeeping Needs help with all home maintenance tasks;Does personal laundry completely;Launders small items, rinses stockings, etc.    Prior Level of Function Meal Prep Max A    Meal Prep Able to complete simple cold meal and snack prep   must sit to prep food   Community Mobility Drives own vehicle      Mobility   Mobility Status Needs assist    Mobility Status Comments impaired bed mobility. Needs handholds      Vision - History   Baseline Vision Wears  glasses all the time      Activity Tolerance   Activity Tolerance --   standing and walking tolerance < 5 min; Decreased   endurance observed during transfers     Cognition   Overall Cognitive Status Within Functional Limits for tasks assessed      Observation/Other Assessments   Observations symmetrical BLE lymphedema from foot to groin, Deep skin creases at ankles with fibrotic "shelf above. Pt denies hypersensitivity. Although tissue is very fatty and soft, it is not doughy or hypersensative as with lipedema/ lipo-lymphedema. Skin well hydrated. Redness at distal leg risidual after episode of cellulitis. Color and temp WNL on R.    Skin Integrity well hydrated, temp WNL.    Focus on Therapeutic Outcomes (  FOTO)  Patient's intake functional measure is 34 on a scale of 0 - 100 (higher number = greater function). Given the patient's risk-adjustment variables, like-patients nationally had a FS score of 50 at intake      Posture/Postural Control   Posture/Postural Control Postural limitations    Postural Limitations Rounded Shoulders;Forward head      Sensation   Light Touch Appears Intact      Coordination   Gross Motor Movements are Fluid and Coordinated No      AROM   Overall AROM  Deficits;Other (comment)   limited in hips and ankles due to body habitus and skin approximation; limited in knees due to OA pain     Strength   Overall Strength Deficits                    OT Treatments/Exercises (OP) - 12/15/20 1143      Bed Mobility   Bed Mobility --   needs handhold for bed mobility- modified independence     Transfers   Transfers Sit to Stand;Stand to Sit      ADLs   Grooming set up    UB Dressing extra time    LB Dressing extra time    Functional Mobility all transfers limited    Cooking unable to stand to cook    Home Maintenance unable    Driving drives locally    Work no productive activities    Leisure gym 3 x weekly, otherwise sedentary    ADL Education  Given Yes      Manual Therapy   Manual Therapy Edema management                 OT Education - 12/15/20 1244    Education Details Provided Pt education regarding lymphatic structure and function, etiologies, onset patterns and stages of progression. Discussed  impact of obesity on lymphatic function. Outlined Complete Decongestive Therapy (CDT)  as standard of care and provided in depth information regarding 4 primary components of both Intensive and Self Management Phases, including Manual Lymph Drainage (MLD), compression wrapping and garments, skin care, and therapeutic exercise.   Homero FellersFrank discussion of high burden of care and contributing impact of existing co morbidities. We discussed  Importance of daily, ongoing LE self-care essential to retaining clinical gains and limiting progression.  Lastly, reviewed lymphedema precautions, including cellulitis risk and difficulty with wound healing. Provided printed Lymphedema Workbook for reference.    Person(s) Educated Patient    Methods Explanation    Comprehension Verbalized understanding;Returned demonstration;Need further instruction               OT Long Term Goals - 12/16/20 1321      OT LONG TERM GOAL #1   Title Fit Pt with appropriate compression garments that fit well and are comfortable for daily wear.    Baseline dependent    Time 90    Period Days    Status New    Target Date 03/15/21      OT LONG TERM GOAL #2   Title Pt able to don and doff custom compression garments and devices using assistive devices in a timely way (modified indpendence).    Baseline Min A    Time 90    Period Days    Status New    Target Date 03/15/21      OT LONG TERM GOAL #3   Title Pt mod independent with lymphedema precautions to limit progression by naming 6  precautions/ prevention strategies using handout as reference.    Baseline Max A    Time 90    Period Days    Status New    Target Date 03/15/21      OT LONG TERM GOAL #4    Title Pt will increase functional score on FOTO measurment too by 3 points by DC to improve functional performance with safe ambulation.    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 Swelling is fatty, symetrical and distributed in hips, thighs and buttocks primarily. Feet are essentialy spared. Her  presentation is in keeping with Stage III, Type IV lipedema. Pt prefers to forego Intensive and Management Phase Complete Decongestive Therapy (CDT) at this time and focus on replacing worn out custom, knee length compression stockings and adjustable, Velcro-stayle leg wraps for day and night use. Pt will return for anatomical measurements and fitting once DME vendor delivers garments.We will also review lymphedema precautions and provide skilled training for donning and doffing garments with appropriate assistive devices.    Occupational performance deficits (Please refer to evaluation for details): ADL's;IADL's;Work;Leisure;Social Participation    Rehab Potential Fair    Clinical Decision Making Several treatment options, min-mod task modification necessary    Comorbidities Affecting Occupational Performance: Presence of comorbidities impacting occupational performance    Modification or Assistance to Complete Evaluation  Max significant modification of tasks or assist is necessary to complete    OT Frequency --   8 visits and PRN for custom garment measurement and fitting   OT Treatment/Interventions Self-care/ADL training;DME and/or AE instruction;Patient/family education    Plan fir Pt with Like custom garments- Elvarex ccl 3 A-D, consider adding additional inside top band, and possibly vertical sb as garments do not stay up.Educare Py  re garment adhesive. Consider open to garments to ease donning    Consulted and Agree with Plan of Care Patient           Patient will benefit from skilled therapeutic intervention in order to improve the following deficits and impairments:           Visit Diagnosis: Lymphedema, not elsewhere classified - Plan: Ot plan of care cert/re-cert    Problem List Patient Active Problem List   Diagnosis Date Noted  . Kidney stone 06/30/2018  . Right ureteral stone 06/30/2018  . AKI (acute kidney injury) (HCC) 06/30/2018  . Hyperlipidemia 05/22/2018  . Special screening for malignant neoplasms, colon 01/17/2017  . Class 3 obesity with body mass index (BMI) of 60.0 to 69.9 in adult 11/16/2016  . Hypothyroidism 11/16/2016  . Prediabetes 02/20/2016  . Lymphedema 12/16/2015    Loel Dubonnet, MS, OTR/L, [facility] 12/16/20 1:27 PM  [facility] MAIN [facility] SERVICES [address] [place], [place], [address] Phone: [phone]   Fax:  [phone]  Name: Janita Camberos MRN: [id number] Date of Birth: 09-16-1951

## 2020-12-20 ENCOUNTER — Ambulatory Visit: Payer: Medicare Other | Admitting: Occupational Therapy

## 2020-12-22 ENCOUNTER — Ambulatory Visit: Payer: Medicare Other | Admitting: Occupational Therapy

## 2020-12-22 DIAGNOSIS — N200 Calculus of kidney: Secondary | ICD-10-CM | POA: Diagnosis not present

## 2020-12-22 DIAGNOSIS — J4520 Mild intermittent asthma, uncomplicated: Secondary | ICD-10-CM | POA: Diagnosis not present

## 2020-12-22 DIAGNOSIS — M170 Bilateral primary osteoarthritis of knee: Secondary | ICD-10-CM | POA: Diagnosis not present

## 2020-12-22 DIAGNOSIS — E559 Vitamin D deficiency, unspecified: Secondary | ICD-10-CM | POA: Diagnosis not present

## 2020-12-22 DIAGNOSIS — I10 Essential (primary) hypertension: Secondary | ICD-10-CM | POA: Diagnosis not present

## 2020-12-22 DIAGNOSIS — E039 Hypothyroidism, unspecified: Secondary | ICD-10-CM | POA: Diagnosis not present

## 2020-12-22 DIAGNOSIS — I890 Lymphedema, not elsewhere classified: Secondary | ICD-10-CM | POA: Diagnosis not present

## 2020-12-27 ENCOUNTER — Ambulatory Visit: Payer: Medicare Other | Admitting: Occupational Therapy

## 2020-12-27 ENCOUNTER — Other Ambulatory Visit: Payer: Self-pay

## 2020-12-27 DIAGNOSIS — I890 Lymphedema, not elsewhere classified: Secondary | ICD-10-CM | POA: Diagnosis not present

## 2020-12-27 NOTE — Therapy (Signed)
[place] [facility] MAIN [facility] SERVICES [address] [place], [place], [address] Phone: [phone]   Fax:  [phone]  Occupational Therapy Treatment  Patient Details  Name: Jennifer Huffman MRN: [id number] Date of Birth: 1950-10-07 Referring Provider (OT): Lorenda Ishihara, MD   Encounter Date: 12/27/2020   OT End of Session - 12/27/20 1125    Visit Number 2    Number of Visits 8    Date for OT Re-Evaluation 03/15/21    OT Start Time 0905    OT Stop Time 1013    OT Time Calculation (min) 68 min    Activity Tolerance Patient tolerated treatment well;Patient limited by pain;No increased pain    Behavior During Therapy WFL for tasks assessed/performed           Past Medical History:  Diagnosis Date  . Asthma, mild intermittent   . Blurred vision, bilateral   . Diabetes mellitus without complication (HCC)    type 2  . Dyspnea    with exertion  . History of kidney stones   . Hyperlipidemia   . Hypertension   . Hypothyroidism   . Lymphedema of both lower extremities    compressions hose when out anf wraps at home  . Neuromuscular disorder (HCC)   . Osteoarthritis of both knees     Past Surgical History:  Procedure Laterality Date  . ABDOMINAL HYSTERECTOMY     partial  . COLONOSCOPY WITH PROPOFOL N/A 01/17/2017   Procedure: COLONOSCOPY WITH PROPOFOL;  Surgeon: Charlott Rakes, MD;  [facility]: [facility];  Service: Endoscopy;  Laterality: N/A;  . CYSTOSCOPY/RETROGRADE/URETEROSCOPY/STONE EXTRACTION WITH BASKET Right 06/30/2018   Procedure: CYSTOSCOPY/RETROGRADE/URETEROSCOPY/STONE EXTRACTION WITH BASKET INSERTION DOUBLE J STENT;  Surgeon: Bjorn Pippin, MD;  [facility];  Service: Urology;  Laterality: Right;  . EXTRACORPOREAL SHOCK WAVE LITHOTRIPSY Right 07/31/2018   Procedure: RIGHT EXTRACORPOREAL SHOCK WAVE LITHOTRIPSY (ESWL);  Surgeon: Heloise Purpura, MD;  [facility];  Service: Urology;  Laterality: Right;  . HOLMIUM  LASER APPLICATION Right 06/30/2018   Procedure: HOLMIUM LASER APPLICATION;  Surgeon: Bjorn Pippin, MD;  [facility];  Service: Urology;  Laterality: Right;  . IR ABLATE LIVER CRYOABLATION  02/05/2020  . IR ABLATE LIVER CRYOABLATION  03/11/2020  . IR RADIOLOGIST EVAL & MGMT  01/21/2020  . TUBAL LIGATION    . URETEROSCOPY WITH HOLMIUM LASER LITHOTRIPSY Left 05/07/2018    There were no vitals filed for this visit.                 OT Treatments/Exercises (OP) - 12/27/20 1131      ADLs   ADL Education Given Yes      Manual Therapy   Manual Therapy Edema management    Edema Management '                  OT Education - 12/27/20 1132    Education Details Pt edu focused on differences between custom  lat knit and off-the-shelf circular elastic compression garments.  Educated Pt re indications for both and pros and cons of each, and rational for current recommendations  Educated Pt re measuring and fitting process, DME vendor's role and responsibilities with filing for insurance preauthorization and billing.    Person(s) Educated Patient;Other (comment)   Friend   Methods Explanation;Demonstration    Comprehension Verbalized understanding;Returned demonstration;Need further instruction               OT Long Term Goals - 12/16/20 1321  OT LONG TERM GOAL #1   Title Fit Pt with appropriate compression garments that fit well and are comfortable for daily wear.    Baseline dependent    Time 90    Period Days    Status New    Target Date 03/15/21      OT LONG TERM GOAL #2   Title Pt able to don and doff custom compression garments and devices using assistive devices in a timely way (modified indpendence).    Baseline Min A    Time 90    Period Days    Status New    Target Date 03/15/21      OT LONG TERM GOAL #3   Title Pt mod independent with lymphedema precautions to limit progression by naming 6 precautions/ prevention strategies using handout as  reference.    Baseline Max A    Time 90    Period Days    Status New    Target Date 03/15/21      OT LONG TERM GOAL #4   Title Pt will increase functional score on FOTO measurment too by 3 points by DC to improve functional performance with safe ambulation.    Baseline 35    Time 90    Period Days    Status New    Target Date 03/15/21                 Plan - 12/27/20 1126    Clinical Impression Statement Completed anatomical measurements for replacements for wor out, custom BLE, knee length, Elvarex compressin stockings and for worn out  CircAid JuxtaFit Essentials, knee length, adjustable compression stocking alternatives. Pt educated re options added since her last garments were ordered with a different theraPIST, AND discussed and agreed upon current specifications. Pt wil return for fitting when garments delivered. Pt verbalized understanding that custom garments from [employer] [place]  are taking an inordinate amout of time for shipping at present.    OT Occupational Profile and History Detailed Assessment- Review of Records and additional review of physical, cognitive, psychosocial history related to current functional performance    Occupational performance deficits (Please refer to evaluation for details): ADL's;IADL's;Work;Leisure;Social Participation    Rehab Potential Fair    Clinical Decision Making Several treatment options, min-mod task modification necessary    Comorbidities Affecting Occupational Performance: Presence of comorbidities impacting occupational performance    Modification or Assistance to Complete Evaluation  Max significant modification of tasks or assist is necessary to complete    OT Frequency --   8 visits and PRN for custom garment measurement and fitting   OT Treatment/Interventions Self-care/ADL training;DME and/or AE instruction;Patient/family education    Plan fir Pt with Like custom garments- Elvarex ccl 3 A-D, consider adding additional inside top band,  and possibly vertical sb as garments do not stay up.Educare Py re garment adhesive. Consider open to garments to ease donning    Consulted and Agree with Plan of Care Patient           Patient will benefit from skilled therapeutic intervention in order to improve the following deficits and impairments:           Visit Diagnosis: Lymphedema, not elsewhere classified    Problem List Patient Active Problem List   Diagnosis Date Noted  . Kidney stone 06/30/2018  . Right ureteral stone 06/30/2018  . AKI (acute kidney injury) (HCC) 06/30/2018  . Hyperlipidemia 05/22/2018  . Special screening for malignant neoplasms, colon 01/17/2017  . Class 3  obesity with body mass index (BMI) of 60.0 to 69.9 in adult 11/16/2016  . Hypothyroidism 11/16/2016  . Prediabetes 02/20/2016  . Lymphedema 12/16/2015    Loel Dubonnet, MS, OTR/L, [facility] 12/27/20 11:37 AM  [place] [facility] MAIN [facility] SERVICES [address] [place], [place], [address] Phone: [phone]   Fax:  [phone]  Name: Jennifer Huffman MRN: [id number] Date of Birth: 03-Feb-1951

## 2020-12-29 ENCOUNTER — Encounter: Payer: Medicare Other | Admitting: Occupational Therapy

## 2021-01-03 ENCOUNTER — Encounter: Payer: Medicare Other | Admitting: Occupational Therapy

## 2021-01-05 ENCOUNTER — Encounter: Payer: Medicare Other | Admitting: Occupational Therapy

## 2021-01-06 DIAGNOSIS — J4520 Mild intermittent asthma, uncomplicated: Secondary | ICD-10-CM | POA: Diagnosis not present

## 2021-01-06 DIAGNOSIS — E785 Hyperlipidemia, unspecified: Secondary | ICD-10-CM | POA: Diagnosis not present

## 2021-01-06 DIAGNOSIS — M170 Bilateral primary osteoarthritis of knee: Secondary | ICD-10-CM | POA: Diagnosis not present

## 2021-01-06 DIAGNOSIS — E039 Hypothyroidism, unspecified: Secondary | ICD-10-CM | POA: Diagnosis not present

## 2021-01-06 DIAGNOSIS — I10 Essential (primary) hypertension: Secondary | ICD-10-CM | POA: Diagnosis not present

## 2021-01-10 DIAGNOSIS — R262 Difficulty in walking, not elsewhere classified: Secondary | ICD-10-CM | POA: Diagnosis not present

## 2021-01-10 DIAGNOSIS — R2681 Unsteadiness on feet: Secondary | ICD-10-CM | POA: Diagnosis not present

## 2021-01-10 DIAGNOSIS — M170 Bilateral primary osteoarthritis of knee: Secondary | ICD-10-CM | POA: Diagnosis not present

## 2021-01-10 DIAGNOSIS — R7303 Prediabetes: Secondary | ICD-10-CM | POA: Diagnosis not present

## 2021-01-10 DIAGNOSIS — I890 Lymphedema, not elsewhere classified: Secondary | ICD-10-CM | POA: Diagnosis not present

## 2021-01-12 ENCOUNTER — Encounter: Payer: Medicare Other | Admitting: Occupational Therapy

## 2021-02-13 ENCOUNTER — Ambulatory Visit: Payer: Medicare Other

## 2021-03-01 ENCOUNTER — Ambulatory Visit: Payer: Medicare Other | Attending: Internal Medicine

## 2021-03-01 ENCOUNTER — Other Ambulatory Visit: Payer: Self-pay

## 2021-03-01 DIAGNOSIS — M6281 Muscle weakness (generalized): Secondary | ICD-10-CM | POA: Insufficient documentation

## 2021-03-01 DIAGNOSIS — R2681 Unsteadiness on feet: Secondary | ICD-10-CM | POA: Insufficient documentation

## 2021-03-01 DIAGNOSIS — R2689 Other abnormalities of gait and mobility: Secondary | ICD-10-CM | POA: Insufficient documentation

## 2021-03-01 DIAGNOSIS — M79604 Pain in right leg: Secondary | ICD-10-CM | POA: Insufficient documentation

## 2021-03-01 DIAGNOSIS — M79605 Pain in left leg: Secondary | ICD-10-CM | POA: Insufficient documentation

## 2021-03-01 DIAGNOSIS — I890 Lymphedema, not elsewhere classified: Secondary | ICD-10-CM | POA: Insufficient documentation

## 2021-03-01 DIAGNOSIS — R278 Other lack of coordination: Secondary | ICD-10-CM | POA: Diagnosis not present

## 2021-03-01 NOTE — Therapy (Signed)
[place] MAIN [facility] [address] [place], [place], [address] Phone: [phone]   Fax:  [phone]  Physical Therapy Evaluation  Patient Details  Name: Jennifer Huffman MRN: [id number] Date of Birth: [birth year] Referring Provider (PT): Leeroy Cha, MD   Encounter Date: 03/01/2021   PT End of Session - 03/01/21 1130    Visit Number 1    Number of Visits 1    Date for PT Re-Evaluation 03/01/21    Authorization Type WC eval    PT Start Time 1006    PT Stop Time 1100    PT Time Calculation (min) 54 min    Equipment Utilized During Treatment Gait belt;Other (comment)   pt used her inhaler   Activity Tolerance Patient limited by pain;Other (comment);Patient limited by fatigue   limited by SOB   Behavior During Therapy [facility] for tasks assessed/performed           Past Medical History:  Diagnosis Date  . Asthma, mild intermittent   . Blurred vision, bilateral   . Diabetes mellitus without complication (HCC)    type 2  . Dyspnea    with exertion  . History of kidney stones   . Hyperlipidemia   . Hypertension   . Hypothyroidism   . Lymphedema of both lower extremities    compressions hose when out anf wraps at home  . Neuromuscular disorder ([place])   . Osteoarthritis of both knees     Past Surgical History:  Procedure Laterality Date  . ABDOMINAL HYSTERECTOMY     partial  . COLONOSCOPY WITH PROPOFOL N/A 01/17/2017   Procedure: COLONOSCOPY WITH PROPOFOL;  Surgeon: Wilford Corner, MD;  Location: [facility];  Service: Endoscopy;  Laterality: N/A;  . CYSTOSCOPY/RETROGRADE/URETEROSCOPY/STONE EXTRACTION WITH BASKET Right 06/30/2018   Procedure: CYSTOSCOPY/RETROGRADE/URETEROSCOPY/STONE EXTRACTION WITH BASKET INSERTION DOUBLE J STENT;  Surgeon: Irine Seal, MD;  Location: [facility];  Service: Urology;  Laterality: Right;  . EXTRACORPOREAL SHOCK WAVE LITHOTRIPSY Right 07/31/2018   Procedure: RIGHT EXTRACORPOREAL SHOCK WAVE  LITHOTRIPSY (ESWL);  Surgeon: Raynelle Bring, MD;  Location: [facility];  Service: Urology;  Laterality: Right;  . HOLMIUM LASER APPLICATION Right 3/78/5885   Procedure: HOLMIUM LASER APPLICATION;  Surgeon: Irine Seal, MD;  Location: [facility];  Service: Urology;  Laterality: Right;  . IR ABLATE LIVER CRYOABLATION  02/05/2020  . IR ABLATE LIVER CRYOABLATION  03/11/2020  . IR RADIOLOGIST EVAL & MGMT  01/21/2020  . TUBAL LIGATION    . URETEROSCOPY WITH HOLMIUM LASER LITHOTRIPSY Left 05/07/2018    There were no vitals filed for this visit.  PATIENT INFORMATION: This Evaluation form will serve as the LMN for the following suppliers:  Supplier: [employer] Person: Delton See, [employer] Phone: [phone]   Reason for Referral: evaluation for scooter Patient/caregiver Goals: Pt would like to be able to safely navigate her home and the community without falling/increasing fall risk Patient was seen for face-to-face evaluation for new scooter.  Also present was    Delton See of [place]                     to discuss recommendations and wheelchair options.  Further paperwork was completed and sent to vendor.  Patient appears to qualify for power mobility device at this time per objective findings.    MEDICAL HISTORY:  PMH includes: asthma, DMII, dyspnea with exertion, HLD, HTN, hypothyroidism, lymphedema of B LEs, neuromuscular disorder, OA of B knees. Diagnosis: gait impairment,  B knee OA, lymphedema of BLEs, dyspnea with exertion   Primary Diagnosis Onset: 6-10 years ago '[x]' Progressive Disease Relevant Past and Future Surgeries: none  Height: 5'1 Weight: 315 lbs Explain and recent changes or trends in weight: no  Relevant History including falls: Pt is a pleasant 70 y/o female presenting in transport chair today for wheelchair/scooter evaluation. Vendor, Delton See, from [employer] present. Pt being evaluated for scooter.  The pt reports difficulty with walking. She  states she can only walk for at most 5 minutes before she is too out of breath to continue and before BLE pain also limits her, as pt has BLE lymphedema and B knee OA. Pt reports she only attempts to walk in her home using walker or Hurrycane, but that she feels she has poor balance and is afraid she will fall, and that she must lean heavily on the AD and stops frequently to use her inhaler. She reports someone is always supervising her when she attempts to ambulate in home with RW due to unsteadiness. She does report fall within the last 6 months. She reports she was standing up to walk and fell to the floor, landing on her back. She reports it took her almost an hour to get off the floor. She says, "If I fall I worry I won't be able to get up." Pt reports she brings her inhaler with her everywhere due to asthma and shortness of breath and currently has it with her.  Pt reports onset of mobility issues occurred years ago (6-10 years ago). Pt reports history of skin wound 6 years ago on RLE after being on an airplane. Pt reports she did receive PT following hospitalization for wound. She reports no wounds since, but that she does currently have BLEs wrapped for lymphedema treatments she is receiving in OT currently. Pt does report numbness in her hands at times.    HOME ENVIRONMENT: '[x]' House  '[]' Condo/town home  '[]' Apartment  '[]' Assisted Living    '[]' Lives Alone '[x]'  Lives with Others: pt reports lives with sister and friend                                                    Hours with caregiver:   '[x]' Home is accessible to patient            Stairs  '[x]' Yes '[]'  No     Ramp '[x]' Yes '[]' No Comments: Pt unable to use stairs, but had ramp installed in her home and is able to use ramp to enter her home   COMMUNITY ADL: TRANSPORTATION: '[]' Car    '[x]' Van    '[]' Public Transportation    '[]' Adapted w/c Lift   '[]' Ambulance   '[]' Other:       '[]' Sits in wheelchair during transport  Employment/School:     retired                                                    Other:  Sister has Tourist information centre manager, pt reports she does not currently drive and has help with transportation. She says she was helped into car today via transfer from chair.  FUNCTIONAL/SENSORY PROCESSING SKILLS:  Handedness:   '[x]' Right     '[]' Left    '[]' NA  Comments: pt reports she sometimes has numbness both hands                                Functional Processing Skills for Wheeled Mobility '[x]' Processing Skills are adequate for safe wheelchair operation; pt however with limited endurance and would be inappropriate for manual wheelchair  Areas of concern than may interfere with safe operation of wheelchair Description of problem: Pt endurance significantly limited with SOB. Pt would not be able to safely sustain using UEs to manually propel a wheelchair, both due to shortness of breath, decreased muscular endurance and decreased UE ROM required for manual wc propulsion   '[]'  Attention to environment     '[]' Judgment     '[]'  Hearing  '[]'  Vision or visual processing    '[]' Motor Planning  '[]'  Fluctuations in Behavior                                                   VERBAL COMMUNICATION: '[x]' WFL receptive '[x]'  WFL expressive '[]' Understandable  '[]' Difficult to understand  '[]' non-communicative '[]'  Uses an augmented communication device    CURRENT SEATING / MOBILITY: Current Mobility Base:   '[x]' None  '[]' Dependent  '[]' Manual  '[]' Scooter  '[]' Power   Type of Control:                       Manufacturer:                         Size:                         Age:                           Current Condition of Mobility Base:                                                                                                                     Current Wheelchair components:  Describe posture in present seating system:                                                                             SENSATION and SKIN ISSUES: Sensation '[x]' Intact '[x]' Impaired '[]' Absent   Level of sensation:    Sometimes has numbness in B hands, sensation WNL and intact in BLEs and elsewhere                        Pressure Relief: Able to perform effective pressure relief :   '[x]' Yes  '[]'  No Method:                                                                              If not, Why?:                                                                          Skin Issues/Skin Integrity Current Skin Issues   '[]' Yes '[x]' No  '[x]' Intact '[]'  Red area '[]'  Open Area  '[]' Scar Tissue '[]' At risk from prolonged sitting  Where                              History of Skin Issues   '[x]' Yes '[]' No  Where  R Lower leg                                       When      6 years ago                                  After air travel      Hx of skin flap surgeries '[]' Yes '[x]' No  Where                  When                                                  Limited sitting tolerance '[]' Yes '[x]' No Hours spent sitting in wheelchair daily:  Complaint of Pain:  Please describe: Pt reports B knee pain due to lymphedema and B knee OA that worsens with weight-bearing                                                                                                            Swelling/Edema:   Pt developed lymphedema ten years ago and is currently being seen for it by OT                                                                                                                                                ADL STATUS (in reference to wheelchair use):  Indep Assist Unable Indep with Equip Not assessed Comments  Dressing      Mod I                                                      Has to sit down to get dressed, takes increased time             Eating   x                                                                                                                            Toileting Mod I                                                                   Holds onto sink or walker, sometimes she has to stop because she is  out of breath, takes increased time, must hold onto something, afraid she'll fall                                                           Bathing         Mod I                                                              Uses shower chair, sits down                                   Grooming/ Hygiene   Mod I                                                                  Must sit down to perform                                                       Meal Prep                x                                                        Pt gets out of breath when trying to assist with meal prep                                                 IADLS x                                                                                                                 Bowel Management: '[x]' Continent  '[]' Incontinent  '[]' Accidents Comments:  Bladder Management: '[x]' Continent  '[]' Incontinent  '[]' Accidents Comments:                                              WHEELCHAIR SKILLS: Manual w/c Propulsion: '[]' UE or LE strength and endurance sufficient to participate in ADLs using manual wheelchair Arm :  '[]' left '[]' right  '[]' Both                                   Foot:   '[]' left '[]' right  '[]' Both  Distance:   Operate Scooter: '[x]'  Strength, hand grip, balance and transfer appropriate for use '[x]' Living environment is accessible for use of scooter  Operate Power w/c:  '[]'  Std. Joystick   '[]'  Alternative Controls Indep '[]'  Assist '[]'  Dependent/ Unable '[]'  N/A '[]'  '[]' Safe          '[]'  Functional      Distance:                Bed confined without wheelchair '[]'  Yes '[]'  No   STRENGTH/RANGE OF MOTION:  Range of Motion Strength  Shoulder                       Impaired; achieves only 90  deg flex/abduct B                        4+/5 B                                                             Elbow                  WNL                                                                                            5/5 B  Wrist/Hand                                                      WNL                                                                            4+/5 B      Hip  Impaired; pt stands with crouched posture/increased hip flexion                                                               4-/5 B                                        Knee                      WFL                                                    4-/5 knee flex/ext B and painful on LLE with testing                                                     Ankle Impaired DF d/t body habitus                                                               4/5 B      MOBILITY/BALANCE:  '[]'  Patient is totally dependent for mobility                                                                                               Balance Transfers Ambulation  Sitting Balance: Standing Balance: '[]'  Independent '[]'  Independent/Modified Independent  '[x]'  WFL     '[]'  WFL '[x]'  Supervision with pt holding onto something '[]'  Supervision  '[]'  Uses UE for balance  '[x]'  Supervision with pt holding onto something '[]'  Min Assist '[]'  Ambulates with Assist                           '[]'  Min Assist '[]'  Min assist '[]'  Mod Assist '[]'  Ambulates with Device:  '[x]'  RW  Very unsafe/increased fall risk, heavy UE weightbearing d/t poor LE strength, decreased gait speed (unalbe to finish 10MWT d/t poor endurance, stability and SOB, requiring use of her inhaler 3x).    '[]'  StW   '[]'  Cane   '[]'                 '[]'  Mod Assist '[]'  Mod assist '[]'  Max assist   '[]'  Max Assist '[]'  Max assist '[]'  Dependent '[]'  Indep. Short Distance  Only  '[]'  Unable '[]'  Unable '[]'  Lift / Sling Required Distance (in feet)                             '[]'  Sliding board '[]'  Unable to  Ambulate: (Explain:  Cardio Status:  '[]' Intact  '[x]'  Impaired   '[]'  NA                              Respiratory Status:  '[]' Intact   '[x]' Impaired   '[]' NA                                     Orthotics/Prosthetics:     LEs wrapped due to lymphedenma                                                                    Comments (Address manual vs power w/c vs scooter):      Pt is a pleasant 70 y/o female with chronic knee pain due to B knee OA, BLE lymphedema, asthma, shortness of breath (pt has inhaler on her at all times), and overall significant decrease in gait ability/functional mobility. Per pt report her mobility has been declining over the past several years, and pt is now at a high risk for falls and has fallen within the past 6 months. Pt is unsafe using a RW or SPC for all distances due to poor cardio endurance with frequent instances of shortness breath requiring use of inhaler, poor LE muscular strength and endurance, knee pain, and decreased postural stability in a weight-bearing position. Pt attempted 10MWT with RW but was unable to complete full 10 meters, and instead ambulated 5 meters in 1 minute 32 seconds (indicating severe impairment), requiring 3 standing rest breaks to use her inhaler due to SOB. PT also had to provide close CGA-min assist to support the patient at her RW during test and to assist patient back into chair. Pt had difficulty maintaining upright posture and was weightbearing heavily through BUEs due to decreased LE strengh/endurance. If PT had not provided assistance pt would have likely fallen during test. Pt is unable to functionally propel a manual wheelchair due to poor muscular endurance, insufficient BUE ROM, and shortness of breath with activity. The patient, however, is appropriate for a scooter as she exhibits good seated balance and WNL trunk strength. Pt also has the grip strength and UE ROM that is necessary to operate a scooter. Due to patient's high fall risk and the above  mentioned deficits, she will require a scooter.                                           Anterior / Posterior Obliquity Rotation-Pelvis  PELVIS    '[x]' Neutral  '[]'  Posterior  '[]'  Anterior     '[x]' WFL  '[]' Right Elevated  '[]' Left Elevated   '[x]' WFL  '[]' Right Anterior '[]'   Left Anterior    '[]'  Fixed '[x]'  Partly Flexible '[]'  Flexible  '[]'  Other  '[]'   Fixed  '[]'  Partly Flexible  '[x]'  Flexible '[]'  Other  '[]'  Fixed  '[]'  Partly Flexible  '[x]'  Flexible '[]'  Other  TRUNK '[]' WFL '[x]' Thoracic Kyphosis '[]' Lumbar Lordosis   '[x]'  WFL '[]' Convex Right '[]' Convex Left   '[]' c-curve '[]' s-curve '[]' multiple  '[x]'  Neutral '[]'  Left-anterior '[]'  Right-anterior    '[]'  Fixed '[]'  Flexible '[x]'  Partly Flexible       Other  '[]'  Fixed '[]'  Flexible '[x]'  Partly Flexible '[]'  Other  '[]'  Fixed           '[]'  Flexible '[x]'  Partly Flexible '[]'  Other   Position Windswept   HIPS  '[x]'  Neutral '[]'  Abduct '[]'  ADduct '[x]'  Neutral '[]'  Right '[]'  Left       '[]'  Fixed  '[x]'  Partly Flexible             '[]'  Dislocated '[]'  Flexible '[]'  Subluxed    '[]'  Fixed '[x]'  Partly Flexible  '[]'  Flexible '[]'  Other              Foot Positioning Knee Positioning   Knees and  Feet  '[x]'  WFL '[]' Left '[]' Right '[x]'  WFL '[]' Left '[]' Right   KNEES ROM concerns: ROM concerns:   & Dorsi-Flexed                    '[]' Lt '[]' Rt                                  FEET Plantar Flexed                  '[]' Lt '[]' Rt     Inversion                    '[]' Lt '[]' Rt     Eversion                    '[]' Lt '[]' Rt    HEAD '[x]'  Functional '[x]'  Good Head Control   & '[]'  Flexed         '[]'  Extended '[]'  Adequate Head Control   NECK '[]'  Rotated  Lt  '[]'  Lat Flexed Lt '[]'  Rotated  Rt '[]'  Lat Flexed Rt '[]'  Limited Head Control    '[]'  Cervical Hyperextension '[]'  Absent  Head Control    SHOULDERS ELBOWS WRIST& HAND         Left     Right    Left     Right  U/E '[]' Functional  Left            '[]' Functional  Right           WNL           WNL           '[]' Fisting             '[]' Fisting      '[]' elevated Left '[]' depressed  Left '[]' elevated Right '[]' depressed  Right   Wrist and hands are WNL   '[x]' protracted Left '[]' retracted Left '[x]' protracted Right '[]' retracted Right '[]' subluxed  Left              '[]' subluxed  Right         Goals for Wheelchair Mobility  '[x]'  Independence with mobility in the home with motor related ADLs (MRADLs)  '[x]'  Independence with MRADLs in the community '[]'  Provide dependent mobility  '[]'  Provide recline     '[]' Provide tilt   Goals for Seating system '[]'  Optimize pressure distribution '[]'  Provide support  needed to facilitate function or safety '[]'  Provide corrective forces to assist with maintaining or improving posture '[]'  Accommodate client's posture: current seated postures and positions are not flexible or will not tolerate corrective forces '[]'  Client to be independent with relieving pressure in the wheelchair '[]' Enhance physiological function such as breathing, swallowing, digestion  Simulation ideas/Equipment trials: [employer] Victory 10 3-wheel scooter                                             State why other equipment was unsuccessful:    Pt is a pleasant 70 y/o female with chronic knee pain due to B knee OA, BLE lymphedema, asthma, shortness of breath (pt has inhaler on her at all times), and overall significant decrease in gait ability/functional mobility. Per pt report her mobility has been declining over the past several years, and pt is now at a high risk for falls and has fallen within the past 6 months. Pt is unsafe using a RW or SPC for all distances due to poor cardio endurance with frequent instances of shortness breath requiring use of inhaler, poor LE muscular strength and endurance, knee pain, and decreased postural stability in a weight-bearing position. Pt attempted 10MWT with RW but was unable to complete full 10 meters, and instead ambulated 5 meters in 1 minute 32 seconds (indicating severe impairment), requiring 3 standing rest breaks to  use her inhaler due to SOB. PT also had to provide close CGA-min assist to support the patient at her RW during test and to assist patient back into chair. Pt had difficulty maintaining upright posture and was weightbearing heavily through BUEs due to decreased LE strengh/endurance with walking. If PT had not provided assistance pt would have likely fallen during test. Pt is unable to functionally propel a manual wheelchair due to poor muscular endurance, insufficient BUE ROM for propulsion, and shortness of breath with activity. The patient, however, is appropriate for a scooter as she exhibits good seated balance and WNL trunk strength. Pt also has the necessary grip strength and UE ROM that is required to safely operate a scooter. Due to patient's high fall risk and the above mentioned deficits, she will require a [employer] Victory 10 3-wheel scooter to provide a safe means of transport in her home and in the community so pt can perform MRADLs without risk of falling.      MOBILITY BASE RECOMMENDATIONS and JUSTIFICATION: MOBILITY COMPONENT JUSTIFICATION  Manufacturer:  [employer]         Model:  Victory 10 3-wheel            Size: Width           Seat Depth             '[x]' provide transport from point A to B '[x]' promote Indep mobility  '[x]' is not a safe, functional ambulator '[x]' walker or cane inadequate '[]' non-standard width/depth necessary to accommodate anatomical measurement '[]'                             '[]' Manual Mobility Base '[]' non-functional ambulator    '[]' Scooter/POV  '[x]' can safely operate  '[x]' can safely transfer   '[x]' has adequate trunk stability  '[x]' cannot functionally propel manual w/c  '[]' Power Mobility Base  '[]' non-ambulatory  '[]' cannot functionally propel manual wheelchair  '[]'  cannot functionally and safely operate  scooter/POV '[]' can safely operate and willing to  '[]' Stroller Base '[]' infant/child  '[]' unable to propel manual wheelchair '[]' allows for growth '[]' non-functional  ambulator '[]' non-functional UE '[]' Indep mobility is not a goal at this time  '[]' Tilt  '[]' Forward                   '[]' Backward                  '[]' Powered tilt              '[]' Manual tilt  '[]' change position against gravitational force on head and shoulders  '[]' change position for pressure relief/cannot weight shift '[]' transfers  '[]' management of tone '[]' rest periods '[]' control edema '[]' facilitate postural control  '[]'                                       '[]' Recline  '[]' Power recline on power base '[]' Manual recline on manual base  '[]' accommodate femur to back angle  '[]' bring to full recline for ADL care  '[]' change position for pressure relief/cannot weight shift '[]' rest periods '[]' repositioning for transfers or clothing/diaper /catheter changes '[]' head positioning  '[]' Lighter weight required '[]' self- propulsion  '[]' lifting '[]'                                                 '[]' Heavy Duty required '[]' user weight greater than 250# '[]' extreme tone/ over active movement '[]' broken frame on previous chair '[]'                                     '[]'  Back  '[]'  Angle Adjustable '[]'  Custom molded                           '[]' postural control '[]' control of tone/spasticity '[]' accommodation of range of motion '[]' UE functional control '[]' accommodation for seating system '[]'                                          '[]' provide lateral trunk support '[]' accommodate deformity '[]' provide posterior trunk support '[]' provide lumbar/sacral support '[]' support trunk in midline '[]' Pressure relief over spinal processes  '[]'  Seat Cushion                       '[]' impaired sensation  '[]' decubitus ulcers present '[]' history of pressure ulceration '[]' prevent pelvic extension '[]' low maintenance  '[]' stabilize pelvis  '[]' accommodate obliquity '[]' accommodate multiple deformity '[]' neutralize lower extremity position '[]' increase pressure distribution '[]'                                           '[]'  Pelvic/thigh support  '[]'  Lateral thigh guide '[]'  Distal medial pad   '[]'  Distal lateral pad '[]'  pelvis in neutral '[]' accommodate pelvis '[]'  position upper legs '[]'  alignment '[]'  accommodate ROM '[]'  decrease adduction '[]' accommodate tone '[]' removable for transfers '[]' decrease abduction  '[]'  Lateral trunk Supports '[]'  Lt     '[]'  Rt '[]' decrease lateral trunk leaning '[]' control tone '[]' contour for increased contact '[]' safety  '[]' accommodate asymmetry '[]'                                                 '[]'   Mounting hardware  '[]' lateral trunk supports  '[]' back   '[]' seat '[]' headrest      '[]'  thigh support '[]' fixed   '[]' swing away '[]' attach seat platform/cushion to w/c frame '[]' attach back cushion to w/c frame '[]' mount postural supports '[]' mount headrest  '[]' swing medial thigh support away '[]' swing lateral supports away for transfers  '[]'                                                     Armrests  '[]' fixed '[]' adjustable height '[]' removable   '[]' swing away  '[]' flip back   '[]' reclining '[]' full length pads '[]' desk    '[]' pads tubular  '[]' provide support with elbow at 90   '[]' provide support for w/c tray '[]' change of height/angles for variable activities '[]' remove for transfers '[]' allow to come closer to table top '[]' remove for access to tables '[]'                                               Hangers/ Leg rests  '[]' 60 '[]' 70 '[]' 90 '[]' elevating '[]' heavy duty  '[]' articulating '[]' fixed '[]' lift off '[]' swing away     '[]' power '[]' provide LE support  '[]' accommodate to hamstring tightness '[]' elevate legs during recline   '[]' provide change in position for Legs '[]' Maintain placement of feet on footplate '[]' durability '[]' enable transfers '[]' decrease edema '[]' Accommodate lower leg length '[]'                                         Foot support Footplate    '[]' Lt  '[]'  Rt  '[]'  Center mount '[]' flip up                            '[]' depth/angle adjustable '[]' Amputee adapter    '[]'  Lt     '[]'  Rt '[]' provide foot support '[]' accommodate to ankle ROM '[]' transfers '[]' Provide support for residual extremity '[]'  allow foot to go under wheelchair  base '[]'  decrease tone  '[]'                                                 '[]'  Ankle strap/heel loops '[]' support foot on foot support '[]' decrease extraneous movement '[]' provide input to heel  '[]' protect foot  Tires: '[]' pneumatic  '[]' flat free inserts  '[]' solid  '[]' decrease maintenance  '[]' prevent frequent flats '[]' increase shock absorbency '[]' decrease pain from road shock '[]' decrease spasms from road shock '[]'                                              '[]'  Headrest  '[]' provide posterior head support '[]' provide posterior neck support '[]' provide lateral head support '[]' provide anterior head support '[]' support during tilt and recline '[]' improve feeding   '[]' improve respiration '[]' placement of switches '[]' safety  '[]' accommodate ROM  '[]' accommodate tone '[]' improve visual orientation  '[]'  Anterior chest strap '[]'  Vest '[]'  Shoulder retractors  '[]' decrease forward movement of shoulder '[]' accommodation of TLSO '[]' decrease forward movement  of trunk '[]' decrease shoulder elevation '[]' added abdominal support '[]' alignment '[]' assistance with shoulder control  '[]'                                               Pelvic Positioner '[]' Belt '[]' SubASIS bar '[]' Dual Pull '[]' stabilize tone '[]' decrease falling out of chair/ **will not Decrease potential for sliding due to pelvic tilting '[]' prevent excessive rotation '[]' pad for protection over boney prominence '[]' prominence comfort '[]' special pull angle to control rotation '[]'                                                  Upper ExtremitySupport  '[]' L   '[]'  R '[]' Arm trough   '[]' hand support '[]'  tray       '[]' full tray '[]' swivel mount '[]' decrease edema      '[]' decrease subluxation   '[]' control tone   '[]' placement for AAC/Computer/EADL '[]' decrease gravitational pull on shoulders '[]' provide midline positioning '[]' provide support to increase UE function '[]' provide hand support in natural position '[]' provide work surface   POWER WHEELCHAIR CONTROLS  '[]' Proportional  '[]' Non-Proportional Type                                       '[]' Left  '[]' Right '[]' provides access for controlling wheelchair   '[]' lacks motor control to operate proportional drive control '[]' unable to understand proportional controls  Actuator Control Module  '[]' Single  '[]' Multiple   '[]' Allow the client to operate the power seat function(s) through the joystick control   '[]' Safety Reset Switches '[]' Used to change modes and stop the wheelchair when driving in latch mode    '[]' [employer]   '[]' programming for accurate control '[]' progressive Disease/changing condition '[]' non-proportional drive control needed '[]' Needed in order to operate power seat functions through joystick control   '[]' Display box '[]' Allows user to see in which mode and drive the wheelchair is set  '[]' necessary for alternate controls    '[]' Digital interface electronics '[]' Allows w/c to operate when using alternative drive controls  '[]' ASL Head Array '[]' Allows client to operate wheelchair  through switches placed in tri-panel headrest  '[]' Sip and puff with tubing kit '[]' needed to operate sip and puff drive controls  '[]' Upgraded tracking electronics '[]' increase safety when driving '[]' correct tracking when on uneven surfaces  '[]' Mount for switches or joystick '[]' Attaches switches to w/c  '[]' Swing away for access or transfers '[]' midline for optimal placement '[]' provides for consistent access  '[]' Attendant controlled joystick plus mount '[]' safety '[]' long distance driving '[]' operation of seat functions '[]' compliance with transportation regulations '[]'                                             Rear wheel placement/Axle adjustability '[]' None '[]' semi adjustable '[]' fully adjustable  '[]' improved UE access to wheels '[]' improved stability '[]' changing angle in space for improvement of postural stability '[]' 1-arm drive access '[]' amputee pad placement '[]'                                Wheel rims/ hand rims  '[]' metal   '[]' plastic coated '[]'   oblique projections           '[]' vertical projections '[]' Provide  ability to propel manual wheelchair  '[]'  Increase self-propulsion with hand weakness/decreased grasp  Push handles '[]' extended   '[]' angle adjustable              '[]' standard '[]' caregiver access '[]' caregiver assist '[]' allows "hooking" to enable increased ability to perform ADLs or maintain balance  One armed device   '[]' Lt   '[]' Rt '[]' enable propulsion of manual wheelchair with one arm   '[]'                                            Brake/wheel lock extension '[]'  Lt   '[]'  Rt '[]' increase indep in applying wheel locks   '[]' Side guards '[]' prevent clothing getting caught in wheel or becoming soiled '[]'  prevent skin tears/abrasions  Battery:                                            '[]' to power wheelchair                                                         Other:                                                                                                                        The above equipment has a life- long use expectancy. Growth and changes in medical and/or functional conditions would be the exceptions. This is to certify that the therapist has no financial relationship with durable medical provider or manufacturer. The therapist will not receive remuneration of any kind for the equipment recommended in this evaluation.   Patient has mobility limitation that significantly impairs safe, timely participation in one or more mobility related ADL's. (bathing, toileting, feeding, dressing, grooming, moving from room to room)  '[x]'  Yes '[]'  No  Will mobility device sufficiently improve ability to participate and/or be aided in participation of MRADL's?      '[x]'  Yes '[]'  No  Can limitation be compensated for with use of a cane or walker?                                    '[]'  Yes '[x]'  No very unsafe, pt is high fall risk  Does patient or caregiver demonstrate ability/potential ability & willingness to safely use the mobility device?    '[x]'  Yes '[]'  No  Does patient's home environment support use of recommended  mobility device?            [  x] Yes '[]'  No  Does patient have sufficient upper extremity function necessary to functionally propel a manual wheelchair?     '[]'  Yes '[x]'  No limited by endurance, SOB with exertion, insufficient UE ROM necessary for manual wheelchair propulsion  Does patient have sufficient strength and trunk stability to safely operate a POV (scooter)?                                  '[x]'  Yes '[]'  No  Does patient need additional features/benefits provided by a power wheelchair for MRADL's in the home?        '[]'  Yes '[x]'  No  Does the patient demonstrate the ability to safely use a power wheelchair?                   '[x]'  Yes '[]'  No     Physician's Name Printed:                                                        Physician's Signature:  Date:     This is to certify that I, the above signed therapist have the following affiliations: '[]'  This DME provider '[]'  Manufacturer of recommended equipment '[]'  Patient's long term care facility '[x]'  None of the above  Therapist Name/Signature:  Ricard Dillon PT, DPT                                          Date: 03/01/2021     Subjective Assessment - 03/01/21 1002    Subjective Pt is a pleasant 70 y/o female presenting in transport chair today for wheelchair/scooter evaluation. Vendor, Delton See, from [employer] present. Pt being evaluated for scooter.  The pt reports difficulty with walking. She states she can only walk for at most 5 minutes before she is too out of breath to continue and before BLE pain also limits her, as pt has BLE lymphedema and B knee OA. Pt reports she only attempts to walk in her home using walker or Hurrycane, but that she feels she has poor balance and is afraid she will fall, and that she must lean heavily on the AD and stops frequently to use her inhaler. She reports someone is always supervising her when she attempts to ambulate in home with RW due to unsteadiness. She does report fall within the last 6 months.  She reports she was standing up to walk and fell to the floor, landing on her back. She reports it took her almost an hour to get off the floor. She says, "If I fall I worry I won't be able to get up." Pt reports she brings her inhaler with her everywhere due to asthma and shortness of breath and currently has it with her.  Pt reports onset of mobility issues occurred years ago (6-10 years ago). Pt reports history of skin wound 6 years ago on RLE after being on an airplane. Pt reports she did receive PT following hospitalization for wound. She reports no wounds since, but that she does currently have BLEs wrapped for lymphedema treatments she is receiving in OT currently. Pt does report numbness in her hands  at times.    Pertinent History PMH includes: asthma, blurred vision (bilateral), had cataracts treated, DMII, dyspnea with exertion, HLD, HTN, hypothyroidism, lymphedema of B LEs, neuromuscular disorder, OA of B knees.    Limitations Lifting;Standing;Walking;House hold activities   does not drive   How long can you sit comfortably? not limited    How long can you stand comfortably? no more than 5 minutes, has leg pain    How long can you walk comfortably? 5 mins before pt limited/increasingly unsteady due to BLE pain and shortness of breath    Diagnostic tests no recent diagnostic tests    Patient Stated Goals Pt wants to be able to safely navigate her home and the community    Currently in Pain? No/denies   no pain at rest with sitting, but pt does experience BLE pain in weightbearing and with activity             [facility] PT Assessment - 03/01/21 1107      Assessment   Medical Diagnosis OA, lymphedema, SOB    Referring Provider (PT) Leeroy Cha, MD    Onset Date/Surgical Date --   10 years ago   Hand Dominance Right    Prior Therapy yes   currently seen by OT for lymphedema treatment, prior PT following skin wound     Precautions   Precautions Fall      Restrictions   Weight  Bearing Restrictions No      Balance Screen   Has the patient fallen in the past 6 months Yes    How many times? 1    Has the patient had a decrease in activity level because of a fear of falling?  Yes    Is the patient reluctant to leave their home because of a fear of falling?  Yes      [place] residence    Living Arrangements Other relatives    Available Help at Discharge Family;Friend(s)    Type of [place] - single point;Walker - 2 wheels;Shower seat;Grab bars - toilet;Grab bars - tub/shower      Prior Function   Level of Independence Needs assistance with ADLs    Vocation Retired      Associate Professor   Overall Cognitive Status Within Functional Limits for tasks assessed                      Objective measurements completed on examination: See above findings.               PT Education - 03/01/21 2155    Education Details Pt educated on findings of eval, recommendations    Person(s) Educated Patient    Methods Explanation    Comprehension Verbalized understanding            PT Short Term Goals - 03/01/21 2156      PT SHORT TERM GOAL #1   Title Pt will understand PT recommendation and appropriate/safe use of scooter for home use    Baseline 6/1: given and met    Time 1    Period Days    Status Achieved    Target Date 03/01/21             PT Long Term Goals - 03/01/21 2158      PT LONG TERM GOAL #1  Title Pt will understand PT recommendation and appropriate/safe use of scooter for home use    Baseline 6/1: given and met    Time 1    Period Days    Status Achieved    Target Date 03/01/21                   Patient will benefit from skilled therapeutic intervention in order to improve the following deficits and impairments:     Visit Diagnosis: Unsteadiness on feet  Other abnormalities of gait and  mobility  Muscle weakness (generalized)  Other lack of coordination  Lymphedema, not elsewhere classified  Pain in left leg  Pain in right leg     Problem List Patient Active Problem List   Diagnosis Date Noted  . Kidney stone 06/30/2018  . Right ureteral stone 06/30/2018  . AKI (acute kidney injury) ([place]) 06/30/2018  . Hyperlipidemia 05/22/2018  . Special screening for malignant neoplasms, colon 01/17/2017  . Class 3 obesity with body mass index (BMI) of 60.0 to 69.9 in adult 11/16/2016  . Hypothyroidism 11/16/2016  . Prediabetes 02/20/2016  . Lymphedema 12/16/2015   Ricard Dillon PT, DPT 03/01/2021, 9:59 PM  [place] [facility] SERVICES [address] [place], [place], [address] Phone: [phone]   Fax:  [phone]  Name: Jennifer Huffman MRN: [id number] Date of Birth: 1951/06/30

## 2021-03-29 DIAGNOSIS — N200 Calculus of kidney: Secondary | ICD-10-CM | POA: Diagnosis not present

## 2021-04-05 DIAGNOSIS — E785 Hyperlipidemia, unspecified: Secondary | ICD-10-CM | POA: Diagnosis not present

## 2021-04-05 DIAGNOSIS — E039 Hypothyroidism, unspecified: Secondary | ICD-10-CM | POA: Diagnosis not present

## 2021-04-05 DIAGNOSIS — E1169 Type 2 diabetes mellitus with other specified complication: Secondary | ICD-10-CM | POA: Diagnosis not present

## 2021-04-05 DIAGNOSIS — J3489 Other specified disorders of nose and nasal sinuses: Secondary | ICD-10-CM | POA: Diagnosis not present

## 2021-04-05 DIAGNOSIS — J4520 Mild intermittent asthma, uncomplicated: Secondary | ICD-10-CM | POA: Diagnosis not present

## 2021-04-05 DIAGNOSIS — I10 Essential (primary) hypertension: Secondary | ICD-10-CM | POA: Diagnosis not present

## 2021-04-05 DIAGNOSIS — K219 Gastro-esophageal reflux disease without esophagitis: Secondary | ICD-10-CM | POA: Diagnosis not present

## 2021-04-05 DIAGNOSIS — M170 Bilateral primary osteoarthritis of knee: Secondary | ICD-10-CM | POA: Diagnosis not present

## 2021-04-06 DIAGNOSIS — N200 Calculus of kidney: Secondary | ICD-10-CM | POA: Diagnosis not present

## 2021-04-06 DIAGNOSIS — K5730 Diverticulosis of large intestine without perforation or abscess without bleeding: Secondary | ICD-10-CM | POA: Diagnosis not present

## 2021-04-10 DIAGNOSIS — Z7189 Other specified counseling: Secondary | ICD-10-CM | POA: Diagnosis not present

## 2021-04-10 DIAGNOSIS — N200 Calculus of kidney: Secondary | ICD-10-CM | POA: Diagnosis not present

## 2021-04-10 DIAGNOSIS — Z90710 Acquired absence of both cervix and uterus: Secondary | ICD-10-CM | POA: Diagnosis not present

## 2021-04-10 DIAGNOSIS — I10 Essential (primary) hypertension: Secondary | ICD-10-CM | POA: Diagnosis not present

## 2021-04-10 DIAGNOSIS — R7303 Prediabetes: Secondary | ICD-10-CM | POA: Diagnosis not present

## 2021-04-10 DIAGNOSIS — R7309 Other abnormal glucose: Secondary | ICD-10-CM | POA: Diagnosis not present

## 2021-04-10 DIAGNOSIS — Z1159 Encounter for screening for other viral diseases: Secondary | ICD-10-CM | POA: Diagnosis not present

## 2021-04-10 DIAGNOSIS — J4520 Mild intermittent asthma, uncomplicated: Secondary | ICD-10-CM | POA: Diagnosis not present

## 2021-04-10 DIAGNOSIS — I890 Lymphedema, not elsewhere classified: Secondary | ICD-10-CM | POA: Diagnosis not present

## 2021-04-10 DIAGNOSIS — Z1389 Encounter for screening for other disorder: Secondary | ICD-10-CM | POA: Diagnosis not present

## 2021-04-10 DIAGNOSIS — Z0000 Encounter for general adult medical examination without abnormal findings: Secondary | ICD-10-CM | POA: Diagnosis not present

## 2021-04-10 DIAGNOSIS — E785 Hyperlipidemia, unspecified: Secondary | ICD-10-CM | POA: Diagnosis not present

## 2021-04-10 DIAGNOSIS — R2681 Unsteadiness on feet: Secondary | ICD-10-CM | POA: Diagnosis not present

## 2021-04-25 DIAGNOSIS — L309 Dermatitis, unspecified: Secondary | ICD-10-CM | POA: Diagnosis not present

## 2021-05-02 DIAGNOSIS — Z23 Encounter for immunization: Secondary | ICD-10-CM | POA: Diagnosis not present

## 2021-05-09 DIAGNOSIS — G5601 Carpal tunnel syndrome, right upper limb: Secondary | ICD-10-CM | POA: Diagnosis not present

## 2021-06-01 ENCOUNTER — Ambulatory Visit: Payer: Medicare Other | Admitting: Internal Medicine

## 2021-06-12 DIAGNOSIS — L818 Other specified disorders of pigmentation: Secondary | ICD-10-CM | POA: Diagnosis not present

## 2021-06-12 DIAGNOSIS — L304 Erythema intertrigo: Secondary | ICD-10-CM | POA: Diagnosis not present

## 2021-06-12 DIAGNOSIS — L905 Scar conditions and fibrosis of skin: Secondary | ICD-10-CM | POA: Diagnosis not present

## 2021-07-19 ENCOUNTER — Other Ambulatory Visit: Payer: Self-pay

## 2021-07-19 ENCOUNTER — Ambulatory Visit (INDEPENDENT_AMBULATORY_CARE_PROVIDER_SITE_OTHER): Payer: Medicare Other | Admitting: Internal Medicine

## 2021-07-19 ENCOUNTER — Encounter: Payer: Self-pay | Admitting: Internal Medicine

## 2021-07-19 VITALS — BP 128/88 | HR 73 | Ht 61.0 in | Wt 328.6 lb

## 2021-07-19 DIAGNOSIS — E119 Type 2 diabetes mellitus without complications: Secondary | ICD-10-CM | POA: Diagnosis not present

## 2021-07-19 DIAGNOSIS — R7303 Prediabetes: Secondary | ICD-10-CM

## 2021-07-19 DIAGNOSIS — E039 Hypothyroidism, unspecified: Secondary | ICD-10-CM | POA: Diagnosis not present

## 2021-07-19 DIAGNOSIS — E785 Hyperlipidemia, unspecified: Secondary | ICD-10-CM | POA: Diagnosis not present

## 2021-07-19 LAB — POCT GLYCOSYLATED HEMOGLOBIN (HGB A1C): Hemoglobin A1C: 6.7 % — AB (ref 4.0–5.6)

## 2021-07-19 LAB — T4, FREE: Free T4: 0.93 ng/dL (ref 0.60–1.60)

## 2021-07-19 LAB — TSH: TSH: 3.48 u[IU]/mL (ref 0.35–5.50)

## 2021-07-19 MED ORDER — METFORMIN HCL 500 MG PO TABS: 500.0000 mg | ORAL_TABLET | Freq: Two times a day (BID) | ORAL | 3 refills | Status: DC

## 2021-07-19 NOTE — Patient Instructions (Addendum)
Please continue: - Metformin 500 mg 2x a day with meals.  Try to check sugars every day or every other day.  Look up "portfolio diet".  Continue levothyroxine 150 mcg daily.  Take the thyroid hormone every day, with water, at least 30 minutes before breakfast, separated by at least 4 hours from: - acid reflux medications - calcium - iron - multivitamins  Please stop at the lab.  Please come back for a follow-up appointment in 4 months.

## 2021-07-19 NOTE — Progress Notes (Signed)
Patient ID: Jennifer Huffman, female   DOB: 1950-11-13, 70 y.o.   MRN: [id number]  This visit occurred during the SARS-CoV-2 public health emergency.  Safety protocols were in place, including screening questions prior to the visit, additional usage of staff PPE, and extensive cleaning of exam room while observing appropriate contact time as indicated for disinfecting solutions.   HPI: Jennifer Huffman is a 70 y.o.-year-old female, returning for f/u for prediabetes, dx in ~2014, controlled uncontrolled, without complications. She moved from [place] in 08/2015. Last visit 7 months ago.  Interim history: No increased urination, blurry vision, nausea, chest pain. She continues to have leg swelling - B legs wrapped.  Prediabetes:  Reviewed HbA1c levels: 04/10/2021: HbA1c 6.5% Lab Results  Component Value Date   HGBA1C 6.1 (A) 11/29/2020   HGBA1C 6.2 (A) 05/26/2020   HGBA1C 6.2 (A) 11/24/2019  05/22/2018: HbA1c 5.8% 10/31/2015: HbA1c 6.5%  Pt is on: - Metformin 500 mg 2x a day started 08/2017.  Denies GI side effects.  She continues to not check sugars at home, despite multiple promptings. She tried a freestyle libre CGM in the past but this irritated her skin so she stopped it.  Glucometer: Contour Next  -No CKD, last BUN/creatinine:  04/10/2021: 88/0.94 Lab Results  Component Value Date   BUN 17 05/26/2020   Lab Results  Component Value Date   CREATININE 0.95 05/26/2020  04/07/2019: 16/1.03 05/08/2018: 16.4/1.10, ACR 1.0 07/03/2016: 11/0.83, glucose 98 10/31/2015: 16/0.87, Glucose 113 On telmisartan.  -+ HL; latest lipids: 04/10/2021: 198/155/45/126 Lab Results  Component Value Date   CHOL 172 05/26/2020   HDL 50.30 05/26/2020   LDLCALC 98 05/26/2020   TRIG 118.0 05/26/2020   CHOLHDL 3 05/26/2020  04/07/2019: 191/119/55/112 04/01/2018: 195/105/60/114 10/31/2015: 175/82/50/104 On atorvastatin 80.  -Latest eye exam 2022: reportedly No DR; she had cataract  surgeries in 02 in 11/2018.  -no numbness and tingling in her feet.  On ASA 81.  She has a history of HTN, lymphedema, OA of her knees, mild intermittent asthma, eczema.   Hypothyroidism:  Reviewed her TFTs: Lab Results  Component Value Date   TSH 3.16 05/26/2020   TSH 3.08 05/26/2019   TSH 3.08 07/04/2017   TSH 1.27 12/21/2016  04/01/2018: TSH 3.80 03/06/2016: TSH 4.74 10/31/2015: TSH 4.36 (0.34-4.5)  Pt is on levothyroxine 150 mcg daily, taken: - in am - fasting - at least 30 min from b'fast - no calcium - no iron - + multivitamins later in the day - no PPIs - not on Biotin  She had a kidney stone removed - 05/08/2018.   Stopped Lasix >> now on Chlorthalidone.  ROS: + See HPI, + joint aches  I reviewed pt's medications, allergies, PMH, social hx, family hx, and changes were documented in the history of present illness. Otherwise, unchanged from my initial visit note.  Past Medical History:  Diagnosis Date   Asthma, mild intermittent    Blurred vision, bilateral    Diabetes mellitus without complication (HCC)    type 2   Dyspnea    with exertion   History of kidney stones    Hyperlipidemia    Hypertension    Hypothyroidism    Lymphedema of both lower extremities    compressions hose when out anf wraps at home   Neuromuscular disorder (HCC)    Osteoarthritis of both knees    Past Surgical History:  Procedure Laterality Date   ABDOMINAL HYSTERECTOMY     partial   COLONOSCOPY WITH PROPOFOL N/A 01/17/2017  Procedure: COLONOSCOPY WITH PROPOFOL;  Surgeon: Charlott Rakes, MD;  Location: [facility];  Service: Endoscopy;  Laterality: N/A;   CYSTOSCOPY/RETROGRADE/URETEROSCOPY/STONE EXTRACTION WITH BASKET Right 06/30/2018   Procedure: CYSTOSCOPY/RETROGRADE/URETEROSCOPY/STONE EXTRACTION WITH BASKET INSERTION DOUBLE J STENT;  Surgeon: Bjorn Pippin, MD;  Location: [facility];  Service: Urology;  Laterality: Right;   EXTRACORPOREAL SHOCK WAVE LITHOTRIPSY Right  07/31/2018   Procedure: RIGHT EXTRACORPOREAL SHOCK WAVE LITHOTRIPSY (ESWL);  Surgeon: Heloise Purpura, MD;  Location: [facility];  Service: Urology;  Laterality: Right;   HOLMIUM LASER APPLICATION Right 06/30/2018   Procedure: HOLMIUM LASER APPLICATION;  Surgeon: Bjorn Pippin, MD;  Location: [facility];  Service: Urology;  Laterality: Right;   IR ABLATE LIVER CRYOABLATION  02/05/2020   IR ABLATE LIVER CRYOABLATION  03/11/2020   IR RADIOLOGIST EVAL & MGMT  01/21/2020   TUBAL LIGATION     URETEROSCOPY WITH HOLMIUM LASER LITHOTRIPSY Left 05/07/2018   Social History   Social History   Marital Status: Legally Separated    Spouse Name: N/A   Number of Children: 2   Occupational History   retired   Social History Main Topics   Smoking status: Never Smoker    Smokeless tobacco: Never Used   Alcohol Use: Vodka 1-2 drinks socially   Drug Use: No   Current Outpatient Medications on File Prior to Visit  Medication Sig Dispense Refill   albuterol (PROVENTIL HFA;VENTOLIN HFA) 108 (90 Base) MCG/ACT inhaler Inhale 2 puffs into the lungs every 4 (four) hours as needed for wheezing or shortness of breath.     amLODipine (NORVASC) 5 MG tablet Take 5 mg by mouth daily.     aspirin 81 MG tablet Take 81 mg by mouth daily.     atorvastatin (LIPITOR) 80 MG tablet Take 1 tablet (80 mg total) by mouth daily. 90 tablet 3   BAYER MICROLET LANCETS lancets Use as instructed used to check one time a day 100 each 5   Fluticasone-Salmeterol (ADVAIR) 250-50 MCG/DOSE AEPB Inhale 1 puff into the lungs 2 (two) times daily.      gabapentin (NEURONTIN) 300 MG capsule TAKE 1 CAPSULE BY MOUTH TWICE A DAY 60 capsule 0   glucose blood (BAYER CONTOUR NEXT TEST) test strip Use as instructed testing one time a day. 100 each 5   HYDROcodone-acetaminophen (NORCO) 5-325 MG tablet Take 1 tablet by mouth every 6 (six) hours as needed for moderate pain. 15 tablet 0   levothyroxine (SYNTHROID) 150 MCG tablet Take 1 tablet (150 mcg total) by mouth  daily before breakfast. 90 tablet 3   metFORMIN (GLUCOPHAGE) 500 MG tablet Take 1 tablet (500 mg total) by mouth 2 (two) times daily with a meal. 180 tablet 3   Multiple Vitamins-Minerals (MULTIVITAMIN WITH MINERALS) tablet Take 1 tablet by mouth daily.     potassium chloride (KLOR-CON) 10 MEQ tablet Take 20 mEq by mouth daily.      tamsulosin (FLOMAX) 0.4 MG CAPS capsule Take 0.4 mg by mouth daily.     telmisartan (MICARDIS) 80 MG tablet Take 80 mg by mouth daily.     traMADol (ULTRAM) 50 MG tablet Take 1 tablet (50 mg total) by mouth every 6 (six) hours as needed. 30 tablet 0   No current facility-administered medications on file prior to visit.   No Known Allergies Family History  Problem Relation Age of Onset   Cancer Mother        breast   Breast cancer Mother 80   Hypertension Father    Breast  cancer Sister 40   Breast cancer Sister 59   PE: BP 128/88 (BP Location: Right Wrist, Patient Position: Sitting, Cuff Size: Normal)   Pulse 73   Ht 5\' 1"  (1.549 m)   Wt (!) 328 lb 9.6 oz (149.1 kg)   SpO2 96%   BMI 62.09 kg/m  Wt Readings from Last 3 Encounters:  07/19/21 (!) 328 lb 9.6 oz (149.1 kg)  11/29/20 (!) 325 lb 3.2 oz (147.5 kg)  05/26/20 (!) 322 lb (146.1 kg)   Constitutional: obese, in NAD, walks with a walker Eyes: PERRLA, EOMI, no exophthalmos ENT: moist mucous membranes, no thyromegaly, no cervical lymphadenopathy Cardiovascular: RRR, No MRG + bilateral significant lymphedema in LE R>L Respiratory: CTA B Gastrointestinal: abdomen soft, NT, ND, BS+ Musculoskeletal: no deformities, strength intact in all 4 Skin: moist, warm, no rashes Neurological: + tremor with outstretched hands, DTR normal in all 4  ASSESSMENT: 1. Prediabetes  2. Hypothyroidism  3.  HL  PLAN:  1. Patient with history of prediabetes, on low-dose metformin twice a day, which appears to be working well for her.  She has no GI side effects from metformin -Despite repeated advice in the past,  she is not checking blood sugars at all. -Her HbA1c at last visit was lower, at 6.1%.  We did not change the regimen at that time.  But, in 03/2021, she had another HbA1c checked by PCP and this was higher, at 6.5%. - at this visit, she is still not checking sugars - discussed that this is mandatory now that her HbA1c increased (even higher today-see below). She agrees with this and mentions she does have testing supplies at home - I suggested to:  Patient Instructions  Please continue: - Metformin 500 mg 2x a day with meals.  Try to check sugars every day or every other day.  Look up "portfolio diet".  Continue levothyroxine 150 mcg daily.  Take the thyroid hormone every day, with water, at least 30 minutes before breakfast, separated by at least 4 hours from: - acid reflux medications - calcium - iron - multivitamins  Please stop at the lab.  Please come back for a follow-up appointment in 4 months.  - we checked her HbA1c: 6.7% (higher) >> giving her a diagnosis of diabetes - advised to check sugars at different times of the day - 1x a day, rotating check times - advised for yearly eye exams >> she is UTD - return to clinic in 4 months  2. Hypothyroidism - latest thyroid labs reviewed with pt. >> normal: Lab Results  Component Value Date   TSH 3.16 05/26/2020  - she continues on LT4 150 mcg daily - pt feels good on this dose. - we discussed about taking the thyroid hormone every day, with water, >30 minutes before breakfast, separated by >4 hours from acid reflux medications, calcium, iron, multivitamins. Pt. is taking it correctly. - will check thyroid tests today: TSH and fT4 - If labs are abnormal, she will need to return for repeat TFTs in 1.5 months  3.  HL -Reviewed latest lipid panel from 03/2021: LDL increased, now 126, previously 98.  The rest of the fractions were at goal. -Continues atorvastatin 80 mg daily without side effects -at this visit, I recommended to  improve diet and I recommended the "portfolio diet" -will recheck her Lipid panel at next OV  Component     Latest Ref Rng & Units 07/19/2021  TSH     0.35 - 5.50 uIU/mL  3.48  T4,Free(Direct)     0.60 - 1.60 ng/dL 2.44  Normal TFTs  Carlus Pavlov, MD PhD [facility] Endocrinology

## 2021-08-07 DIAGNOSIS — E039 Hypothyroidism, unspecified: Secondary | ICD-10-CM | POA: Diagnosis not present

## 2021-08-07 DIAGNOSIS — I10 Essential (primary) hypertension: Secondary | ICD-10-CM | POA: Diagnosis not present

## 2021-08-07 DIAGNOSIS — M170 Bilateral primary osteoarthritis of knee: Secondary | ICD-10-CM | POA: Diagnosis not present

## 2021-08-07 DIAGNOSIS — E785 Hyperlipidemia, unspecified: Secondary | ICD-10-CM | POA: Diagnosis not present

## 2021-08-07 DIAGNOSIS — J4520 Mild intermittent asthma, uncomplicated: Secondary | ICD-10-CM | POA: Diagnosis not present

## 2021-08-07 DIAGNOSIS — E1169 Type 2 diabetes mellitus with other specified complication: Secondary | ICD-10-CM | POA: Diagnosis not present

## 2021-08-14 ENCOUNTER — Other Ambulatory Visit: Payer: Self-pay | Admitting: Internal Medicine

## 2021-08-14 DIAGNOSIS — Z1231 Encounter for screening mammogram for malignant neoplasm of breast: Secondary | ICD-10-CM

## 2021-08-18 ENCOUNTER — Ambulatory Visit
Admission: RE | Admit: 2021-08-18 | Discharge: 2021-08-18 | Disposition: A | Discharge status: Home / Self Care | Payer: Medicare Other | Source: Ambulatory Visit | Attending: Internal Medicine | Admitting: Internal Medicine

## 2021-08-18 ENCOUNTER — Other Ambulatory Visit: Payer: Self-pay

## 2021-08-18 DIAGNOSIS — Z1231 Encounter for screening mammogram for malignant neoplasm of breast: Secondary | ICD-10-CM

## 2021-09-14 DIAGNOSIS — E1169 Type 2 diabetes mellitus with other specified complication: Secondary | ICD-10-CM | POA: Diagnosis not present

## 2021-09-14 DIAGNOSIS — J4520 Mild intermittent asthma, uncomplicated: Secondary | ICD-10-CM | POA: Diagnosis not present

## 2021-09-14 DIAGNOSIS — M170 Bilateral primary osteoarthritis of knee: Secondary | ICD-10-CM | POA: Diagnosis not present

## 2021-09-14 DIAGNOSIS — E039 Hypothyroidism, unspecified: Secondary | ICD-10-CM | POA: Diagnosis not present

## 2021-09-14 DIAGNOSIS — I10 Essential (primary) hypertension: Secondary | ICD-10-CM | POA: Diagnosis not present

## 2021-09-14 DIAGNOSIS — E785 Hyperlipidemia, unspecified: Secondary | ICD-10-CM | POA: Diagnosis not present

## 2021-10-13 DIAGNOSIS — E785 Hyperlipidemia, unspecified: Secondary | ICD-10-CM | POA: Diagnosis not present

## 2021-10-13 DIAGNOSIS — E039 Hypothyroidism, unspecified: Secondary | ICD-10-CM | POA: Diagnosis not present

## 2021-10-13 DIAGNOSIS — I10 Essential (primary) hypertension: Secondary | ICD-10-CM | POA: Diagnosis not present

## 2021-10-13 DIAGNOSIS — E1169 Type 2 diabetes mellitus with other specified complication: Secondary | ICD-10-CM | POA: Diagnosis not present

## 2021-10-13 DIAGNOSIS — Z7984 Long term (current) use of oral hypoglycemic drugs: Secondary | ICD-10-CM | POA: Diagnosis not present

## 2021-10-13 DIAGNOSIS — I890 Lymphedema, not elsewhere classified: Secondary | ICD-10-CM | POA: Diagnosis not present

## 2021-11-20 ENCOUNTER — Ambulatory Visit (INDEPENDENT_AMBULATORY_CARE_PROVIDER_SITE_OTHER): Payer: Medicare Other | Admitting: Internal Medicine

## 2021-11-20 ENCOUNTER — Other Ambulatory Visit: Payer: Self-pay

## 2021-11-20 ENCOUNTER — Encounter: Payer: Self-pay | Admitting: Internal Medicine

## 2021-11-20 VITALS — BP 130/82 | HR 72 | Ht 61.0 in | Wt 323.6 lb

## 2021-11-20 DIAGNOSIS — E039 Hypothyroidism, unspecified: Secondary | ICD-10-CM

## 2021-11-20 DIAGNOSIS — E785 Hyperlipidemia, unspecified: Secondary | ICD-10-CM | POA: Diagnosis not present

## 2021-11-20 DIAGNOSIS — E119 Type 2 diabetes mellitus without complications: Secondary | ICD-10-CM | POA: Diagnosis not present

## 2021-11-20 LAB — POCT GLYCOSYLATED HEMOGLOBIN (HGB A1C): Hemoglobin A1C: 6.2 % — AB (ref 4.0–5.6)

## 2021-11-20 NOTE — Progress Notes (Signed)
Patient ID: Jennifer Huffman, female   DOB: [birth year], 71 y.o.   MRN: [id number]  This visit occurred during the SARS-CoV-2 public health emergency.  Safety protocols were in place, including screening questions prior to the visit, additional usage of staff PPE, and extensive cleaning of exam room while observing appropriate contact time as indicated for disinfecting solutions.   HPI: Jennifer Huffman is a 71 y.o.-year-old female, returning for f/u for DM2, dx in 07/2021 (previously prediabetes dx in 2014), controlled uncontrolled, without complications. She moved from [place] in 08/2015. Last visit 4 months ago.  Interim history: No increased urination, blurry vision, nausea, chest pain. She continues to have leg swelling - B legs wrapped.  Prediabetes:  Reviewed HbA1c levels: She continues to have leg swelling. Lab Results  Component Value Date   HGBA1C 6.7 (A) 07/19/2021   HGBA1C 6.1 (A) 11/29/2020   HGBA1C 6.2 (A) 05/26/2020  04/10/2021: HbA1c 6.5% 05/22/2018: HbA1c 5.8% 10/31/2015: HbA1c 6.5%  Pt is on: - Metformin 500 mg 2x a day started 08/2017.  Denies GI side effects.  She continues to not check sugars at home, despite multiple promptings. She tried a freestyle libre CGM in the past but this irritated her skin so she stopped it.  Glucometer: Contour Next  -No CKD, last BUN/creatinine:  04/10/2021: 88/0.94 Lab Results  Component Value Date   BUN 17 05/26/2020   Lab Results  Component Value Date   CREATININE 0.95 05/26/2020  04/07/2019: 16/1.03 05/08/2018: 16.4/1.10, ACR 1.0 07/03/2016: 11/0.83, glucose 98 10/31/2015: 16/0.87, Glucose 113 On telmisartan.  -+ HL; latest lipids: 04/10/2021: 198/155/45/126 Lab Results  Component Value Date   CHOL 172 05/26/2020   HDL 50.30 05/26/2020   LDLCALC 98 05/26/2020   TRIG 118.0 05/26/2020   CHOLHDL 3 05/26/2020  04/07/2019: 191/119/55/112 04/01/2018: 195/105/60/114 10/31/2015: 175/82/50/104 On atorvastatin  80.  -Latest eye exam 2023: reportedly No DR; she had cataract surgeries in 02 in 11/2018.  -no numbness and tingling in her feet.  On ASA 81.  Hypothyroidism:  Reviewed her TFTs: Lab Results  Component Value Date   TSH 3.48 07/19/2021   TSH 3.16 05/26/2020   TSH 3.08 05/26/2019   TSH 3.08 07/04/2017   TSH 1.27 12/21/2016  04/01/2018: TSH 3.80 03/06/2016: TSH 4.74 10/31/2015: TSH 4.36 (0.34-4.5)  Pt is on levothyroxine 150 mcg daily, taken: - in am - fasting - at least 30 min from b'fast - no calcium - no iron - + multivitamins later in the day - no PPIs - not on Biotin  She had a kidney stone removed - 05/08/2018.  She has a history of HTN, lymphedema, OA of her knees, mild intermittent asthma, eczema.   ROS: + See HPI, + joint aches  I reviewed pt's medications, allergies, PMH, social hx, family hx, and changes were documented in the history of present illness. Otherwise, unchanged from my initial visit note.  Past Medical History:  Diagnosis Date   Asthma, mild intermittent    Blurred vision, bilateral    Diabetes mellitus without complication (HCC)    type 2   Dyspnea    with exertion   History of kidney stones    Hyperlipidemia    Hypertension    Hypothyroidism    Lymphedema of both lower extremities    compressions hose when out anf wraps at home   Neuromuscular disorder (HCC)    Osteoarthritis of both knees    Past Surgical History:  Procedure Laterality Date   ABDOMINAL HYSTERECTOMY  partial   COLONOSCOPY WITH PROPOFOL N/A 01/17/2017   Procedure: COLONOSCOPY WITH PROPOFOL;  Surgeon: Charlott Rakes, MD;  Location: [facility];  Service: Endoscopy;  Laterality: N/A;   CYSTOSCOPY/RETROGRADE/URETEROSCOPY/STONE EXTRACTION WITH BASKET Right 06/30/2018   Procedure: CYSTOSCOPY/RETROGRADE/URETEROSCOPY/STONE EXTRACTION WITH BASKET INSERTION DOUBLE J STENT;  Surgeon: Bjorn Pippin, MD;  Location: [facility];  Service: Urology;  Laterality: Right;    EXTRACORPOREAL SHOCK WAVE LITHOTRIPSY Right 07/31/2018   Procedure: RIGHT EXTRACORPOREAL SHOCK WAVE LITHOTRIPSY (ESWL);  Surgeon: Heloise Purpura, MD;  Location: [facility];  Service: Urology;  Laterality: Right;   HOLMIUM LASER APPLICATION Right 06/30/2018   Procedure: HOLMIUM LASER APPLICATION;  Surgeon: Bjorn Pippin, MD;  Location: [facility];  Service: Urology;  Laterality: Right;   IR ABLATE LIVER CRYOABLATION  02/05/2020   IR ABLATE LIVER CRYOABLATION  03/11/2020   IR RADIOLOGIST EVAL & MGMT  01/21/2020   TUBAL LIGATION     URETEROSCOPY WITH HOLMIUM LASER LITHOTRIPSY Left 05/07/2018   Social History   Social History   Marital Status: Legally Separated    Spouse Name: N/A   Number of Children: 2   Occupational History   retired   Social History Main Topics   Smoking status: Never Smoker    Smokeless tobacco: Never Used   Alcohol Use: Vodka 1-2 drinks socially   Drug Use: No   Current Outpatient Medications on File Prior to Visit  Medication Sig Dispense Refill   albuterol (PROVENTIL HFA;VENTOLIN HFA) 108 (90 Base) MCG/ACT inhaler Inhale 2 puffs into the lungs every 4 (four) hours as needed for wheezing or shortness of breath.     amLODipine (NORVASC) 5 MG tablet Take 5 mg by mouth daily.     aspirin 81 MG tablet Take 81 mg by mouth daily.     atorvastatin (LIPITOR) 80 MG tablet Take 1 tablet (80 mg total) by mouth daily. 90 tablet 3   BAYER MICROLET LANCETS lancets Use as instructed used to check one time a day 100 each 5   Fluticasone-Salmeterol (ADVAIR) 250-50 MCG/DOSE AEPB Inhale 1 puff into the lungs 2 (two) times daily.      gabapentin (NEURONTIN) 300 MG capsule TAKE 1 CAPSULE BY MOUTH TWICE A DAY 60 capsule 0   glucose blood (BAYER CONTOUR NEXT TEST) test strip Use as instructed testing one time a day. 100 each 5   HYDROcodone-acetaminophen (NORCO) 5-325 MG tablet Take 1 tablet by mouth every 6 (six) hours as needed for moderate pain. 15 tablet 0   levothyroxine (SYNTHROID) 150 MCG  tablet Take 1 tablet (150 mcg total) by mouth daily before breakfast. 90 tablet 3   metFORMIN (GLUCOPHAGE) 500 MG tablet Take 1 tablet (500 mg total) by mouth 2 (two) times daily with a meal. 180 tablet 3   Multiple Vitamins-Minerals (MULTIVITAMIN WITH MINERALS) tablet Take 1 tablet by mouth daily.     potassium chloride (KLOR-CON) 10 MEQ tablet Take 20 mEq by mouth daily.      tamsulosin (FLOMAX) 0.4 MG CAPS capsule Take 0.4 mg by mouth daily.     telmisartan (MICARDIS) 80 MG tablet Take 80 mg by mouth daily.     traMADol (ULTRAM) 50 MG tablet Take 1 tablet (50 mg total) by mouth every 6 (six) hours as needed. 30 tablet 0   No current facility-administered medications on file prior to visit.   No Known Allergies Family History  Problem Relation Age of Onset   Cancer Mother        breast   Breast cancer  Mother 9   Hypertension Father    Breast cancer Sister 45   Breast cancer Sister 43   PE: BP 130/82 (BP Location: Right Wrist, Patient Position: Sitting, Cuff Size: Normal)    Pulse 72    Ht 5\' 1"  (1.549 m)    Wt (!) 323 lb 9.6 oz (146.8 kg)    SpO2 94%    BMI 61.14 kg/m  Wt Readings from Last 3 Encounters:  11/20/21 (!) 323 lb 9.6 oz (146.8 kg)  07/19/21 (!) 328 lb 9.6 oz (149.1 kg)  11/29/20 (!) 325 lb 3.2 oz (147.5 kg)   Constitutional: obese, in NAD, walks with a walker Eyes: PERRLA, EOMI, no exophthalmos ENT: moist mucous membranes, no thyromegaly, no cervical lymphadenopathy Cardiovascular: RRR, No MRG + bilateral significant lymphedema in LE R>L Respiratory: CTA B Musculoskeletal: no deformities, strength intact in all 4 Skin: moist, warm, no rashes Neurological: + Slight tremor with outstretched hands, DTR normal in all 4 Diabetic Foot Exam - Simple   Simple Foot Form Diabetic Foot exam was performed with the following findings: Yes 11/20/2021  2:10 PM  Visual Inspection See comments: Yes Sensation Testing Intact to touch and monofilament testing bilaterally:  Yes Pulse Check See comments: Yes Comments Posterior tibialis could not be felt due to significant lymphedema and wrappings. Onychodystrophy and nail induration in all toenails.  ASSESSMENT: 1.  Diabetes type 2  2. Hypothyroidism  3.  HL  PLAN:  1. Patient with history of prediabetes, history of prediabetes, on low-dose metformin, with a new diagnosis of diabetes at last visit, after the second HbA1c returned higher than the diabetic threshold, at 6.7%.  Previous HbA1c was also higher, at 6.5%. -At last visit, she was still not checking blood sugars and I again strongly advised her to do so. -She continues on metformin 500 mg twice a day.  She has no GI side effects from this. -At today's visit, she is still not checking blood sugars.  I again advised her to start.  Based on her excellent HbA1c, no changes are needed in her regimen for now. - I suggested to:  Patient Instructions  Please continue: - Metformin 500 mg 2x a day with meals.  Try to check sugars every day or every other day.  Look up "portfolio diet".  Continue levothyroxine 150 mcg daily.  Take the thyroid hormone every day, with water, at least 30 minutes before breakfast, separated by at least 4 hours from: - acid reflux medications - calcium - iron - multivitamins  Please come back for a follow-up appointment in 4 -6 months.  - we checked her HbA1c: 6.2% (better) - advised to check sugars at different times of the day - 1x a day, rotating check times - advised for yearly eye exams >> she is UTD - return to clinic in 4-6 months  2. Hypothyroidism - latest thyroid labs reviewed with pt. >> normal: Lab Results  Component Value Date   TSH 3.48 07/19/2021  - she continues on LT4 150 mcg daily - pt feels good on this dose. - we discussed about taking the thyroid hormone every day, with water, >30 minutes before breakfast, separated by >4 hours from acid reflux medications, calcium, iron, multivitamins. Pt.  is taking it correctly. -We will repeat her TFTs at next visit  3.  HL -Reviewed latest lipid panel from 03/2021: LDL increased, now 126, previously 98.  The rest of the fractions were at goal.   -She continues on Lipitor 80 mg  daily without side effects. -At last visit, I recommended an improvement in diet  -and advised her to try the "portfolio diet", but she forgot about this.  I again recommended today. -We will check another lipid panel at next visit  Carlus Pavlovristina Lexie Koehl, MD PhD [facility] End[facility]in[facility]

## 2021-11-20 NOTE — Patient Instructions (Addendum)
Please continue: - Metformin 500 mg 2x a day with meals.  Try to check sugars every day or every other day.  Look up "portfolio diet".  Continue levothyroxine 150 mcg daily.  Take the thyroid hormone every day, with water, at least 30 minutes before breakfast, separated by at least 4 hours from: - acid reflux medications - calcium - iron - multivitamins   Please return in 4-6 months with your sugar log.

## 2021-12-14 DIAGNOSIS — K219 Gastro-esophageal reflux disease without esophagitis: Secondary | ICD-10-CM | POA: Diagnosis not present

## 2021-12-14 DIAGNOSIS — R0982 Postnasal drip: Secondary | ICD-10-CM | POA: Diagnosis not present

## 2021-12-21 DIAGNOSIS — Z20822 Contact with and (suspected) exposure to covid-19: Secondary | ICD-10-CM | POA: Diagnosis not present

## 2022-01-19 DIAGNOSIS — Z20822 Contact with and (suspected) exposure to covid-19: Secondary | ICD-10-CM | POA: Diagnosis not present

## 2022-02-03 DIAGNOSIS — Z20822 Contact with and (suspected) exposure to covid-19: Secondary | ICD-10-CM | POA: Diagnosis not present

## 2022-02-08 DIAGNOSIS — Z20822 Contact with and (suspected) exposure to covid-19: Secondary | ICD-10-CM | POA: Diagnosis not present

## 2022-02-14 ENCOUNTER — Other Ambulatory Visit: Payer: Self-pay | Admitting: Internal Medicine

## 2022-02-14 DIAGNOSIS — E785 Hyperlipidemia, unspecified: Secondary | ICD-10-CM

## 2022-04-09 ENCOUNTER — Ambulatory Visit (INDEPENDENT_AMBULATORY_CARE_PROVIDER_SITE_OTHER): Payer: Medicare Other | Admitting: Internal Medicine

## 2022-04-09 ENCOUNTER — Encounter: Payer: Self-pay | Admitting: Internal Medicine

## 2022-04-09 VITALS — BP 140/100 | HR 68 | Ht 61.0 in | Wt 321.8 lb

## 2022-04-09 DIAGNOSIS — E119 Type 2 diabetes mellitus without complications: Secondary | ICD-10-CM

## 2022-04-09 DIAGNOSIS — E039 Hypothyroidism, unspecified: Secondary | ICD-10-CM

## 2022-04-09 DIAGNOSIS — E785 Hyperlipidemia, unspecified: Secondary | ICD-10-CM | POA: Diagnosis not present

## 2022-04-09 LAB — POCT GLYCOSYLATED HEMOGLOBIN (HGB A1C): Hemoglobin A1C: 6.6 % — AB (ref 4.0–5.6)

## 2022-04-09 MED ORDER — METFORMIN HCL 500 MG PO TABS: 500.0000 mg | ORAL_TABLET | Freq: Two times a day (BID) | ORAL | 3 refills | Status: DC

## 2022-04-09 NOTE — Progress Notes (Signed)
Patient ID: Jennifer Huffman, female   DOB: 03-Jan-1951, 71 y.o.   MRN: [id number]  HPI: Jennifer BAUMGARNER is a 71 y.o.-year-old female, returning for f/u for DM2, dx in 07/2021 (previously prediabetes dx in 2014), controlled uncontrolled, without complications. She moved from [place] in 08/2015. Last visit 5 months ago.  Interim history: No increased urination, blurry vision, nausea, chest pain. She continues to have leg swelling - B legs wrapped.  Prediabetes:  Reviewed HbA1c levels: She continues to have leg swelling. Lab Results  Component Value Date   HGBA1C 6.2 (A) 11/20/2021   HGBA1C 6.7 (A) 07/19/2021   HGBA1C 6.1 (A) 11/29/2020  04/10/2021: HbA1c 6.5% 05/22/2018: HbA1c 5.8% 10/31/2015: HbA1c 6.5%  Pt is on: - Metformin 500 mg 2x a day started 08/2017.  Denies GI side effects.  She continues to not check sugars at home, despite multiple promptings. She tried a freestyle libre CGM in the past but this irritated her skin so she stopped it.  Glucometer: Contour Next  -No CKD, last BUN/creatinine:  04/10/2021: 88/0.94 Lab Results  Component Value Date   BUN 17 05/26/2020   Lab Results  Component Value Date   CREATININE 0.95 05/26/2020  04/07/2019: 16/1.03 05/08/2018: 16.4/1.10, ACR 1.0 07/03/2016: 11/0.83, glucose 98 10/31/2015: 16/0.87, Glucose 113 On telmisartan.  -+ HL; latest lipids: 04/10/2021: 198/155/45/126 Lab Results  Component Value Date   CHOL 172 05/26/2020   HDL 50.30 05/26/2020   LDLCALC 98 05/26/2020   TRIG 118.0 05/26/2020   CHOLHDL 3 05/26/2020  04/07/2019: 191/119/55/112 04/01/2018: 195/105/60/114 10/31/2015: 175/82/50/104 On atorvastatin 80.  -Latest eye exam 2023: reportedly No DR; she had cataract surgeries in 02 in 11/2018.  -no numbness and tingling in her feet. Last foot exam 11/2021.  On ASA 81.  Hypothyroidism:  Reviewed her TFTs: Lab Results  Component Value Date   TSH 3.48 07/19/2021   TSH 3.16 05/26/2020   TSH  3.08 05/26/2019   TSH 3.08 07/04/2017   TSH 1.27 12/21/2016  04/01/2018: TSH 3.80 03/06/2016: TSH 4.74 10/31/2015: TSH 4.36 (0.34-4.5)  Pt is on levothyroxine 150 mcg daily, taken: - in am - fasting - at least 30 min from b'fast - no calcium - no iron - + multivitamins later in the day - no PPIs - not on Biotin  She had a kidney stone removed - 05/08/2018.  She has a history of HTN, lymphedema, OA of her knees, mild intermittent asthma, eczema.   ROS: + See HPI, + joint aches  I reviewed pt's medications, allergies, PMH, social hx, family hx, and changes were documented in the history of present illness. Otherwise, unchanged from my initial visit note.  Past Medical History:  Diagnosis Date   Asthma, mild intermittent    Blurred vision, bilateral    Diabetes mellitus without complication (HCC)    type 2   Dyspnea    with exertion   History of kidney stones    Hyperlipidemia    Hypertension    Hypothyroidism    Lymphedema of both lower extremities    compressions hose when out anf wraps at home   Neuromuscular disorder (HCC)    Osteoarthritis of both knees    Past Surgical History:  Procedure Laterality Date   ABDOMINAL HYSTERECTOMY     partial   COLONOSCOPY WITH PROPOFOL N/A 01/17/2017   Procedure: COLONOSCOPY WITH PROPOFOL;  Surgeon: Charlott Rakes, MD;  [facility]: [facility];  Service: Endoscopy;  Laterality: N/A;   CYSTOSCOPY/RETROGRADE/URETEROSCOPY/STONE EXTRACTION WITH BASKET Right 06/30/2018   Procedure:  CYSTOSCOPY/RETROGRADE/URETEROSCOPY/STONE EXTRACTION WITH BASKET INSERTION DOUBLE J STENT;  Surgeon: Bjorn Pippin, MD;  Location: [facility];  Service: Urology;  Laterality: Right;   EXTRACORPOREAL SHOCK WAVE LITHOTRIPSY Right 07/31/2018   Procedure: RIGHT EXTRACORPOREAL SHOCK WAVE LITHOTRIPSY (ESWL);  Surgeon: Heloise Purpura, MD;  Location: [facility];  Service: Urology;  Laterality: Right;   HOLMIUM LASER APPLICATION Right 06/30/2018   Procedure: HOLMIUM LASER  APPLICATION;  Surgeon: Bjorn Pippin, MD;  [facility]: [facility];  Service: Urology;  Laterality: Right;   IR ABLATE LIVER CRYOABLATION  02/05/2020   IR ABLATE LIVER CRYOABLATION  03/11/2020   IR RADIOLOGIST EVAL & MGMT  01/21/2020   TUBAL LIGATION     URETEROSCOPY WITH HOLMIUM LASER LITHOTRIPSY Left 05/07/2018   Social History   Social History   Marital Status: Legally Separated    Spouse Name: N/A   Number of Children: 2   Occupational History   retired   Social History Main Topics   Smoking status: Never Smoker    Smokeless tobacco: Never Used   Alcohol Use: Vodka 1-2 drinks socially   Drug Use: No   Current Outpatient Medications on File Prior to Visit  Medication Sig Dispense Refill   albuterol (PROVENTIL HFA;VENTOLIN HFA) 108 (90 Base) MCG/ACT inhaler Inhale 2 puffs into the lungs every 4 (four) hours as needed for wheezing or shortness of breath.     amLODipine (NORVASC) 5 MG tablet Take 5 mg by mouth daily.     aspirin 81 MG tablet Take 81 mg by mouth daily.     atorvastatin (LIPITOR) 20 MG tablet TAKE 1 TABLET BY MOUTH EVERY DAY X 90 DAYS 90 tablet 1   atorvastatin (LIPITOR) 80 MG tablet Take 1 tablet (80 mg total) by mouth daily. 90 tablet 3   BAYER MICROLET LANCETS lancets Use as instructed used to check one time a day 100 each 5   Fluticasone-Salmeterol (ADVAIR) 250-50 MCG/DOSE AEPB Inhale 1 puff into the lungs 2 (two) times daily.      gabapentin (NEURONTIN) 300 MG capsule TAKE 1 CAPSULE BY MOUTH TWICE A DAY 60 capsule 0   glucose blood (BAYER CONTOUR NEXT TEST) test strip Use as instructed testing one time a day. 100 each 5   HYDROcodone-acetaminophen (NORCO) 5-325 MG tablet Take 1 tablet by mouth every 6 (six) hours as needed for moderate pain. 15 tablet 0   levothyroxine (SYNTHROID) 150 MCG tablet Take 1 tablet (150 mcg total) by mouth daily before breakfast. 90 tablet 3   metFORMIN (GLUCOPHAGE) 500 MG tablet Take 1 tablet (500 mg total) by mouth 2 (two) times daily with a  meal. 180 tablet 3   Multiple Vitamins-Minerals (MULTIVITAMIN WITH MINERALS) tablet Take 1 tablet by mouth daily.     potassium chloride (KLOR-CON) 10 MEQ tablet Take 20 mEq by mouth daily.      tamsulosin (FLOMAX) 0.4 MG CAPS capsule Take 0.4 mg by mouth daily.     telmisartan (MICARDIS) 80 MG tablet Take 80 mg by mouth daily.     traMADol (ULTRAM) 50 MG tablet Take 1 tablet (50 mg total) by mouth every 6 (six) hours as needed. 30 tablet 0   No current facility-administered medications on file prior to visit.   No Known Allergies Family History  Problem Relation Age of Onset   Cancer Mother        breast   Breast cancer Mother 32   Hypertension Father    Breast cancer Sister 52   Breast cancer Sister 69  PE: There were no vitals taken for this visit. Wt Readings from Last 3 Encounters:  11/20/21 (!) 323 lb 9.6 oz (146.8 kg)  07/19/21 (!) 328 lb 9.6 oz (149.1 kg)  11/29/20 (!) 325 lb 3.2 oz (147.5 kg)   Constitutional: obese, in NAD, walks with a walker Eyes: PERRLA, EOMI, no exophthalmos ENT: moist mucous membranes, no thyromegaly, no cervical lymphadenopathy Cardiovascular: RRR, No MRG + bilateral significant lymphedema in LE R>L Respiratory: CTA B Musculoskeletal: no deformities Skin: moist, warm, no rashes Neurological: + Slight tremor with outstretched hands  ASSESSMENT: 1.  Diabetes type 2  2. Hypothyroidism  3.  HL  PLAN:  1. Patient with history of prediabetes, but now with diabetes, on low-dose metformin. -She continues on metformin 500 mg twice a day without side effects -At last visit, she was still not checking blood sugars and I again advised her to try to start.  However, we did not change her regimen based on the improved (6.2%) -At today's visit, she is still not checking blood sugars.  We again discussed about the importance of starting to check this at least once a day or every other day. - I suggested to:  Patient Instructions  Please continue: -  Metformin 500 mg 2x a day with meals.  Try to check sugars every day or every other day.  Look up "portfolio diet".  Continue levothyroxine 150 mcg daily.  Take the thyroid hormone every day, with water, at least 30 minutes before breakfast, separated by at least 4 hours from: - acid reflux medications - calcium - iron - multivitamins  Check what dose of Lipitor (atorvastatin) you are taking.   Please return in 4-6 months with your sugar log.   - we checked her HbA1c: 6.6% (higher, but still at goal) - advised to check sugars at different times of the day - 1x a day, rotating check times - advised for yearly eye exams >> she is UTD - return to clinic in 4-6 months   2. Hypothyroidism - latest thyroid labs reviewed with pt. >> normal: Lab Results  Component Value Date   TSH 3.48 07/19/2021  - she continues on LT4 150 mcg daily - pt feels good on this dose. - we discussed about taking the thyroid hormone every day, with water, >30 minutes before breakfast, separated by >4 hours from acid reflux medications, calcium, iron, multivitamins. Pt. is taking it correctly. - she is an appointment coming up with PCP for annual physical exam at the end of the week.  I am hoping that she can get her TSH checked at the same time.  3.  HL - Reviewed latest lipid panel from 03/2021: LDL increased from 98 to 126.  The rest of the fractions were at goal. - Continues Lipitor 80 mg daily without side effects. - I previously recommended an improvement in diet  -and advised her to try the "portfolio diet", but she did not start -She has an appointment with PCP coming up in few days  Carlus Pavlov, MD PhD [facility]

## 2022-04-09 NOTE — Patient Instructions (Addendum)
Please continue: - Metformin 500 mg 2x a day with meals.  Try to check sugars every day or every other day.  Look up "portfolio diet".  Continue levothyroxine 150 mcg daily.  Take the thyroid hormone every day, with water, at least 30 minutes before breakfast, separated by at least 4 hours from: - acid reflux medications - calcium - iron - multivitamins  Check what dose of Lipitor (atorvastatin) you are taking.   Please return in 4-6 months with your sugar log.

## 2022-04-13 DIAGNOSIS — Z90710 Acquired absence of both cervix and uterus: Secondary | ICD-10-CM | POA: Diagnosis not present

## 2022-04-13 DIAGNOSIS — E785 Hyperlipidemia, unspecified: Secondary | ICD-10-CM | POA: Diagnosis not present

## 2022-04-13 DIAGNOSIS — Z1331 Encounter for screening for depression: Secondary | ICD-10-CM | POA: Diagnosis not present

## 2022-04-13 DIAGNOSIS — R262 Difficulty in walking, not elsewhere classified: Secondary | ICD-10-CM | POA: Diagnosis not present

## 2022-04-13 DIAGNOSIS — Z0000 Encounter for general adult medical examination without abnormal findings: Secondary | ICD-10-CM | POA: Diagnosis not present

## 2022-04-13 DIAGNOSIS — J4520 Mild intermittent asthma, uncomplicated: Secondary | ICD-10-CM | POA: Diagnosis not present

## 2022-04-13 DIAGNOSIS — E039 Hypothyroidism, unspecified: Secondary | ICD-10-CM | POA: Diagnosis not present

## 2022-04-13 DIAGNOSIS — I890 Lymphedema, not elsewhere classified: Secondary | ICD-10-CM | POA: Diagnosis not present

## 2022-04-13 DIAGNOSIS — I10 Essential (primary) hypertension: Secondary | ICD-10-CM | POA: Diagnosis not present

## 2022-04-13 DIAGNOSIS — G5601 Carpal tunnel syndrome, right upper limb: Secondary | ICD-10-CM | POA: Diagnosis not present

## 2022-05-01 DIAGNOSIS — M25562 Pain in left knee: Secondary | ICD-10-CM | POA: Diagnosis not present

## 2022-05-01 DIAGNOSIS — M170 Bilateral primary osteoarthritis of knee: Secondary | ICD-10-CM | POA: Diagnosis not present

## 2022-05-01 DIAGNOSIS — M25561 Pain in right knee: Secondary | ICD-10-CM | POA: Diagnosis not present

## 2022-05-01 DIAGNOSIS — G5601 Carpal tunnel syndrome, right upper limb: Secondary | ICD-10-CM | POA: Diagnosis not present

## 2022-05-04 DIAGNOSIS — R8271 Bacteriuria: Secondary | ICD-10-CM | POA: Diagnosis not present

## 2022-05-04 DIAGNOSIS — N200 Calculus of kidney: Secondary | ICD-10-CM | POA: Diagnosis not present

## 2022-06-08 ENCOUNTER — Other Ambulatory Visit: Payer: Self-pay | Admitting: Gastroenterology

## 2022-06-19 ENCOUNTER — Encounter (INDEPENDENT_AMBULATORY_CARE_PROVIDER_SITE_OTHER): Payer: BLUE CROSS/BLUE SHIELD | Admitting: Family Medicine

## 2022-06-28 DIAGNOSIS — Z23 Encounter for immunization: Secondary | ICD-10-CM | POA: Diagnosis not present

## 2022-07-11 DIAGNOSIS — G5601 Carpal tunnel syndrome, right upper limb: Secondary | ICD-10-CM | POA: Diagnosis not present

## 2022-07-11 DIAGNOSIS — Z23 Encounter for immunization: Secondary | ICD-10-CM | POA: Diagnosis not present

## 2022-07-11 DIAGNOSIS — I890 Lymphedema, not elsewhere classified: Secondary | ICD-10-CM | POA: Diagnosis not present

## 2022-07-11 DIAGNOSIS — I10 Essential (primary) hypertension: Secondary | ICD-10-CM | POA: Diagnosis not present

## 2022-07-11 DIAGNOSIS — E1169 Type 2 diabetes mellitus with other specified complication: Secondary | ICD-10-CM | POA: Diagnosis not present

## 2022-07-12 ENCOUNTER — Other Ambulatory Visit: Payer: Self-pay | Admitting: Internal Medicine

## 2022-07-12 DIAGNOSIS — Z1231 Encounter for screening mammogram for malignant neoplasm of breast: Secondary | ICD-10-CM

## 2022-08-11 ENCOUNTER — Other Ambulatory Visit: Payer: Self-pay | Admitting: Internal Medicine

## 2022-08-11 DIAGNOSIS — E785 Hyperlipidemia, unspecified: Secondary | ICD-10-CM

## 2022-08-21 ENCOUNTER — Ambulatory Visit: Payer: Medicare Other

## 2022-08-28 ENCOUNTER — Ambulatory Visit (HOSPITAL_COMMUNITY): Admission: RE | Admit: 2022-08-28 | Payer: Medicare Other | Source: Home / Self Care | Admitting: Gastroenterology

## 2022-08-28 ENCOUNTER — Encounter (HOSPITAL_COMMUNITY): Admission: RE | Payer: Self-pay | Source: Home / Self Care

## 2022-08-28 SURGERY — COLONOSCOPY WITH PROPOFOL
Anesthesia: Monitor Anesthesia Care

## 2022-09-18 DIAGNOSIS — L209 Atopic dermatitis, unspecified: Secondary | ICD-10-CM | POA: Diagnosis not present

## 2022-10-11 ENCOUNTER — Ambulatory Visit (INDEPENDENT_AMBULATORY_CARE_PROVIDER_SITE_OTHER): Payer: Medicare Other | Admitting: Internal Medicine

## 2022-10-11 ENCOUNTER — Encounter: Payer: Self-pay | Admitting: Internal Medicine

## 2022-10-11 VITALS — BP 132/78 | HR 74 | Ht 61.0 in | Wt 312.0 lb

## 2022-10-11 DIAGNOSIS — E785 Hyperlipidemia, unspecified: Secondary | ICD-10-CM

## 2022-10-11 DIAGNOSIS — E039 Hypothyroidism, unspecified: Secondary | ICD-10-CM

## 2022-10-11 DIAGNOSIS — E119 Type 2 diabetes mellitus without complications: Secondary | ICD-10-CM | POA: Diagnosis not present

## 2022-10-11 LAB — POCT GLYCOSYLATED HEMOGLOBIN (HGB A1C): Hemoglobin A1C: 6.3 % — AB (ref 4.0–5.6)

## 2022-10-11 MED ORDER — LEVOTHYROXINE SODIUM 150 MCG PO TABS: 150.0000 ug | ORAL_TABLET | Freq: Every day | ORAL | 3 refills | Status: DC

## 2022-10-11 NOTE — Progress Notes (Signed)
Patient ID: Jennifer Huffman, female   DOB: 09/21/1951, 72 y.o.   MRN: [id number]  HPI: Jennifer Huffman is a 72 y.o.-year-old female, returning for f/u for DM2, dx in 07/2021 (previously prediabetes dx in 2014), controlled uncontrolled, without complications. She moved from [place] in 08/2015. Last visit 6 months ago.  Interim history: No increased urination, blurry vision, nausea, chest pain. She lost 9 lbs since last OV. She continues to have leg swelling - B legs wrapped.  Prediabetes:  Reviewed HbA1c levels:  Lab Results  Component Value Date   HGBA1C 6.6 (A) 04/09/2022   HGBA1C 6.2 (A) 11/20/2021   HGBA1C 6.7 (A) 07/19/2021  04/10/2021: HbA1c 6.5% 05/22/2018: HbA1c 5.8% 10/31/2015: HbA1c 6.5%  Pt is on: - Metformin 500 mg 2x a day started 08/2017.  Denies GI side effects.  She continues to not check sugars at home, despite multiple promptings. She tried a freestyle libre CGM in the past but this irritated her skin so she stopped it.  Glucometer: Contour Next  -No CKD, last BUN/creatinine:  04/13/2022: 13/0.84, GFR 75 Lab Results  Component Value Date   BUN 17 05/26/2020   Lab Results  Component Value Date   CREATININE 0.95 05/26/2020  On telmisartan.  -+ HL; latest lipids: 04/13/2022: 173/145/49/98 04/10/2021: 198/155/45/126 Lab Results  Component Value Date   CHOL 172 05/26/2020   HDL 50.30 05/26/2020   LDLCALC 98 05/26/2020   TRIG 118.0 05/26/2020   CHOLHDL 3 05/26/2020  04/07/2019: 191/119/55/112 04/01/2018: 195/105/60/114 10/31/2015: 175/82/50/104 On atorvastatin 80.  -Latest eye exam 2023: reportedly No DR; she had cataract surgeries in 02 in 11/2018.  -no numbness and tingling in her feet. Last foot exam 11/20/2021.  On ASA 81.  Hypothyroidism:  Reviewed her TFTs: 04/13/2022: TSH 1.56 Lab Results  Component Value Date   TSH 3.48 07/19/2021   TSH 3.16 05/26/2020   TSH 3.08 05/26/2019   TSH 3.08 07/04/2017   TSH 1.27 12/21/2016   04/01/2018: TSH 3.80 03/06/2016: TSH 4.74 10/31/2015: TSH 4.36 (0.34-4.5)  Pt is on levothyroxine 150 mcg daily, taken: - in am - fasting - at least 30 min from b'fast - no calcium - no iron - + multivitamins later in the day - no PPIs - not on Biotin  She had a kidney stone removed - 05/08/2018.  She has a history of HTN, lymphedema, OA of her knees, mild intermittent asthma, eczema.   ROS: + See HPI  I reviewed pt's medications, allergies, PMH, social hx, family hx, and changes were documented in the history of present illness. Otherwise, unchanged from my initial visit note.  Past Medical History:  Diagnosis Date   Asthma, mild intermittent    Blurred vision, bilateral    Diabetes mellitus without complication (HCC)    type 2   Dyspnea    with exertion   History of kidney stones    Hyperlipidemia    Hypertension    Hypothyroidism    Lymphedema of both lower extremities    compressions hose when out anf wraps at home   Neuromuscular disorder ([place])    Osteoarthritis of both knees    Past Surgical History:  Procedure Laterality Date   ABDOMINAL HYSTERECTOMY     partial   COLONOSCOPY WITH PROPOFOL N/A 01/17/2017   Procedure: COLONOSCOPY WITH PROPOFOL;  Surgeon: Wilford Corner, MD;  [facility];  Service: Endoscopy;  Laterality: N/A;   CYSTOSCOPY/RETROGRADE/URETEROSCOPY/STONE EXTRACTION WITH BASKET Right 06/30/2018   Procedure: CYSTOSCOPY/RETROGRADE/URETEROSCOPY/STONE EXTRACTION WITH BASKET INSERTION DOUBLE J STENT;  Surgeon: Irine Seal, MD;  [facility];  Service: Urology;  Laterality: Right;   EXTRACORPOREAL SHOCK WAVE LITHOTRIPSY Right 07/31/2018   Procedure: RIGHT EXTRACORPOREAL SHOCK WAVE LITHOTRIPSY (ESWL);  Surgeon: Raynelle Bring, MD;  Location[facility];  Service: Urology;  Laterality: Right;   HOLMIUM LASER APPLICATION Right 6/72/0947   Procedure: HOLMIUM LASER APPLICATION;  Surgeon: Irine Seal, MD;  [facility];  Service: Urology;   Laterality: Right;   IR ABLATE LIVER CRYOABLATION  02/05/2020   IR ABLATE LIVER CRYOABLATION  03/11/2020   IR RADIOLOGIST EVAL & MGMT  01/21/2020   TUBAL LIGATION     URETEROSCOPY WITH HOLMIUM LASER LITHOTRIPSY Left 05/07/2018   Social History   Social History   Marital Status: Legally Separated    Spouse Name: N/A   Number of Children: 2   Occupational History   retired   Social History Main Topics   Smoking status: Never Smoker    Smokeless tobacco: Never Used   Alcohol Use: Vodka 1-2 drinks socially   Drug Use: No   Current Outpatient Medications on File Prior to Visit  Medication Sig Dispense Refill   albuterol (PROVENTIL HFA;VENTOLIN HFA) 108 (90 Base) MCG/ACT inhaler Inhale 2 puffs into the lungs every 4 (four) hours as needed for wheezing or shortness of breath.     amLODipine (NORVASC) 5 MG tablet Take 5 mg by mouth daily.     aspirin 81 MG tablet Take 81 mg by mouth daily.     atorvastatin (LIPITOR) 20 MG tablet TAKE 1 TABLET BY MOUTH EVERY DAY 90 tablet 1   atorvastatin (LIPITOR) 80 MG tablet Take 1 tablet (80 mg total) by mouth daily. 90 tablet 3   BAYER MICROLET LANCETS lancets Use as instructed used to check one time a day 100 each 5   Fluticasone-Salmeterol (ADVAIR) 250-50 MCG/DOSE AEPB Inhale 1 puff into the lungs 2 (two) times daily.      gabapentin (NEURONTIN) 300 MG capsule TAKE 1 CAPSULE BY MOUTH TWICE A DAY 60 capsule 0   glucose blood (BAYER CONTOUR NEXT TEST) test strip Use as instructed testing one time a day. 100 each 5   HYDROcodone-acetaminophen (NORCO) 5-325 MG tablet Take 1 tablet by mouth every 6 (six) hours as needed for moderate pain. 15 tablet 0   levothyroxine (SYNTHROID) 150 MCG tablet Take 1 tablet (150 mcg total) by mouth daily before breakfast. 90 tablet 3   metFORMIN (GLUCOPHAGE) 500 MG tablet Take 1 tablet (500 mg total) by mouth 2 (two) times daily with a meal. 180 tablet 3   Multiple Vitamins-Minerals (MULTIVITAMIN WITH MINERALS) tablet Take 1  tablet by mouth daily.     potassium chloride (KLOR-CON) 10 MEQ tablet Take 20 mEq by mouth daily.      tamsulosin (FLOMAX) 0.4 MG CAPS capsule Take 0.4 mg by mouth daily.     telmisartan (MICARDIS) 80 MG tablet Take 80 mg by mouth daily.     traMADol (ULTRAM) 50 MG tablet Take 1 tablet (50 mg total) by mouth every 6 (six) hours as needed. 30 tablet 0   No current facility-administered medications on file prior to visit.   No Known Allergies Family History  Problem Relation Age of Onset   Cancer Mother        breast   Breast cancer Mother 20   Hypertension Father    Breast cancer Sister 38   Breast cancer Sister 88   PE: BP 132/78 (BP Location: Right Wrist, Patient Position: Sitting, Cuff  Size: Normal)   Pulse 74   Ht 5\' 1"  (1.549 m)   Wt (!) 312 lb (141.5 kg)   SpO2 94%   BMI 58.95 kg/m  Wt Readings from Last 3 Encounters:  10/11/22 (!) 312 lb (141.5 kg)  04/09/22 (!) 321 lb 12.8 oz (146 kg)  11/20/21 (!) 323 lb 9.6 oz (146.8 kg)   Constitutional: obese, in NAD, walks with a walker Eyes: EOMI, no exophthalmos ENT: no thyromegaly, no cervical lymphadenopathy Cardiovascular: RRR, No MRG + bilateral significant lymphedema in LE R>L Respiratory: CTA B Musculoskeletal: no deformities Skin: no rashes Neurological: + Slight tremor with outstretched hands  ASSESSMENT: 1.  Diabetes type 2  2. Hypothyroidism  3.  HL  PLAN:  1. Patient with diabetes, now with diabetes, on low-dose metformin.  She is taking 500 mg twice a day with no side effects.  I was not able to convince her to check blood sugars despite repeated advice and explanation of why this is necessary.  Therefore, we are treating her based only on the HbA1c, which is not ideal. -At last visit, HbA1c was higher, at 6.6%. -At today's visit, she still does not check blood sugars.  Again advised her that this will be important. -For now, in the light of improving HbA1c (see below), we discussed about continuing the  current regimen. - I suggested to:  Patient Instructions  Please continue: - Metformin 500 mg 2x a day with meals.  Try to check sugars every day or every other day.  Continue levothyroxine 150 mcg daily.  Take the thyroid hormone every day, with water, at least 30 minutes before breakfast, separated by at least 4 hours from: - acid reflux medications - calcium - iron - multivitamins   Please return in 4-6 months with your sugar log.   - we checked her HbA1c: 6.3% (lower) - advised to check sugars at different times of the day - 1x a day, rotating check times - advised for yearly eye exams >> she is UTD - UTD with flu shot (07/2022) - return to clinic in 4-6 months  2. Hypothyroidism - latest thyroid labs reviewed with pt. >> normal in 03/2022 per review of [facility] records - she continues on LT4 150 mcg daily - pt feels good on this dose. - we discussed about taking the thyroid hormone every day, with water, >30 minutes before breakfast, separated by >4 hours from acid reflux medications, calcium, iron, multivitamins. Pt. is taking it correctly. - I refilled her levothyroxine  3.  HL -Reviewed latest lipid panel from 03/2022: LDL above target, but improved.  The rest the fractions were at goal. -She continues on Lipitor 80 mg daily without side effects  05/2022, MD PhD [facility]

## 2022-10-11 NOTE — Patient Instructions (Signed)
Please continue: - Metformin 500 mg 2x a day with meals.  Try to check sugars every day or every other day.  Continue levothyroxine 150 mcg daily.  Take the thyroid hormone every day, with water, at least 30 minutes before breakfast, separated by at least 4 hours from: - acid reflux medications - calcium - iron - multivitamins   Please return in 4-6 months with your sugar log.

## 2022-10-16 ENCOUNTER — Ambulatory Visit: Payer: Medicare Other

## 2022-10-31 ENCOUNTER — Other Ambulatory Visit: Payer: Self-pay | Admitting: Gastroenterology

## 2022-11-20 DIAGNOSIS — G5601 Carpal tunnel syndrome, right upper limb: Secondary | ICD-10-CM | POA: Diagnosis not present

## 2022-11-20 DIAGNOSIS — M170 Bilateral primary osteoarthritis of knee: Secondary | ICD-10-CM | POA: Diagnosis not present

## 2022-11-20 DIAGNOSIS — M25531 Pain in right wrist: Secondary | ICD-10-CM | POA: Diagnosis not present

## 2022-11-26 ENCOUNTER — Other Ambulatory Visit: Payer: Self-pay | Admitting: Orthopedic Surgery

## 2022-11-29 DIAGNOSIS — R0982 Postnasal drip: Secondary | ICD-10-CM | POA: Diagnosis not present

## 2022-12-04 ENCOUNTER — Encounter (HOSPITAL_COMMUNITY): Payer: Self-pay | Admitting: Gastroenterology

## 2022-12-04 ENCOUNTER — Ambulatory Visit
Admission: RE | Admit: 2022-12-04 | Discharge: 2022-12-04 | Disposition: A | Discharge status: Home / Self Care | Payer: Medicare Other | Source: Ambulatory Visit | Attending: Internal Medicine | Admitting: Internal Medicine

## 2022-12-04 DIAGNOSIS — Z1231 Encounter for screening mammogram for malignant neoplasm of breast: Secondary | ICD-10-CM | POA: Diagnosis not present

## 2022-12-11 ENCOUNTER — Encounter (HOSPITAL_COMMUNITY): Payer: Self-pay | Admitting: Gastroenterology

## 2022-12-11 ENCOUNTER — Ambulatory Visit (HOSPITAL_COMMUNITY): Payer: Medicare Other | Admitting: Anesthesiology

## 2022-12-11 ENCOUNTER — Other Ambulatory Visit: Payer: Self-pay

## 2022-12-11 ENCOUNTER — Ambulatory Visit (HOSPITAL_COMMUNITY)
Admission: RE | Admit: 2022-12-11 | Discharge: 2022-12-11 | Disposition: A | Discharge status: Home / Self Care | Payer: Medicare Other | Attending: Gastroenterology | Admitting: Gastroenterology

## 2022-12-11 ENCOUNTER — Ambulatory Visit (HOSPITAL_BASED_OUTPATIENT_CLINIC_OR_DEPARTMENT_OTHER): Payer: Medicare Other | Admitting: Anesthesiology

## 2022-12-11 ENCOUNTER — Encounter (HOSPITAL_COMMUNITY)
Admission: RE | Disposition: A | Discharge status: Home / Self Care | Payer: Self-pay | Source: Home / Self Care | Attending: Gastroenterology

## 2022-12-11 DIAGNOSIS — E039 Hypothyroidism, unspecified: Secondary | ICD-10-CM | POA: Insufficient documentation

## 2022-12-11 DIAGNOSIS — I10 Essential (primary) hypertension: Secondary | ICD-10-CM

## 2022-12-11 DIAGNOSIS — Z800 Family history of malignant neoplasm of digestive organs: Secondary | ICD-10-CM | POA: Insufficient documentation

## 2022-12-11 DIAGNOSIS — K640 First degree hemorrhoids: Secondary | ICD-10-CM | POA: Diagnosis not present

## 2022-12-11 DIAGNOSIS — J45909 Unspecified asthma, uncomplicated: Secondary | ICD-10-CM | POA: Insufficient documentation

## 2022-12-11 DIAGNOSIS — K5730 Diverticulosis of large intestine without perforation or abscess without bleeding: Secondary | ICD-10-CM

## 2022-12-11 DIAGNOSIS — Z1211 Encounter for screening for malignant neoplasm of colon: Secondary | ICD-10-CM | POA: Diagnosis not present

## 2022-12-11 DIAGNOSIS — Z79899 Other long term (current) drug therapy: Secondary | ICD-10-CM | POA: Insufficient documentation

## 2022-12-11 DIAGNOSIS — Z6841 Body Mass Index (BMI) 40.0 and over, adult: Secondary | ICD-10-CM | POA: Insufficient documentation

## 2022-12-11 DIAGNOSIS — E119 Type 2 diabetes mellitus without complications: Secondary | ICD-10-CM | POA: Diagnosis not present

## 2022-12-11 DIAGNOSIS — Z7984 Long term (current) use of oral hypoglycemic drugs: Secondary | ICD-10-CM | POA: Diagnosis not present

## 2022-12-11 HISTORY — PX: COLONOSCOPY WITH PROPOFOL: SHX5780

## 2022-12-11 LAB — GLUCOSE, CAPILLARY: Glucose-Capillary: 124 mg/dL — ABNORMAL HIGH (ref 70–99)

## 2022-12-11 SURGERY — COLONOSCOPY WITH PROPOFOL
Anesthesia: Monitor Anesthesia Care

## 2022-12-11 MED ORDER — LACTATED RINGERS IV SOLN: INTRAVENOUS | Status: DC

## 2022-12-11 MED ORDER — PROPOFOL 10 MG/ML IV BOLUS
INTRAVENOUS | Status: DC | PRN
  Administered 2022-12-11 (×2): 20 mg via INTRAVENOUS

## 2022-12-11 MED ORDER — PROPOFOL 500 MG/50ML IV EMUL
INTRAVENOUS | Status: DC | PRN
  Administered 2022-12-11: 150 ug/kg/min via INTRAVENOUS

## 2022-12-11 MED ORDER — SODIUM CHLORIDE 0.9 % IV SOLN: INTRAVENOUS | Status: DC

## 2022-12-11 SURGICAL SUPPLY — 22 items

## 2022-12-11 NOTE — H&P (Signed)
Date of Initial H&P: 11/26/22  History reviewed, patient examined, no change in status, stable for surgery.

## 2022-12-11 NOTE — Interval H&P Note (Signed)
History and Physical Interval Note:  12/11/2022 9:01 AM  Jennifer Huffman  has presented today for surgery, with the diagnosis of Screening/Family history of colon cancer.  The various methods of treatment have been discussed with the patient and family. After consideration of risks, benefits and other options for treatment, the patient has consented to  Procedure(s): COLONOSCOPY WITH PROPOFOL (N/A) as a surgical intervention.  The patient's history has been reviewed, patient examined, no change in status, stable for surgery.  I have reviewed the patient's chart and labs.  Questions were answered to the patient's satisfaction.     Jennifer Huffman

## 2022-12-11 NOTE — Anesthesia Preprocedure Evaluation (Addendum)
Anesthesia Evaluation  Patient identified by MRN, date of birth, ID band Patient awake    Reviewed: Allergy & Precautions, NPO status , Patient's Chart, lab work & pertinent test results  Airway Mallampati: II  TM Distance: >3 FB Neck ROM: Full    Dental  (+) Dental Advisory Given, Edentulous Upper, Upper Dentures   Pulmonary shortness of breath, asthma    Pulmonary exam normal breath sounds clear to auscultation       Cardiovascular hypertension, Pt. on medications and Pt. on home beta blockers Normal cardiovascular exam Rhythm:Regular Rate:Normal     Neuro/Psych negative neurological ROS     GI/Hepatic negative GI ROS, Neg liver ROS,,,  Endo/Other  diabetes, Type 2, Oral Hypoglycemic AgentsHypothyroidism  Morbid obesity  Renal/GU Renal InsufficiencyRenal disease     Musculoskeletal  (+) Arthritis ,    Abdominal  (+) + obese  Peds  Hematology negative hematology ROS (+)   Anesthesia Other Findings   Reproductive/Obstetrics                             Lab Results  Component Value Date   WBC 10.5 07/01/2018   HGB 11.3 (L) 07/01/2018   HCT 36.5 07/01/2018   MCV 85.5 07/01/2018   PLT 309 07/01/2018   Lab Results  Component Value Date   CREATININE 0.95 05/26/2020   BUN 17 05/26/2020   NA 139 05/26/2020   K 4.3 05/26/2020   CL 102 05/26/2020   CO2 29 05/26/2020    Anesthesia Physical Anesthesia Plan  ASA: 4  Anesthesia Plan: MAC   Post-op Pain Management: Minimal or no pain anticipated   Induction: Intravenous  PONV Risk Score and Plan: 3 and Ondansetron, Dexamethasone and Treatment may vary due to age or medical condition  Airway Management Planned: Simple Face Mask  Additional Equipment:   Intra-op Plan:   Post-operative Plan:   Informed Consent: I have reviewed the patients History and Physical, chart, labs and discussed the procedure including the risks,  benefits and alternatives for the proposed anesthesia with the patient or authorized representative who has indicated his/her understanding and acceptance.     Dental advisory given  Plan Discussed with: CRNA  Anesthesia Plan Comments:         Anesthesia Quick Evaluation

## 2022-12-11 NOTE — Anesthesia Postprocedure Evaluation (Signed)
Anesthesia Post Note  Patient: Jennifer Huffman  Procedure(s) Performed: COLONOSCOPY WITH PROPOFOL     Patient location during evaluation: PACU Anesthesia Type: MAC Level of consciousness: awake and alert Pain management: pain level controlled Vital Signs Assessment: post-procedure vital signs reviewed and stable Respiratory status: spontaneous breathing Cardiovascular status: stable Anesthetic complications: no   No notable events documented.  Last Vitals:  Vitals:   12/11/22 0939 12/11/22 0949  BP: (!) 109/50 (!) 106/52  Pulse: (!) 56 (!) 52  Resp: 15 18  Temp:    SpO2: 98% 98%    Last Pain:  Vitals:   12/11/22 0949  TempSrc:   PainSc: 0-No pain                 Nolon Nations

## 2022-12-11 NOTE — Anesthesia Procedure Notes (Signed)
Procedure Name: MAC Date/Time: 12/11/2022 9:15 AM  Performed by: Lieutenant Diego, CRNAPre-anesthesia Checklist: Patient identified, Emergency Drugs available, Suction available, Patient being monitored and Timeout performed Oxygen Delivery Method: Nasal cannula Preoxygenation: Pre-oxygenation with 100% oxygen Induction Type: IV induction

## 2022-12-11 NOTE — Discharge Instructions (Signed)
Repeat colonoscopy in 5 years due to family history of colon cancer. 

## 2022-12-11 NOTE — Transfer of Care (Signed)
Immediate Anesthesia Transfer of Care Note  Patient: Jennifer Huffman  Procedure(s) Performed: COLONOSCOPY WITH PROPOFOL  Patient Location: Endoscopy Unit  Anesthesia Type:MAC  Level of Consciousness: awake  Airway & Oxygen Therapy: Patient Spontanous Breathing and Patient connected to nasal cannula oxygen  Post-op Assessment: Report given to RN and Post -op Vital signs reviewed and stable  Post vital signs: Reviewed and stable  Last Vitals:  Vitals Value Taken Time  BP    Temp    Pulse    Resp    SpO2      Last Pain:  Vitals:   12/11/22 0755  TempSrc: Temporal  PainSc: 0-No pain         Complications: No notable events documented.

## 2022-12-11 NOTE — Op Note (Signed)
[facility] Patient Name: Jennifer Huffman Procedure Date: 12/11/2022 MRN: [id number] Attending MD: [name] , MD, [id number] Date of Birth: [birth year] CSN: [id number] Age: 72 Admit Type: Outpatient Procedure:                Colonoscopy Indications:              Colon cancer screening in patient at increased                            risk: Colorectal cancer in father Providers:                [name], MD, Dulcy Fanny, Janee Morn, Technician Referring MD:             Leeroy Cha Medicines:                Propofol per Anesthesia, Monitored Anesthesia Care Complications:            No immediate complications. Estimated Blood Loss:     Estimated blood loss: none. Procedure:                Pre-Anesthesia Assessment:                           - Prior to the procedure, a History and Physical                            was performed, and patient medications and                            allergies were reviewed. The patient's tolerance of                            previous anesthesia was also reviewed. The risks                            and benefits of the procedure and the sedation                            options and risks were discussed with the patient.                            All questions were answered, and informed consent                            was obtained. Prior Anticoagulants: The patient has                            taken no anticoagulant or antiplatelet agents. ASA                            Grade Assessment: IV - A patient with severe  systemic disease that is a constant threat to life.                            After reviewing the risks and benefits, the patient                            was deemed in satisfactory condition to undergo the                            procedure.                           After obtaining informed consent, the  colonoscope                            was passed under direct vision. Throughout the                            procedure, the patient's blood pressure, pulse, and                            oxygen saturations were monitored continuously. The                            [id number]-HQ190L [id number]) Olympus colonoscope was                            introduced through the anus and advanced to the the                            cecum, identified by appendiceal orifice and                            ileocecal valve. The colonoscopy was performed                            without difficulty. The patient tolerated the                            procedure well. The quality of the bowel                            preparation was fair and fair but repeated                            irrigation led to a good and adequate prep. The                            terminal ileum, ileocecal valve, appendiceal                            orifice, and rectum were photographed. Scope In: 9:13:59 AM Scope Out: 9:25:16 AM Scope Withdrawal Time: 0 hours 7 minutes 38 seconds  Total Procedure Duration: 0 hours 11  minutes 17 seconds  Findings:      The perianal and digital rectal examinations were normal.      Multiple medium-mouthed and small-mouthed diverticula were found in the       sigmoid colon.      Internal hemorrhoids were found during retroflexion. The hemorrhoids       were medium-sized and Grade I (internal hemorrhoids that do not       prolapse).      The terminal ileum appeared normal. Impression:               - Preparation of the colon was fair.                           - Diverticulosis in the sigmoid colon.                           - Internal hemorrhoids.                           - The examined portion of the ileum was normal.                           - No specimens collected. Moderate Sedation:      N/A - MAC procedure Recommendation:           - Patient has a contact number available for                             emergencies. The signs and symptoms of potential                            delayed complications were discussed with the                            patient. Return to normal activities tomorrow.                            Written discharge instructions were provided to the                            patient.                           - High fiber diet.                           - Repeat colonoscopy in 5 years for screening                            purposes. Procedure Code(s):        --- Professional ---                           [phone], Colonoscopy, flexible; diagnostic, including                            collection of specimen(s) by brushing or washing,  when performed (separate procedure) Diagnosis Code(s):        --- Professional ---                           Z80.0, Family history of malignant neoplasm of                            digestive organs                           K64.0, First degree hemorrhoids                           K57.30, Diverticulosis of large intestine without                            perforation or abscess without bleeding CPT copyright 2022 American Medical Association. All rights reserved. The codes documented in this report are preliminary and upon coder review may  be revised to meet current compliance requirements. [name], MD 12/11/2022 9:33:39 AM This report has been signed electronically. Number of Addenda: 0

## 2022-12-14 ENCOUNTER — Encounter (HOSPITAL_COMMUNITY): Payer: Self-pay | Admitting: Gastroenterology

## 2023-01-03 ENCOUNTER — Other Ambulatory Visit (HOSPITAL_COMMUNITY): Payer: Self-pay | Admitting: *Deleted

## 2023-01-03 NOTE — Patient Instructions (Addendum)
SURGICAL WAITING ROOM VISITATION Patients having surgery or a procedure may have no more than 2 support people in the waiting area - these visitors may rotate in the visitor waiting room.   Due to an increase in RSV and influenza rates and associated hospitalizations, children ages 24 and under may not visit patients in [place]. If the patient needs to stay at the hospital during part of their recovery, the visitor guidelines for inpatient rooms apply.  PRE-OP VISITATION  Pre-op nurse will coordinate an appropriate time for 1 support person to accompany the patient in pre-op.  This support person may not rotate.  This visitor will be contacted when the time is appropriate for the visitor to come back in the pre-op area.  Please refer to the [facility] website for the visitor guidelines for Inpatients (after your surgery is over and you are in a regular room).  You are not required to quarantine at this time prior to your surgery. However, you must do this: Hand Hygiene often Do NOT share personal items Notify your provider if you are in close contact with someone who has COVID or you develop fever 100.4 or greater, new onset of sneezing, cough, sore throat, shortness of breath or body aches.  If you test positive for Covid or have been in contact with anyone that has tested positive in the last 10 days please notify you surgeon.    Your procedure is scheduled on:  Monday   January 07, 2023  Report to [facility] Main Entrance: [place] entrance where the [employer] is available.   Report to admitting at: 12:45  PM  +++++Call this number if you have any questions or problems the morning of surgery [phone]  Do not eat food after Midnight the night prior to your surgery/procedure.  After Midnight you may have the following liquids until  12:00 Noon DAY OF SURGERY  Clear Liquid Diet Water Black Coffee (sugar ok, NO MILK/CREAM OR CREAMERS)  Tea (sugar ok, NO  MILK/CREAM OR CREAMERS) regular and decaf                             Plain Jell-O  with no fruit (NO RED)                                           Fruit ices (not with fruit pulp, NO RED)                                     Popsicles (NO RED)                                                                  Juice: apple, WHITE grape, WHITE cranberry Sports drinks like Gatorade or Powerade (NO RED)                    The day of surgery:  Drink ONE (1) Pre-Surgery G2 at 12:00  noon the day of surgery. Drink in one sitting. Do not  sip.  This drink was given to you during your hospital pre-op appointment visit. Nothing else to drink after completing the Pre-Surgery G2 : No candy, chewing gum or throat lozenges.    FOLLOW ANY ADDITIONAL PRE OP INSTRUCTIONS YOU RECEIVED FROM YOUR SURGEON'S OFFICE!!!   Oral Hygiene is also important to reduce your risk of infection.        Remember - BRUSH YOUR TEETH THE MORNING OF SURGERY WITH YOUR REGULAR TOOTHPASTE  Do NOT smoke after Midnight the night before surgery.  METFORMIN-  Day before surgery (01-06-23) take as usual.             DAY OF YOUR SURGERY  (01-07-23):  DO NOT TAKE METFORMIN.   Take ONLY these medicines the morning of surgery with A SIP OF WATER: levothyroxine, Propranolol, amlodipine.  You may use your Albuterol inhaler if needed.   If You have been diagnosed with Sleep Apnea - Bring CPAP mask and tubing day of surgery. We will provide you with a CPAP machine on the day of your surgery.                   You may not have any metal on your body including hair pins, jewelry, and body piercing  Do not wear make-up, lotions, powders, perfumes or deodorant  Do not wear nail polish including gel and S&S, artificial / acrylic nails, or any other type of covering on natural nails including finger and toenails. If you have artificial nails, gel coating, etc., that needs to be removed by a nail salon, Please have this removed prior to surgery.  Not doing so may mean that your surgery could be cancelled or delayed if the Surgeon or anesthesia staff feels like they are unable to monitor you safely.   Do not shave 48 hours prior to surgery to avoid nicks in your skin which may contribute to postoperative infections.    Contacts, Hearing Aids, dentures or bridgework may not be worn into surgery. DENTURES WILL BE REMOVED PRIOR TO SURGERY PLEASE DO NOT APPLY "Poly grip" OR ADHESIVES!!!  You may bring a small overnight bag with you on the day of surgery, only pack items that are not valuable. [facility] IS NOT RESPONSIBLE   FOR VALUABLES THAT ARE LOST OR STOLEN.   Patients discharged on the day of surgery will not be allowed to drive home.  Someone NEEDS to stay with you for the first 24 hours after anesthesia.  Do not bring your home medications to the hospital. The Pharmacy will dispense medications listed on your medication list to you during your admission in the Hospital.  Special Instructions: Bring a copy of your healthcare power of attorney and living will documents the day of surgery, if you wish to have them scanned into your [facility]- EPIC  Please read over the following fact sheets you were given: IF YOU HAVE QUESTIONS ABOUT YOUR [place], [place] [phone].   [facility] - Preparing for Surgery Before surgery, you can play an important role.  Because skin is not sterile, your skin needs to be as free of germs as possible.  You can reduce the number of germs on your skin by washing with CHG (chlorahexidine gluconate) soap before surgery.  CHG is an antiseptic cleaner which kills germs and bonds with the skin to continue killing germs even after washing. Please DO NOT use if you have an allergy to CHG or antibacterial soaps.  If your skin becomes  reddened/irritated stop using the CHG and inform your nurse when you arrive at Short Stay. Do not shave (including legs and underarms) for at least 48  hours prior to the first CHG shower.  You may shave your face/neck.  Please follow these instructions carefully:  1.  Shower with CHG Soap the night before surgery and the  morning of surgery.  2.  If you choose to wash your hair, wash your hair first as usual with your normal  shampoo.  3.  After you shampoo, rinse your hair and body thoroughly to remove the shampoo.                             4.  Use CHG as you would any other liquid soap.  You can apply chg directly to the skin and wash.  Gently with a scrungie or clean washcloth.  5.  Apply the CHG Soap to your body ONLY FROM THE NECK DOWN.   Do not use on face/ open                           Wound or open sores. Avoid contact with eyes, ears mouth and genitals (private parts).                       Wash face,  Genitals (private parts) with your normal soap.             6.  Wash thoroughly, paying special attention to the area where your  surgery  will be performed.  7.  Thoroughly rinse your body with warm water from the neck down.  8.  DO NOT shower/wash with your normal soap after using and rinsing off the CHG Soap.            9.  Pat yourself dry with a clean towel.            10.  Wear clean pajamas.            11.  Place clean sheets on your bed the night of your first shower and do not  sleep with pets.  ON THE DAY OF SURGERY : Do not apply any lotions/deodorants the morning of surgery.  Please wear clean clothes to the hospital/surgery center.    FAILURE TO FOLLOW THESE INSTRUCTIONS MAY RESULT IN THE CANCELLATION OF YOUR SURGERY  PATIENT SIGNATURE_________________________________  NURSE SIGNATURE__________________________________  ________________________________________________________________________       Adam Phenix    An incentive spirometer is a tool that can help keep your lungs clear and active. This tool measures how well you are filling your lungs with each breath. Taking long deep breaths may  help reverse or decrease the chance of developing breathing (pulmonary) problems (especially infection) following: A long period of time when you are unable to move or be active. BEFORE THE PROCEDURE  If the spirometer includes an indicator to show your best effort, your nurse or respiratory therapist will set it to a desired goal. If possible, sit up straight or lean slightly forward. Try not to slouch. Hold the incentive spirometer in an upright position. INSTRUCTIONS FOR USE  Sit on the edge of your bed if possible, or sit up as far as you can in bed or on a chair. Hold the incentive spirometer in an upright position. Breathe out normally. Place the mouthpiece in your mouth and seal your  lips tightly around it. Breathe in slowly and as deeply as possible, raising the piston or the ball toward the top of the column. Hold your breath for 3-5 seconds or for as long as possible. Allow the piston or ball to fall to the bottom of the column. Remove the mouthpiece from your mouth and breathe out normally. Rest for a few seconds and repeat Steps 1 through 7 at least 10 times every 1-2 hours when you are awake. Take your time and take a few normal breaths between deep breaths. The spirometer may include an indicator to show your best effort. Use the indicator as a goal to work toward during each repetition. After each set of 10 deep breaths, practice coughing to be sure your lungs are clear. If you have an incision (the cut made at the time of surgery), support your incision when coughing by placing a pillow or rolled up towels firmly against it. Once you are able to get out of bed, walk around indoors and cough well. You may stop using the incentive spirometer when instructed by your caregiver.  RISKS AND COMPLICATIONS Take your time so you do not get dizzy or light-headed. If you are in pain, you may need to take or ask for pain medication before doing incentive spirometry. It is harder to take a  deep breath if you are having pain. AFTER USE Rest and breathe slowly and easily. It can be helpful to keep track of a log of your progress. Your caregiver can provide you with a simple table to help with this. If you are using the spirometer at home, follow these instructions: [place] IF:  You are having difficultly using the spirometer. You have trouble using the spirometer as often as instructed. Your pain medication is not giving enough relief while using the spirometer. You develop fever of 100.5 F (38.1 C) or higher.                                                                                                    SEEK IMMEDIATE MEDICAL CARE IF:  You cough up bloody sputum that had not been present before. You develop fever of 102 F (38.9 C) or greater. You develop worsening pain at or near the incision site. MAKE SURE YOU:  Understand these instructions. Will watch your condition. Will get help right away if you are not doing well or get worse. Document Released: 01/28/2007 Document Revised: 12/10/2011 Document Reviewed: 03/31/2007 [facility] Patient Information 2014 [place], [place].

## 2023-01-03 NOTE — Progress Notes (Signed)
COVID Vaccine received:  []  No []  Yes Date of any COVID positive Test in last 90 days:  PCP - Leeroy Cha, MD Philemon Kingdom, MD Cardiologist -   Chest x-ray - 10-09-2018  2v   Epic EKG -  will do at PST Stress Test -  ECHO -  Cardiac Cath -   PCR screen: []  Ordered & Completed           []   No Order but Needs PROFEND           [x]   N/A for this surgery  Surgery Plan:  [x]  Ambulatory                            []  Outpatient in bed                            []  Admit  Anesthesia:    []  General  []  Spinal                           [x]   Choice []   MAC  Pacemaker / ICD device [x]  No []  Yes   Spinal Cord Stimulator:[x]  No []  Yes       History of Sleep Apnea? [x]  No []  Yes   CPAP used?- []  No []  Yes    Does the patient monitor blood sugar?          [x]  No []  Yes  []  N/A  Patient has: []  NO Hx DM   []  Pre-DM                 []  DM1  [x]   DM2 Does patient have a [employer] or Dexacom? [x]  No []  Yes  tried Freestyle but could not tolerate it.  Fasting Blood Sugar Ranges-  Checks Blood Sugar _____ times a day Diabetic medications/ instructions: METFORMIN - 500mg  bid.  Do not take DOS  Blood Thinner / Instructions: none Aspirin Instructions: none  ERAS Protocol Ordered: []  No  [x]  Yes PRE-SURGERY []  ENSURE  [x]  G2  Patient is to be NPO after: 1200 noon  Comments: This case was originally scheduled at the [facility] but it had to be moved to [facility] d/t patient's BMI is 58.2.  Activity level: Patient is able / unable to climb a flight of stairs without difficulty; []  No CP  []  No SOB, but would have ___   Patient can / can not perform ADLs without assistance.   Anesthesia review: DM2, asthma, HTN, BLE lymphedema  Patient denies shortness of breath, fever, cough and chest pain at PAT appointment.  Patient verbalized understanding and agreement to the Pre-Surgical Instructions that were given to them at this PAT appointment. Patient was also educated of  the need to review these PAT instructions again prior to her surgery.I reviewed the appropriate phone numbers to call if they have any and questions or concerns.

## 2023-01-04 ENCOUNTER — Encounter (HOSPITAL_COMMUNITY): Payer: Self-pay

## 2023-01-04 ENCOUNTER — Encounter (HOSPITAL_COMMUNITY)
Admission: RE | Admit: 2023-01-04 | Discharge: 2023-01-04 | Disposition: A | Discharge status: Home / Self Care | Payer: Medicare Other | Source: Ambulatory Visit | Attending: Orthopedic Surgery | Admitting: Orthopedic Surgery

## 2023-01-04 ENCOUNTER — Other Ambulatory Visit: Payer: Self-pay

## 2023-01-04 DIAGNOSIS — I10 Essential (primary) hypertension: Secondary | ICD-10-CM | POA: Diagnosis not present

## 2023-01-04 DIAGNOSIS — E119 Type 2 diabetes mellitus without complications: Secondary | ICD-10-CM | POA: Diagnosis not present

## 2023-01-04 DIAGNOSIS — Z01818 Encounter for other preprocedural examination: Secondary | ICD-10-CM | POA: Diagnosis not present

## 2023-01-04 LAB — BASIC METABOLIC PANEL
Anion gap: 9 (ref 5–15)
BUN: 15 mg/dL (ref 8–23)
CO2: 28 mmol/L (ref 22–32)
Calcium: 9.4 mg/dL (ref 8.9–10.3)
Chloride: 103 mmol/L (ref 98–111)
Creatinine, Ser: 0.89 mg/dL (ref 0.44–1.00)
GFR, Estimated: >60 mL/min (ref >60)
Glucose, Bld: 107 mg/dL — ABNORMAL HIGH (ref 70–99)
Potassium: 3.4 mmol/L — ABNORMAL LOW (ref 3.5–5.1)
Sodium: 140 mmol/L (ref 135–145)

## 2023-01-04 LAB — GLUCOSE, CAPILLARY: Glucose-Capillary: 113 mg/dL — ABNORMAL HIGH (ref 70–99)

## 2023-01-04 LAB — HEMOGLOBIN A1C
Hgb A1c MFr Bld: 6.5 % — ABNORMAL HIGH (ref 4.8–5.6)
Mean Plasma Glucose: 139.85 mg/dL

## 2023-01-04 LAB — CBC
HCT: 41.2 % (ref 36.0–46.0)
Hemoglobin: 12.9 g/dL (ref 12.0–15.0)
MCH: 27.3 pg (ref 26.0–34.0)
MCHC: 31.3 g/dL (ref 30.0–36.0)
MCV: 87.3 fL (ref 80.0–100.0)
Platelets: 302 10*3/uL (ref 150–400)
RBC: 4.72 MIL/uL (ref 3.87–5.11)
RDW: 15.9 % — ABNORMAL HIGH (ref 11.5–15.5)
WBC: 7.3 10*3/uL (ref 4.0–10.5)
nRBC: 0 % (ref 0.0–0.2)

## 2023-01-04 NOTE — Progress Notes (Signed)
COVID Vaccine received:  []  No [x]  Yes Date of any COVID positive Test in last 90 days:  [facility]   PCP - Lorenda Ishihara, MD Internal Medicine: Carlus Pavlov, MD Cardiologist - none   Chest x-ray - 10-09-2018  2v   Epic EKG -  will do at PST Stress Test -  ECHO -  Cardiac Cath -    PCR screen: []  Ordered & Completed           []   No Order but Needs PROFEND           [x]   N/A for this surgery   Surgery Plan:  [x]  Ambulatory                            []  Outpatient in bed                            []  Admit   Anesthesia:    []  General  []  Spinal                           [x]   Choice []   MAC   Pacemaker / ICD device [x]  No []  Yes   Spinal Cord Stimulator:[x]  No []  Yes       History of Sleep Apnea? [x]  No []  Yes   CPAP used?- [x]  No []  Yes     Does the patient monitor blood sugar?          [x]  No []  Yes  []  N/A   Patient has: []  NO Hx DM   []  Pre-DM                 []  DM1  [x]   DM2 Does patient have a [employer] or Dexacom? [x]  No []  Yes  tried Freestyle but could not tolerate it.  Fasting Blood Sugar Ranges-  Checks Blood Sugar __0__ times a day Diabetic medications/ instructions: METFORMIN - 500mg  bid.  Do not take DOS   Blood Thinner / Instructions: none Aspirin Instructions: none   ERAS Protocol Ordered: []  No  [x]  Yes PRE-SURGERY []  ENSURE  [x]  G2  Patient is to be NPO after: 1200 noon   Comments: [facility] d/t patient's BMI is 58.2.   Activity level: Patient is unable to climb a flight of stairs without difficulty; []  No CP  []  No SOB, but would have ___   Patient can perform ADLs without assistance.    Anesthesia review: DM2, asthma, HTN, BLE lymphedema   Patient denies shortness of breath, fever, cough and chest pain at PAT appointment.   Patient verbalized understanding and agreement to the Pre-Surgical Instructions that were given to them at this PAT appointment.  Patient was also educated of the need to review these PAT instructions again prior to her surgery.I reviewed the appropriate phone numbers to call if they have any and questions or concerns.

## 2023-01-06 NOTE — H&P (Signed)
A pre op hand p   Chief Complaint: numbness and tingling thumb index and long finger right hand  HPI: Jennifer Huffman is a 72 y.o. female who presents for evaluation of numbness and tingling thumb index and long finger right hand. It has been present for greater than 6 months and has been worsening. She has failed conservative measures. Pain is rated as moderate.  Past Medical History:  Diagnosis Date   Asthma, mild intermittent    Blurred vision, bilateral    Diabetes mellitus without complication    type 2   Dyspnea    with exertion   History of kidney stones    Hyperlipidemia    Hypertension    Hypothyroidism    Lymphedema of both lower extremities    compressions hose when out anf wraps at home   Neuromuscular disorder    carpal tunnel   Osteoarthritis of both knees    Past Surgical History:  Procedure Laterality Date   ABDOMINAL HYSTERECTOMY     partial   COLONOSCOPY WITH PROPOFOL N/A 01/17/2017   Procedure: COLONOSCOPY WITH PROPOFOL;  Surgeon: Charlott Rakes, MD;  Location: [facility];  Service: Endoscopy;  Laterality: N/A;   COLONOSCOPY WITH PROPOFOL N/A 12/11/2022   Procedure: COLONOSCOPY WITH PROPOFOL;  Surgeon: Charlott Rakes, MD;  Location: [facility];  Service: Gastroenterology;  Laterality: N/A;   CYSTOSCOPY/RETROGRADE/URETEROSCOPY/STONE EXTRACTION WITH BASKET Right 06/30/2018   Procedure: CYSTOSCOPY/RETROGRADE/URETEROSCOPY/STONE EXTRACTION WITH BASKET INSERTION DOUBLE J STENT;  Surgeon: Bjorn Pippin, MD;  Location: [facility];  Service: Urology;  Laterality: Right;   EXTRACORPOREAL SHOCK WAVE LITHOTRIPSY Right 07/31/2018   Procedure: RIGHT EXTRACORPOREAL SHOCK WAVE LITHOTRIPSY (ESWL);  Surgeon: Heloise Purpura, MD;  Location: [facility];  Service: Urology;  Laterality: Right;   EYE SURGERY Bilateral    cataract removal  no lens   HOLMIUM LASER APPLICATION Right 06/30/2018   Procedure: HOLMIUM LASER APPLICATION;  Surgeon: Bjorn Pippin, MD;  Location: [facility];   Service: Urology;  Laterality: Right;   IR ABLATE LIVER CRYOABLATION  02/05/2020   IR ABLATE LIVER CRYOABLATION  03/11/2020   IR RADIOLOGIST EVAL & MGMT  01/21/2020   TUBAL LIGATION     URETEROSCOPY WITH HOLMIUM LASER LITHOTRIPSY Left 05/07/2018   Social History   Socioeconomic History   Marital status: Legally Separated    Spouse name: Not on file   Number of children: 3   Years of education: 12   Highest education level: Not on file  Occupational History   Occupation: retired  Tobacco Use   Smoking status: Never   Smokeless tobacco: Never  Vaping Use   Vaping Use: Never used  Substance and Sexual Activity   Alcohol use: No    Alcohol/week: 0.0 standard drinks of alcohol   Drug use: No   Sexual activity: Not Currently  Other Topics Concern   Not on file  Social History Narrative   Lives with her sister and a friend in a one story home.  Has 2 children.  Retired school bus attendant.  Education: 12.   Social Determinants of Health   Financial Resource Strain: Not on file  Food Insecurity: Not on file  Transportation Needs: Not on file  Physical Activity: Not on file  Stress: Not on file  Social Connections: Not on file   Family History  Problem Relation Age of Onset   Cancer Mother        breast   Breast cancer Mother 47   Hypertension Father    Breast cancer  Sister 29   Breast cancer Sister 20   No Known Allergies Prior to Admission medications   Medication Sig Start Date End Date Taking? Authorizing Provider  albuterol (PROVENTIL HFA;VENTOLIN HFA) 108 (90 Base) MCG/ACT inhaler Inhale 2 puffs into the lungs every 4 (four) hours as needed for wheezing or shortness of breath.   Yes [provider]  amLODipine (NORVASC) 10 MG tablet Take 10 mg by mouth in the morning.   Yes [provider]  atorvastatin (LIPITOR) 20 MG tablet Take 20 mg by mouth in the morning.   Yes [provider]  chlorthalidone (HYGROTON) 25 MG tablet Take 25 mg by  mouth in the morning. 01/09/21  Yes [provider]  levothyroxine (SYNTHROID) 150 MCG tablet Take 1 tablet (150 mcg total) by mouth daily before breakfast. 10/11/22  Yes Carlus Pavlov, MD  metFORMIN (GLUCOPHAGE) 500 MG tablet Take 1 tablet (500 mg total) by mouth 2 (two) times daily with a meal. 04/09/22  Yes Carlus Pavlov, MD  Multiple Vitamins-Minerals (MULTIVITAMIN WITH MINERALS) tablet Take 1 tablet by mouth daily. Centrum Silver   Yes [provider]  potassium chloride (KLOR-CON) 10 MEQ tablet Take 10 mEq by mouth 2 (two) times daily.   Yes [provider]  propranolol (INDERAL) 10 MG tablet Take 10 mg by mouth in the morning. 01/27/21  Yes [provider]  telmisartan (MICARDIS) 80 MG tablet Take 80 mg by mouth in the morning.   Yes [provider]  BAYER MICROLET LANCETS lancets Use as instructed used to check one time a day 05/22/16   Carlus Pavlov, MD  Cholecalciferol (VITAMIN D-3) 25 MCG (1000 UT) CAPS Take 1,000 Units by mouth in the morning.    [provider]  polyethylene glycol-electrolytes (NULYTELY) 420 g solution Take 4,000 mLs by mouth See admin instructions. Patient not taking: Reported on 01/01/2023 12/07/22   [provider]     Positive ROS: none  All other systems have been reviewed and were otherwise negative with the exception of those mentioned in the HPI and as above.  Physical Exam: There were no vitals filed for this visit.  General: Alert, no acute distress.  Morbidly obese female with BMI greater than 55 Cardiovascular: No pedal edema Respiratory: No cyanosis, no use of accessory musculature GI: No organomegaly, abdomen is soft and non-tender Skin: No lesions in the area of chief complaint Neurologic: Sensation intact distally Psychiatric: Patient is competent for consent with normal mood and affect Lymphatic: No axillary or cervical lymphadenopathy  MUSCULOSKELETAL: right hand has  positive Phalen's and Tinel's.  There is no thenar atrophy.  Assessment/Plan: RIGHT CARPAL TUNNEL SYNDROMEin a patient who is morbidly obese with BMI greater than 55 Plan for Procedure(s): CARPAL TUNNEL RELEASE  The risks benefits and alternatives were discussed with the patient including but not limited to the risks of nonoperative treatment, versus surgical intervention including infection, bleeding, nerve injury, malunion, nonunion, hardware prominence, hardware failure, need for hardware removal, blood clots, cardiopulmonary complications, morbidity, mortality, among others, and they were willing to proceed.  Predicted outcome is good, although there will be at least a six to nine month expected recovery.  Harvie Junior, MD 01/06/2023 7:12 PM

## 2023-01-07 ENCOUNTER — Ambulatory Visit (HOSPITAL_BASED_OUTPATIENT_CLINIC_OR_DEPARTMENT_OTHER): Payer: Medicare Other | Admitting: Anesthesiology

## 2023-01-07 ENCOUNTER — Ambulatory Visit (HOSPITAL_COMMUNITY)
Admission: RE | Admit: 2023-01-07 | Discharge: 2023-01-07 | Disposition: A | Discharge status: Home / Self Care | Payer: Medicare Other | Attending: Orthopedic Surgery | Admitting: Orthopedic Surgery

## 2023-01-07 ENCOUNTER — Encounter (HOSPITAL_COMMUNITY)
Admission: RE | Disposition: A | Discharge status: Home / Self Care | Payer: Self-pay | Source: Home / Self Care | Attending: Orthopedic Surgery

## 2023-01-07 ENCOUNTER — Ambulatory Visit (HOSPITAL_COMMUNITY): Payer: Medicare Other | Admitting: Physician Assistant

## 2023-01-07 ENCOUNTER — Other Ambulatory Visit: Payer: Self-pay

## 2023-01-07 ENCOUNTER — Encounter (HOSPITAL_COMMUNITY): Payer: Self-pay | Admitting: Orthopedic Surgery

## 2023-01-07 DIAGNOSIS — E039 Hypothyroidism, unspecified: Secondary | ICD-10-CM | POA: Insufficient documentation

## 2023-01-07 DIAGNOSIS — G5601 Carpal tunnel syndrome, right upper limb: Secondary | ICD-10-CM | POA: Insufficient documentation

## 2023-01-07 DIAGNOSIS — E119 Type 2 diabetes mellitus without complications: Secondary | ICD-10-CM | POA: Insufficient documentation

## 2023-01-07 DIAGNOSIS — I10 Essential (primary) hypertension: Secondary | ICD-10-CM

## 2023-01-07 DIAGNOSIS — Z6841 Body Mass Index (BMI) 40.0 and over, adult: Secondary | ICD-10-CM | POA: Insufficient documentation

## 2023-01-07 DIAGNOSIS — Z79899 Other long term (current) drug therapy: Secondary | ICD-10-CM | POA: Diagnosis not present

## 2023-01-07 DIAGNOSIS — M170 Bilateral primary osteoarthritis of knee: Secondary | ICD-10-CM | POA: Insufficient documentation

## 2023-01-07 DIAGNOSIS — J4520 Mild intermittent asthma, uncomplicated: Secondary | ICD-10-CM | POA: Insufficient documentation

## 2023-01-07 DIAGNOSIS — Z7984 Long term (current) use of oral hypoglycemic drugs: Secondary | ICD-10-CM | POA: Insufficient documentation

## 2023-01-07 DIAGNOSIS — E785 Hyperlipidemia, unspecified: Secondary | ICD-10-CM | POA: Diagnosis not present

## 2023-01-07 HISTORY — PX: CARPAL TUNNEL RELEASE: SHX101

## 2023-01-07 LAB — GLUCOSE, CAPILLARY
Glucose-Capillary: 142 mg/dL — ABNORMAL HIGH (ref 70–99)
Glucose-Capillary: 97 mg/dL (ref 70–99)

## 2023-01-07 SURGERY — CARPAL TUNNEL RELEASE
Anesthesia: Monitor Anesthesia Care | Laterality: Right

## 2023-01-07 MED ORDER — ORAL CARE MOUTH RINSE: 15.0000 mL | Freq: Once | OROMUCOSAL | Status: AC

## 2023-01-07 MED ORDER — LIDOCAINE HCL (PF) 1 % IJ SOLN
INTRAMUSCULAR | Status: DC | PRN
  Administered 2023-01-07: 5 mL

## 2023-01-07 MED ORDER — PROPOFOL 10 MG/ML IV BOLUS
INTRAVENOUS | Status: AC
  Filled 2023-01-07: qty 20

## 2023-01-07 MED ORDER — FENTANYL CITRATE (PF) 100 MCG/2ML IJ SOLN
INTRAMUSCULAR | Status: DC | PRN
  Administered 2023-01-07 (×2): 25 ug via INTRAVENOUS

## 2023-01-07 MED ORDER — FENTANYL CITRATE (PF) 100 MCG/2ML IJ SOLN
INTRAMUSCULAR | Status: AC
  Filled 2023-01-07: qty 2

## 2023-01-07 MED ORDER — CEFAZOLIN IN SODIUM CHLORIDE 3-0.9 GM/100ML-% IV SOLN
3.0000 g | INTRAVENOUS | Status: AC
  Administered 2023-01-07: 3 g via INTRAVENOUS
  Filled 2023-01-07: qty 100

## 2023-01-07 MED ORDER — PROPOFOL 10 MG/ML IV BOLUS
INTRAVENOUS | Status: DC | PRN
  Administered 2023-01-07: 10 mg via INTRAVENOUS
  Administered 2023-01-07: 20 mg via INTRAVENOUS
  Administered 2023-01-07: 10 mg via INTRAVENOUS

## 2023-01-07 MED ORDER — LIDOCAINE HCL (PF) 1 % IJ SOLN
INTRAMUSCULAR | Status: AC
  Filled 2023-01-07: qty 30

## 2023-01-07 MED ORDER — BUPIVACAINE HCL (PF) 0.25 % IJ SOLN
INTRAMUSCULAR | Status: AC
  Filled 2023-01-07: qty 30

## 2023-01-07 MED ORDER — HYDROCODONE-ACETAMINOPHEN 5-325 MG PO TABS: 1.0000 | ORAL_TABLET | Freq: Four times a day (QID) | ORAL | 0 refills | Status: AC | PRN

## 2023-01-07 MED ORDER — LACTATED RINGERS IV SOLN: INTRAVENOUS | Status: DC

## 2023-01-07 MED ORDER — PROPOFOL 500 MG/50ML IV EMUL
INTRAVENOUS | Status: DC | PRN
  Administered 2023-01-07: 100 ug/kg/min via INTRAVENOUS

## 2023-01-07 MED ORDER — PROPOFOL 1000 MG/100ML IV EMUL
INTRAVENOUS | Status: AC
  Filled 2023-01-07: qty 100

## 2023-01-07 MED ORDER — FENTANYL CITRATE PF 50 MCG/ML IJ SOSY: 25.0000 ug | PREFILLED_SYRINGE | INTRAMUSCULAR | Status: DC | PRN

## 2023-01-07 MED ORDER — AMISULPRIDE (ANTIEMETIC) 5 MG/2ML IV SOLN: 10.0000 mg | Freq: Once | INTRAVENOUS | Status: DC | PRN

## 2023-01-07 MED ORDER — 0.9 % SODIUM CHLORIDE (POUR BTL) OPTIME
TOPICAL | Status: DC | PRN
  Administered 2023-01-07: 1000 mL

## 2023-01-07 MED ORDER — CHLORHEXIDINE GLUCONATE 0.12 % MT SOLN
15.0000 mL | Freq: Once | OROMUCOSAL | Status: AC
  Administered 2023-01-07: 15 mL via OROMUCOSAL

## 2023-01-07 MED ORDER — OXYCODONE HCL 5 MG PO TABS: 5.0000 mg | ORAL_TABLET | Freq: Once | ORAL | Status: DC | PRN

## 2023-01-07 MED ORDER — OXYCODONE HCL 5 MG/5ML PO SOLN: 5.0000 mg | Freq: Once | ORAL | Status: DC | PRN

## 2023-01-07 MED ORDER — LIDOCAINE-EPINEPHRINE 1 %-1:100000 IJ SOLN
INTRAMUSCULAR | Status: AC
  Filled 2023-01-07: qty 1

## 2023-01-07 MED ORDER — BUPIVACAINE HCL 0.25 % IJ SOLN
INTRAMUSCULAR | Status: DC | PRN
  Administered 2023-01-07: 5 mL

## 2023-01-07 SURGICAL SUPPLY — 40 items
BAG COUNTER SPONGE SURGICOUNT (BAG) IMPLANT
BAG SPNG CNTER NS LX DISP (BAG)
BNDG CMPR 5X3 KNIT ELC UNQ LF (GAUZE/BANDAGES/DRESSINGS) ×1
BNDG CMPR 9X4 STRL LF SNTH (GAUZE/BANDAGES/DRESSINGS) ×1
BNDG CMPR STD VLCR NS LF 5.8X3 (GAUZE/BANDAGES/DRESSINGS) ×1
BNDG ELASTIC 3INX 5YD STR LF (GAUZE/BANDAGES/DRESSINGS) ×1 IMPLANT
BNDG ELASTIC 3X5.8 VLCR NS LF (GAUZE/BANDAGES/DRESSINGS) IMPLANT
BNDG ESMARK 4X9 LF (GAUZE/BANDAGES/DRESSINGS) ×1 IMPLANT
CORD BIPOLAR FORCEPS 12FT (ELECTRODE) ×1 IMPLANT
COVER SURGICAL LIGHT HANDLE (MISCELLANEOUS) ×1 IMPLANT
CUFF TOURN SGL QUICK 18X4 (TOURNIQUET CUFF) ×1 IMPLANT
CUFF TOURN SGL QUICK 24 (TOURNIQUET CUFF) ×1
CUFF TRNQT CYL 24X4X16.5-23 (TOURNIQUET CUFF) ×1 IMPLANT
DRAPE U-SHAPE 47X51 STRL (DRAPES) ×1 IMPLANT
DRSG EMULSION OIL 3X3 NADH (GAUZE/BANDAGES/DRESSINGS) ×1 IMPLANT
DURAPREP 26ML APPLICATOR (WOUND CARE) ×1 IMPLANT
GAUZE SPONGE 4X4 12PLY STRL (GAUZE/BANDAGES/DRESSINGS) ×1 IMPLANT
GLOVE BIOGEL PI IND STRL 8 (GLOVE) ×2 IMPLANT
GLOVE ECLIPSE 7.5 STRL STRAW (GLOVE) ×2 IMPLANT
GOWN STRL REUS W/ TWL LRG LVL3 (GOWN DISPOSABLE) ×1 IMPLANT
GOWN STRL REUS W/ TWL XL LVL3 (GOWN DISPOSABLE) ×1 IMPLANT
GOWN STRL REUS W/TWL LRG LVL3 (GOWN DISPOSABLE) ×1
GOWN STRL REUS W/TWL XL LVL3 (GOWN DISPOSABLE) ×1
KIT BASIN OR (CUSTOM PROCEDURE TRAY) ×1 IMPLANT
KIT TURNOVER KIT B (KITS) ×1 IMPLANT
MANIFOLD NEPTUNE II (INSTRUMENTS) ×1 IMPLANT
NDL HYPO 25X1 1.5 SAFETY (NEEDLE) ×1 IMPLANT
NEEDLE HYPO 25X1 1.5 SAFETY (NEEDLE) ×1 IMPLANT
PACK ORTHO EXTREMITY (CUSTOM PROCEDURE TRAY) ×1 IMPLANT
PAD ARMBOARD 7.5X6 YLW CONV (MISCELLANEOUS) ×2 IMPLANT
PAD CAST 3X4 CTTN HI CHSV (CAST SUPPLIES) ×1 IMPLANT
PADDING CAST COTTON 3X4 STRL (CAST SUPPLIES) ×1
PENCIL SMOKE EVACUATOR (MISCELLANEOUS) IMPLANT
SPLINT PLASTER GYPS XFAST 3X15 (CAST SUPPLIES) ×1 IMPLANT
SUCTION FRAZIER HANDLE 10FR (MISCELLANEOUS)
SUCTION TUBE FRAZIER 10FR DISP (MISCELLANEOUS) IMPLANT
SUT ETHILON 4 0 PS 2 18 (SUTURE) ×1 IMPLANT
SYR CONTROL 10ML LL (SYRINGE) IMPLANT
TOWEL GREEN STERILE (TOWEL DISPOSABLE) ×1 IMPLANT
TUBE CONNECTING 12X1/4 (SUCTIONS) ×1 IMPLANT

## 2023-01-07 NOTE — Discharge Instructions (Signed)
Elevate your right hand above your heart is much as possible. You may move your fingers as tolerated. Use your pain medicine as prescribed. Keep the dressing/splint in place until the follow-up in the office.  Keep the dressing dry. Call if any questions or problems.  [phone].

## 2023-01-07 NOTE — Anesthesia Preprocedure Evaluation (Addendum)
Anesthesia Evaluation  Patient identified by MRN, date of birth, ID band Patient awake    Reviewed: Allergy & Precautions, NPO status , Patient's Chart, lab work & pertinent test results  History of Anesthesia Complications Negative for: history of anesthetic complications  Airway Mallampati: III       Dental  (+) Dental Advisory Given   Pulmonary neg shortness of breath, asthma , neg sleep apnea, neg COPD, neg recent URI   Pulmonary exam normal breath sounds clear to auscultation       Cardiovascular hypertension (amlodipine, telmisartan, chlorthalidone, propranolol), Pt. on medications and Pt. on home beta blockers (-) angina (-) Past MI, (-) Cardiac Stents and (-) CABG (-) dysrhythmias  Rhythm:Regular Rate:Normal  HLD   Neuro/Psych neg Seizures negative neurological ROS     GI/Hepatic negative GI ROS, Neg liver ROS,,,  Endo/Other  diabetes (Hgb A1c 6.5), Well Controlled, Type 2, Oral Hypoglycemic AgentsHypothyroidism  Morbid obesity  Renal/GU negative Renal ROS     Musculoskeletal  (+) Arthritis , Osteoarthritis,    Abdominal  (+) + obese  Peds  Hematology negative hematology ROS (+)   Anesthesia Other Findings Lymphedema of BLE  Reproductive/Obstetrics                             Anesthesia Physical Anesthesia Plan  ASA: 3  Anesthesia Plan: MAC   Post-op Pain Management:    Induction: Intravenous  PONV Risk Score and Plan: 2 and Ondansetron, Dexamethasone and Treatment may vary due to age or medical condition  Airway Management Planned: Natural Airway and Simple Face Mask  Additional Equipment:   Intra-op Plan:   Post-operative Plan:   Informed Consent: I have reviewed the patients History and Physical, chart, labs and discussed the procedure including the risks, benefits and alternatives for the proposed anesthesia with the patient or authorized representative who has  indicated his/her understanding and acceptance.       Plan Discussed with: Anesthesiologist and CRNA  Anesthesia Plan Comments: (Discussed with patient risks of MAC including, but not limited to, minor pain or discomfort, hearing people in the room, and possible need for backup general anesthesia. Risks for general anesthesia also discussed including, but not limited to, sore throat, hoarse voice, chipped/damaged teeth, injury to vocal cords, nausea and vomiting, allergic reactions, lung infection, heart attack, stroke, and death. All questions answered. )        Anesthesia Quick Evaluation

## 2023-01-07 NOTE — Interval H&P Note (Signed)
History and Physical Interval Note:  01/07/2023 2:24 PM  Jennifer Huffman  has presented today for surgery, with the diagnosis of RIGHT CARPAL TUNNEL SYNDROME.  The various methods of treatment have been discussed with the patient and family. After consideration of risks, benefits and other options for treatment, the patient has consented to  Procedure(s): CARPAL TUNNEL RELEASE (Right) as a surgical intervention.  The patient's history has been reviewed, patient examined, no change in status, stable for surgery.  I have reviewed the patient's chart and labs.  Questions were answered to the patient's satisfaction.     Harvie Junior

## 2023-01-07 NOTE — Anesthesia Postprocedure Evaluation (Signed)
Anesthesia Post Note  Patient: Jennifer Huffman  Procedure(s) Performed: CARPAL TUNNEL RELEASE (Right)     Patient location during evaluation: PACU Anesthesia Type: MAC Level of consciousness: awake and alert Pain management: pain level controlled Vital Signs Assessment: post-procedure vital signs reviewed and stable Respiratory status: spontaneous breathing, nonlabored ventilation and respiratory function stable Cardiovascular status: blood pressure returned to baseline Postop Assessment: no apparent nausea or vomiting Anesthetic complications: no   No notable events documented.  Last Vitals:  Vitals:   01/07/23 1615 01/07/23 1630  BP: (!) 132/90 (!) 170/75  Pulse: 72 64  Resp: 12 19  Temp:    SpO2: 96% 96%    Last Pain:  Vitals:   01/07/23 1630  TempSrc:   PainSc: 0-No pain                 Shanda Howells

## 2023-01-07 NOTE — Transfer of Care (Signed)
Immediate Anesthesia Transfer of Care Note  Patient: Jennifer Huffman  Procedure(s) Performed: Procedure(s): CARPAL TUNNEL RELEASE (Right)  Patient Location: PACU  Anesthesia Type:MAC  Level of Consciousness: awake, alert  and oriented  Airway & Oxygen Therapy: Patient Spontanous Breathing and Patient connected to nasal cannula oxygen  Post-op Assessment: Report given to RN and Post -op Vital signs reviewed and stable  Post vital signs: Reviewed and stable  Last Vitals:  Vitals:   01/07/23 1258  BP: (!) 145/89  Pulse: 70  Resp: (!) 21  Temp: 36.7 C  SpO2: 97%    Complications: No apparent anesthesia complications

## 2023-01-08 ENCOUNTER — Encounter (HOSPITAL_COMMUNITY): Payer: Self-pay | Admitting: Orthopedic Surgery

## 2023-01-08 NOTE — Op Note (Signed)
PATIENT ID:      Jennifer Huffman  MRN:     [id number] DOB/AGE:    [birth year] / 72 y.o.       OPERATIVE REPORT    DATE OF PROCEDURE:  01/08/2023       PREOPERATIVE DIAGNOSIS:   RIGHT CARPAL TUNNEL SYNDROME      Estimated body mass index is 58.57 kg/m as calculated from the following:   Height as of this encounter: 5\' 1"  (1.549 m).   Weight as of this encounter: 140.6 kg.                                                        POSTOPERATIVE DIAGNOSIS:   RIGHT CARPAL TUNNEL SYNDROME                                                                      PROCEDURE:  Procedure(s):Right CARPAL TUNNEL RELEASE     SURGEON: Harvie Junior    ASSISTANT:   JJim Bethune PA-C   (Present and scrubbed throughout the case, critical for assistance with exposure, retraction, and closure.)         ANESTHESIA: GET    TOURNIQUET TIME:   COMPLICATIONS:  None        SPECIMENS: None   INDICATIONS FOR PROCEDURE: The patient has Right CTS . Pain wakes patient up at least 5 of 7 nights per week with numbness to the fingertips. Patient is failed conservative measures with observation anti-inflammatory medicine and night splints. Risks and benefits of surgery have been discussed, questions answered.    A tourniquet was placed on the forearm. The upper extremity was prepped and draped in the usual sterile fashion from the tourniquet to the fingertips. A timeout procedure was performed.The hand and forearm were wrapped with an Esmarch bandage and the tourniquet inflated to Hg. A longitudinal palmar incision starting at the wrist flexion crease going distally for 4 cm just to the ulnar-sided the thenar crease was made through the skin and subcutaneous tissue. With traction on the skin edges, we then cut down into the carpal tunnel distally, passed a Freer elevator into the carpal tunnel palmarly and cut down on the elevator performing the carpal tunnel release. The fascial incision was extended into the  forearm with the black handled scissors for 2-3cm.We then examined the tendons and the median nerve which had an hourglass appearance but was intact. There were no ganglia or masses in the carpal tunnel. The wound was irrigated out normal saline solution and the skin only closed with running 4-0 nylon suture. A dressing of Xerofoam, 4 x 4 dressing sponges, 2 inch web roll and 2 inch Ace wrap was applied and the Esmarch tourniquet removed for a tourniquet time of 10 minutes. At this point, a sling was applied, the patient was laid supine awakened extubated and taken to the recovery room. Gus Puma was spent an entire case and assisted by retraction of tissues and closing minimize or time

## 2023-02-08 ENCOUNTER — Other Ambulatory Visit: Payer: Self-pay | Admitting: Internal Medicine

## 2023-02-08 DIAGNOSIS — E785 Hyperlipidemia, unspecified: Secondary | ICD-10-CM

## 2023-02-26 DIAGNOSIS — Z9889 Other specified postprocedural states: Secondary | ICD-10-CM | POA: Diagnosis not present

## 2023-04-11 ENCOUNTER — Encounter: Payer: Self-pay | Admitting: Internal Medicine

## 2023-04-11 ENCOUNTER — Ambulatory Visit: Payer: Medicare Other | Admitting: Internal Medicine

## 2023-04-11 VITALS — BP 130/80 | HR 79 | Ht 61.0 in | Wt 306.6 lb

## 2023-04-11 DIAGNOSIS — Z7984 Long term (current) use of oral hypoglycemic drugs: Secondary | ICD-10-CM | POA: Diagnosis not present

## 2023-04-11 DIAGNOSIS — E785 Hyperlipidemia, unspecified: Secondary | ICD-10-CM | POA: Diagnosis not present

## 2023-04-11 DIAGNOSIS — E119 Type 2 diabetes mellitus without complications: Secondary | ICD-10-CM

## 2023-04-11 DIAGNOSIS — E039 Hypothyroidism, unspecified: Secondary | ICD-10-CM

## 2023-04-11 LAB — HEMOGLOBIN A1C: Hemoglobin A1C: 6.2

## 2023-04-11 MED ORDER — METFORMIN HCL 500 MG PO TABS: 500.0000 mg | ORAL_TABLET | Freq: Two times a day (BID) | ORAL | 3 refills | Status: DC

## 2023-04-11 NOTE — Patient Instructions (Addendum)
Please continue: - Metformin 500 mg 2x a day with meals.   Try to check sugars every day or every other day.   Continue levothyroxine 150 mcg daily.   Take the thyroid hormone every day, with water, at least 30 minutes before breakfast, separated by at least 4 hours from: - acid reflux medications - calcium - iron - multivitamins              Please return in 6 months with your sugar log.

## 2023-04-11 NOTE — Progress Notes (Signed)
Patient ID: Jennifer Huffman, female   DOB: 1951/09/05, 72 y.o.   MRN: [id number]  HPI: Jennifer Huffman is a 72 y.o.-year-old female, returning for f/u for DM2, dx in 07/2021 (previously prediabetes dx in 2014), controlled uncontrolled, without complications. She moved from [place] in 08/2015. Last visit 6 months ago.  Interim history: No increased urination, blurry vision, nausea, chest pain. She lost 9 lbs before last visit and 6 more since then.   She continues to have leg swelling - B legs wrapped. She had R carpal tunnel release surgery in 12/2022.  She feels better after the surgery.  Prediabetes:  Reviewed HbA1c levels:  Lab Results  Component Value Date   HGBA1C 6.5 (H) 01/04/2023   HGBA1C 6.3 (A) 10/11/2022   HGBA1C 6.6 (A) 04/09/2022  04/10/2021: HbA1c 6.5% 05/22/2018: HbA1c 5.8% 10/31/2015: HbA1c 6.5%  Pt is on: - Metformin 500 mg 2x a day started 08/2017.  Denies GI side effects.  She continues to not check sugars at home, despite multiple promptings. She tried a freestyle libre CGM in the past but this irritated her skin so she stopped it.  Glucometer: Contour Next  -No CKD, last BUN/creatinine:  Lab Results  Component Value Date   BUN 15 01/04/2023   Lab Results  Component Value Date   CREATININE 0.89 01/04/2023  On telmisartan.  -+ HL; latest lipids: 04/13/2022: 173/145/49/98 04/10/2021: 198/155/45/126 Lab Results  Component Value Date   CHOL 172 05/26/2020   HDL 50.30 05/26/2020   LDLCALC 98 05/26/2020   TRIG 118.0 05/26/2020   CHOLHDL 3 05/26/2020  04/07/2019: 191/119/55/112 04/01/2018: 195/105/60/114 10/31/2015: 175/82/50/104 On atorvastatin 80.  -Latest eye exam 2024: reportedly No DR; she had cataract surgeries in 02 and 11/2018.  -no numbness and tingling in her feet. Last foot exam 11/20/2021.  On ASA 81.  Hypothyroidism:  Reviewed her TFTs: 04/13/2022: TSH 1.56 Lab Results  Component Value Date   TSH 3.48 07/19/2021   TSH  3.16 05/26/2020   TSH 3.08 05/26/2019   TSH 3.08 07/04/2017   TSH 1.27 12/21/2016  04/01/2018: TSH 3.80 03/06/2016: TSH 4.74 10/31/2015: TSH 4.36 (0.34-4.5)  Pt is on levothyroxine 150 mcg daily, taken: - in am - fasting - at least 30 min from b'fast - no calcium - no iron - + multivitamins later in the day - no PPIs - not on Biotin  She had a kidney stone removed - 05/08/2018.  She has a history of HTN, lymphedema, OA of her knees, mild intermittent asthma, eczema.   ROS: + See HPI  I reviewed pt's medications, allergies, PMH, social hx, family hx, and changes were documented in the history of present illness. Otherwise, unchanged from my initial visit note.  Past Medical History:  Diagnosis Date   Asthma, mild intermittent    Blurred vision, bilateral    Diabetes mellitus without complication (HCC)    type 2   Dyspnea    with exertion   History of kidney stones    Hyperlipidemia    Hypertension    Hypothyroidism    Lymphedema of both lower extremities    compressions hose when out anf wraps at home   Neuromuscular disorder (HCC)    carpal tunnel   Osteoarthritis of both knees    Past Surgical History:  Procedure Laterality Date   ABDOMINAL HYSTERECTOMY     partial   CARPAL TUNNEL RELEASE Right 01/07/2023   Procedure: CARPAL TUNNEL RELEASE;  Surgeon: Jodi Geralds, MD;  Location: [facility];  Service:  Orthopedics;  Laterality: Right;   COLONOSCOPY WITH PROPOFOL N/A 01/17/2017   Procedure: COLONOSCOPY WITH PROPOFOL;  Surgeon: Charlott Rakes, MD;  Location: [facility];  Service: Endoscopy;  Laterality: N/A;   COLONOSCOPY WITH PROPOFOL N/A 12/11/2022   Procedure: COLONOSCOPY WITH PROPOFOL;  Surgeon: Charlott Rakes, MD;  [facility]: [facility];  Service: Gastroenterology;  Laterality: N/A;   CYSTOSCOPY/RETROGRADE/URETEROSCOPY/STONE EXTRACTION WITH BASKET Right 06/30/2018   Procedure: CYSTOSCOPY/RETROGRADE/URETEROSCOPY/STONE EXTRACTION WITH BASKET INSERTION DOUBLE J  STENT;  Surgeon: Bjorn Pippin, MD;  Location: [facility];  Service: Urology;  Laterality: Right;   EXTRACORPOREAL SHOCK WAVE LITHOTRIPSY Right 07/31/2018   Procedure: RIGHT EXTRACORPOREAL SHOCK WAVE LITHOTRIPSY (ESWL);  Surgeon: Heloise Purpura, MD;  Location: [facility];  Service: Urology;  Laterality: Right;   EYE SURGERY Bilateral    cataract removal  no lens   HOLMIUM LASER APPLICATION Right 06/30/2018   Procedure: HOLMIUM LASER APPLICATION;  Surgeon: Bjorn Pippin, MD;  [facility]: [facility];  Service: Urology;  Laterality: Right;   IR ABLATE LIVER CRYOABLATION  02/05/2020   IR ABLATE LIVER CRYOABLATION  03/11/2020   IR RADIOLOGIST EVAL & MGMT  01/21/2020   TUBAL LIGATION     URETEROSCOPY WITH HOLMIUM LASER LITHOTRIPSY Left 05/07/2018   Social History   Social History   Marital Status: Legally Separated    Spouse Name: N/A   Number of Children: 2   Occupational History   retired   Social History Main Topics   Smoking status: Never Smoker    Smokeless tobacco: Never Used   Alcohol Use: Vodka 1-2 drinks socially   Drug Use: No   Current Outpatient Medications on File Prior to Visit  Medication Sig Dispense Refill   albuterol (PROVENTIL HFA;VENTOLIN HFA) 108 (90 Base) MCG/ACT inhaler Inhale 2 puffs into the lungs every 4 (four) hours as needed for wheezing or shortness of breath.     amLODipine (NORVASC) 10 MG tablet Take 10 mg by mouth in the morning.     atorvastatin (LIPITOR) 20 MG tablet TAKE 1 TABLET BY MOUTH EVERY DAY 90 tablet 1   BAYER MICROLET LANCETS lancets Use as instructed used to check one time a day 100 each 5   chlorthalidone (HYGROTON) 25 MG tablet Take 25 mg by mouth in the morning.     Cholecalciferol (VITAMIN D-3) 25 MCG (1000 UT) CAPS Take 1,000 Units by mouth in the morning.     HYDROcodone-acetaminophen (NORCO/VICODIN) 5-325 MG tablet Take 1 tablet by mouth every 6 (six) hours as needed for moderate pain (for post op pain). 20 tablet 0   levothyroxine (SYNTHROID) 150 MCG  tablet Take 1 tablet (150 mcg total) by mouth daily before breakfast. 90 tablet 3   metFORMIN (GLUCOPHAGE) 500 MG tablet Take 1 tablet (500 mg total) by mouth 2 (two) times daily with a meal. 180 tablet 3   Multiple Vitamins-Minerals (MULTIVITAMIN WITH MINERALS) tablet Take 1 tablet by mouth daily. Centrum Silver     potassium chloride (KLOR-CON) 10 MEQ tablet Take 10 mEq by mouth 2 (two) times daily.     propranolol (INDERAL) 10 MG tablet Take 10 mg by mouth in the morning.     telmisartan (MICARDIS) 80 MG tablet Take 80 mg by mouth in the morning.     No current facility-administered medications on file prior to visit.   No Known Allergies Family History  Problem Relation Age of Onset   Cancer Mother        breast   Breast cancer Mother 39   Hypertension  Father    Breast cancer Sister 71   Breast cancer Sister 12   PE: BP 130/80   Pulse 79   Ht 5\' 1"  (1.549 m)   Wt (!) 306 lb 9.6 oz (139.1 kg)   SpO2 95%   BMI 57.93 kg/m  Wt Readings from Last 3 Encounters:  04/11/23 (!) 306 lb 9.6 oz (139.1 kg)  01/07/23 (!) 310 lb (140.6 kg)  01/04/23 (!) 310 lb (140.6 kg)   Constitutional: obese, in NAD, walks with a walker Eyes: EOMI, no exophthalmos ENT: no thyromegaly, no cervical lymphadenopathy Cardiovascular: RRR, No MRG + bilateral significant lymphedema in LE R>L Respiratory: CTA B Musculoskeletal: no deformities Skin: no rashes Neurological: + Slight tremor with outstretched hands Diabetic Foot Exam - Simple   Simple Foot Form Diabetic Foot exam was performed with the following findings: Yes 04/11/2023  1:21 PM  Visual Inspection See comments: Yes Sensation Testing Intact to touch and monofilament testing bilaterally: Yes Pulse Check Posterior Tibialis and Dorsalis pulse intact bilaterally: Yes Comments B pitting edema Nail onychodystrophy, thick nails     ASSESSMENT: 1.  Diabetes type 2  2. Hypothyroidism  3.  HL  PLAN:  1. Patient with controlled diabetes,  on low-dose metformin.  She has no side effects from metformin.  Over the years that I have been seeing her, I was not able to convince her to check blood sugars despite explanation of why this is necessary.  Therefore, we are treating her based only on the HbA1c, which is not ideal.  Latest HbA1c was higher, 6.5%, increased from 6.3% at last visit. - at today's visit, she is still not checking CBGs and also declines a CGM as she feels that this may not be covered by her insurance.  However, HbA1c is lower than before, so for now, I advised her to continue the current regimen (refilled her metformin). - I suggested to:  Patient Instructions  Please continue: - Metformin 500 mg 2x a day with meals.   Try to check sugars every day or every other day.   Continue levothyroxine 150 mcg daily.   Take the thyroid hormone every day, with water, at least 30 minutes before breakfast, separated by at least 4 hours from: - acid reflux medications - calcium - iron - multivitamins              Please return in 6 months with your sugar log.   - we checked her HbA1c: 6.2% (lower) - advised to check sugars at different times of the day - 4x a day, rotating check times - advised for yearly eye exams >> she is UTD - she is due for annual labs.  She prefers to get these checked next month at her annual physical exam with PCP - return to clinic in 6 months  2. Hypothyroidism - latest thyroid labs reviewed with pt. >> normal in 03/2022 - she continues on LT4 150 mcg daily - pt feels good on this dose. - we discussed about taking the thyroid hormone every day, with water, >30 minutes before breakfast, separated by >4 hours from acid reflux medications, calcium, iron, multivitamins. Pt. is taking it correctly. - will check thyroid tests at her [facility] in August per her preference  3.  HL -Reviewed latest lipid panel from 03/2022: LDL above target, but improved, otherwise fractions at goal -she is on Lipitor 80  mg daily without side effects -she is due for a lipid panel-see above  Carlus Pavlov, MD  PhD [facility] Endocrinology

## 2023-05-18 ENCOUNTER — Other Ambulatory Visit: Payer: Self-pay | Admitting: Internal Medicine

## 2023-05-18 DIAGNOSIS — E785 Hyperlipidemia, unspecified: Secondary | ICD-10-CM

## 2023-07-05 DIAGNOSIS — E1169 Type 2 diabetes mellitus with other specified complication: Secondary | ICD-10-CM | POA: Diagnosis not present

## 2023-07-05 DIAGNOSIS — R3121 Asymptomatic microscopic hematuria: Secondary | ICD-10-CM | POA: Diagnosis not present

## 2023-07-05 DIAGNOSIS — E785 Hyperlipidemia, unspecified: Secondary | ICD-10-CM | POA: Diagnosis not present

## 2023-07-05 DIAGNOSIS — D49512 Neoplasm of unspecified behavior of left kidney: Secondary | ICD-10-CM | POA: Diagnosis not present

## 2023-07-05 DIAGNOSIS — F4010 Social phobia, unspecified: Secondary | ICD-10-CM | POA: Diagnosis not present

## 2023-07-05 DIAGNOSIS — L84 Corns and callosities: Secondary | ICD-10-CM | POA: Diagnosis not present

## 2023-07-05 DIAGNOSIS — I10 Essential (primary) hypertension: Secondary | ICD-10-CM | POA: Diagnosis not present

## 2023-07-05 DIAGNOSIS — J4520 Mild intermittent asthma, uncomplicated: Secondary | ICD-10-CM | POA: Diagnosis not present

## 2023-07-05 DIAGNOSIS — Z0000 Encounter for general adult medical examination without abnormal findings: Secondary | ICD-10-CM | POA: Diagnosis not present

## 2023-07-05 DIAGNOSIS — Z1331 Encounter for screening for depression: Secondary | ICD-10-CM | POA: Diagnosis not present

## 2023-07-05 DIAGNOSIS — N200 Calculus of kidney: Secondary | ICD-10-CM | POA: Diagnosis not present

## 2023-07-05 DIAGNOSIS — Z23 Encounter for immunization: Secondary | ICD-10-CM | POA: Diagnosis not present

## 2023-07-05 DIAGNOSIS — E559 Vitamin D deficiency, unspecified: Secondary | ICD-10-CM | POA: Diagnosis not present

## 2023-07-05 DIAGNOSIS — E039 Hypothyroidism, unspecified: Secondary | ICD-10-CM | POA: Diagnosis not present

## 2023-07-11 DIAGNOSIS — E1169 Type 2 diabetes mellitus with other specified complication: Secondary | ICD-10-CM | POA: Diagnosis not present

## 2023-07-16 ENCOUNTER — Ambulatory Visit (INDEPENDENT_AMBULATORY_CARE_PROVIDER_SITE_OTHER): Payer: Medicare Other | Admitting: Podiatry

## 2023-07-16 DIAGNOSIS — E1151 Type 2 diabetes mellitus with diabetic peripheral angiopathy without gangrene: Secondary | ICD-10-CM | POA: Diagnosis not present

## 2023-07-16 DIAGNOSIS — M79675 Pain in left toe(s): Secondary | ICD-10-CM | POA: Diagnosis not present

## 2023-07-16 DIAGNOSIS — B353 Tinea pedis: Secondary | ICD-10-CM

## 2023-07-16 DIAGNOSIS — M79674 Pain in right toe(s): Secondary | ICD-10-CM | POA: Diagnosis not present

## 2023-07-16 DIAGNOSIS — B351 Tinea unguium: Secondary | ICD-10-CM | POA: Diagnosis not present

## 2023-07-16 DIAGNOSIS — R601 Generalized edema: Secondary | ICD-10-CM

## 2023-07-16 MED ORDER — KETOCONAZOLE 2 % EX CREA: 1.0000 | TOPICAL_CREAM | Freq: Every day | CUTANEOUS | 2 refills | Status: DC

## 2023-07-16 NOTE — Progress Notes (Signed)
Subjective:  Patient ID: Jennifer Huffman, female    DOB: 08-08-1951,  MRN: [id number]  Verner Chol presents to clinic today for:  Chief Complaint  Patient presents with   Nail Problem    Pt presents for Diabetic FOOT Care, pt is diabetic.   Patient notes nails are thick and elongated, causing pain in shoe gear when ambulating.  Patient notes some abnormal skin texture and itching to the sides of her feet.  Denies any drainage.  She uses a walker to assist with ambulation.    PCP is Lorenda Ishihara, MD.  Past Medical History:  Diagnosis Date   Asthma, mild intermittent    Blurred vision, bilateral    Diabetes mellitus without complication (HCC)    type 2   Dyspnea    with exertion   History of kidney stones    Hyperlipidemia    Hypertension    Hypothyroidism    Lymphedema of both lower extremities    compressions hose when out anf wraps at home   Neuromuscular disorder (HCC)    carpal tunnel   Osteoarthritis of both knees     No Known Allergies  Objective:  [name] is a pleasant 72 y.o. female in NAD. AAO x 3.  Vascular Examination: Patient has palpable DP pulse, absent PT pulse bilateral.  Delayed capillary refill bilateral toes.  Sparse digital hair bilateral.  Proximal to distal cooling WNL bilateral.  +2 pitting edema bilateral lower legs and ankles.  Dermatological Examination: Interspaces are clear with no open lesions noted bilateral.  Skin is shiny and atrophic bilateral.  Nails are 3-33mm thick, with yellowish/brown discoloration, subungual debris and distal onycholysis x10.  There is pain with compression of nails x10.    Neurological Examination: Protective sensation intact b/l LE. Patient has a wide base of gait.     Latest Ref Rng & Units 01/04/2023   11:01 AM 10/11/2022    1:05 PM  Hemoglobin A1C  Hemoglobin-A1c 4.8 - 5.6 % 6.5  6.3    Patient qualifies for at-risk foot care because of diabetes with  PVD.  Assessment/Plan: 1. Pain due to onychomycosis of toenails of both feet   2. Type II diabetes mellitus with peripheral circulatory disorder (HCC)   3. Generalized edema   4. Tinea pedis of both feet     Meds ordered this encounter  Medications   ketoconazole (NIZORAL) 2 % cream    Sig: Apply 1 Application topically daily. Apply 1gm to skin on bottom and sides of feet daily    Dispense:  60 g    Refill:  2   FOR HOME USE ONLY DME DIABETIC SHOE  Mycotic nails x10 were sharply debrided with sterile nail nippers and power debriding burr to decrease bulk and length.  Patient would benefit from diabetic shoes with diabetic insoles for her feet due to her diabetes with PVD and generalized edema of the ankles and feet.  Consultation with our pedorthist was made today.  She will be scheduled for this appointment within the next couple of weeks.  Return in about 3 months (around 10/16/2023) for [facility].   Clerance Lav, DPM, FACFAS [facility]     [address], [place] [address]  Office [phone]  Fax [phone]

## 2023-07-18 DIAGNOSIS — K5730 Diverticulosis of large intestine without perforation or abscess without bleeding: Secondary | ICD-10-CM | POA: Diagnosis not present

## 2023-07-18 DIAGNOSIS — R160 Hepatomegaly, not elsewhere classified: Secondary | ICD-10-CM | POA: Diagnosis not present

## 2023-07-18 DIAGNOSIS — R3121 Asymptomatic microscopic hematuria: Secondary | ICD-10-CM | POA: Diagnosis not present

## 2023-07-18 DIAGNOSIS — K8020 Calculus of gallbladder without cholecystitis without obstruction: Secondary | ICD-10-CM | POA: Diagnosis not present

## 2023-08-13 DIAGNOSIS — I890 Lymphedema, not elsewhere classified: Secondary | ICD-10-CM | POA: Diagnosis not present

## 2023-08-23 DIAGNOSIS — R8271 Bacteriuria: Secondary | ICD-10-CM | POA: Diagnosis not present

## 2023-08-23 DIAGNOSIS — N200 Calculus of kidney: Secondary | ICD-10-CM | POA: Diagnosis not present

## 2023-09-03 DIAGNOSIS — I890 Lymphedema, not elsewhere classified: Secondary | ICD-10-CM | POA: Diagnosis not present

## 2023-09-05 DIAGNOSIS — I890 Lymphedema, not elsewhere classified: Secondary | ICD-10-CM | POA: Diagnosis not present

## 2023-09-16 DIAGNOSIS — I890 Lymphedema, not elsewhere classified: Secondary | ICD-10-CM | POA: Diagnosis not present

## 2023-10-15 ENCOUNTER — Ambulatory Visit: Payer: Medicare Other | Admitting: Internal Medicine

## 2023-10-22 ENCOUNTER — Ambulatory Visit: Payer: BLUE CROSS/BLUE SHIELD | Admitting: Podiatry

## 2023-10-28 DIAGNOSIS — I890 Lymphedema, not elsewhere classified: Secondary | ICD-10-CM | POA: Diagnosis not present

## 2023-10-31 NOTE — Progress Notes (Signed)
Patient ID: Jennifer Huffman, female   DOB: 1951/08/05, 73 y.o.   MRN: [id number]  HPI: Jennifer Huffman is a 73 y.o.-year-old female, returning for f/u for DM2, dx in 07/2021 (previously prediabetes dx in 2014), controlled uncontrolled, without complications. She moved from [place] in 08/2015. Last visit 6.5 months ago.  Interim history: No increased urination, blurry vision, nausea, chest pain. She lost 9 lbs before last visit and 7 more since then.   She continues to have leg swelling - B legs wrapped.  DM2:  Reviewed HbA1c levels:  07/05/2023: HbA1c 6.4% 04/11/2023: HbA1c 6.2% Lab Results  Component Value Date   HGBA1C 6.5 (H) 01/04/2023   HGBA1C 6.3 (A) 10/11/2022   HGBA1C 6.6 (A) 04/09/2022  04/10/2021: HbA1c 6.5% 05/22/2018: HbA1c 5.8% 10/31/2015: HbA1c 6.5%  Pt is on: - Metformin 500 mg 2x a day started 08/2017.  Denies GI side effects.  She continues to not check sugars at home, despite multiple promptings. She tried a freestyle libre CGM in the past but this irritated her skin so she stopped it.  Glucometer: Contour Next  -No CKD, last BUN/creatinine:  Lab Results  Component Value Date   BUN 15 01/04/2023   Lab Results  Component Value Date   CREATININE 0.89 01/04/2023   On telmisartan.  -+ HL; latest lipids: 07/05/2023: 172/176/51/91 04/13/2022: 173/145/49/98 Lab Results  Component Value Date   CHOL 172 05/26/2020   HDL 50.30 05/26/2020   LDLCALC 98 05/26/2020   TRIG 118.0 05/26/2020   CHOLHDL 3 05/26/2020  On atorvastatin 80.  -Latest eye exam 2024: reportedly No DR; she had cataract surgeries in 02 and 11/2018.  -no numbness and tingling in her feet. Last foot exam 04/2023.  On ASA 81.  Hypothyroidism:  Reviewed her TFTs: 04/13/2022: TSH 1.56 Lab Results  Component Value Date   TSH 3.48 07/19/2021   TSH 3.16 05/26/2020   TSH 3.08 05/26/2019   TSH 3.08 07/04/2017   TSH 1.27 12/21/2016   Pt is on levothyroxine 150 mcg daily,  taken: - in am - fasting - at least 30 min from b'fast - no calcium - no iron - + multivitamins later in the day - no PPIs - not on Biotin  She had a kidney stone removed - 05/08/2018.  She has a history of HTN, lymphedema, OA of her knees, mild intermittent asthma, eczema.  She had R carpal tunnel release surgery in 12/2022.    ROS: + See HPI  I reviewed pt's medications, allergies, PMH, social hx, family hx, and changes were documented in the history of present illness. Otherwise, unchanged from my initial visit note.  Past Medical History:  Diagnosis Date   Asthma, mild intermittent    Blurred vision, bilateral    Diabetes mellitus without complication (HCC)    type 2   Dyspnea    with exertion   History of kidney stones    Hyperlipidemia    Hypertension    Hypothyroidism    Lymphedema of both lower extremities    compressions hose when out anf wraps at home   Neuromuscular disorder (HCC)    carpal tunnel   Osteoarthritis of both knees    Past Surgical History:  Procedure Laterality Date   ABDOMINAL HYSTERECTOMY     partial   CARPAL TUNNEL RELEASE Right 01/07/2023   Procedure: CARPAL TUNNEL RELEASE;  Surgeon: Jodi Geralds, MD;  [facility];  Service: Orthopedics;  Laterality: Right;   COLONOSCOPY WITH PROPOFOL N/A 01/17/2017   Procedure:  COLONOSCOPY WITH PROPOFOL;  Surgeon: Charlott Rakes, MD;  [facility];  Service: Endoscopy;  Laterality: N/A;   COLONOSCOPY WITH PROPOFOL N/A 12/11/2022   Procedure: COLONOSCOPY WITH PROPOFOL;  Surgeon: Charlott Rakes, MD;  [facility];  Service: Gastroenterology;  Laterality: N/A;   CYSTOSCOPY/RETROGRADE/URETEROSCOPY/STONE EXTRACTION WITH BASKET Right 06/30/2018   Procedure: CYSTOSCOPY/RETROGRADE/URETEROSCOPY/STONE EXTRACTION WITH BASKET INSERTION DOUBLE J STENT;  Surgeon: Bjorn Pippin, MD;  [facility];  Service: Urology;  Laterality: Right;   EXTRACORPOREAL SHOCK WAVE LITHOTRIPSY Right  07/31/2018   Procedure: RIGHT EXTRACORPOREAL SHOCK WAVE LITHOTRIPSY (ESWL);  Surgeon: Heloise Purpura, MD;  Location: [facility];  Service: Urology;  Laterality: Right;   EYE SURGERY Bilateral    cataract removal  no lens   HOLMIUM LASER APPLICATION Right 06/30/2018   Procedure: HOLMIUM LASER APPLICATION;  Surgeon: Bjorn Pippin, MD;  [facility];  Service: Urology;  Laterality: Right;   IR ABLATE LIVER CRYOABLATION  02/05/2020   IR ABLATE LIVER CRYOABLATION  03/11/2020   IR RADIOLOGIST EVAL & MGMT  01/21/2020   TUBAL LIGATION     URETEROSCOPY WITH HOLMIUM LASER LITHOTRIPSY Left 05/07/2018   Social History   Social History   Marital Status: Legally Separated    Spouse Name: N/A   Number of Children: 2   Occupational History   retired   Social History Main Topics   Smoking status: Never Smoker    Smokeless tobacco: Never Used   Alcohol Use: Vodka 1-2 drinks socially   Drug Use: No   Current Outpatient Medications on File Prior to Visit  Medication Sig Dispense Refill   albuterol (PROVENTIL HFA;VENTOLIN HFA) 108 (90 Base) MCG/ACT inhaler Inhale 2 puffs into the lungs every 4 (four) hours as needed for wheezing or shortness of breath.     amLODipine (NORVASC) 10 MG tablet Take 10 mg by mouth in the morning.     atorvastatin (LIPITOR) 20 MG tablet TAKE 1 TABLET BY MOUTH EVERY DAY 90 tablet 1   BAYER MICROLET LANCETS lancets Use as instructed used to check one time a day 100 each 5   chlorthalidone (HYGROTON) 25 MG tablet Take 25 mg by mouth in the morning.     Cholecalciferol (VITAMIN D-3) 25 MCG (1000 UT) CAPS Take 1,000 Units by mouth in the morning.     HYDROcodone-acetaminophen (NORCO/VICODIN) 5-325 MG tablet Take 1 tablet by mouth every 6 (six) hours as needed for moderate pain (for post op pain). 20 tablet 0   ketoconazole (NIZORAL) 2 % cream Apply 1 Application topically daily. Apply 1gm to skin on bottom and sides of feet daily 60 g 2   levothyroxine (SYNTHROID) 150 MCG tablet  Take 1 tablet (150 mcg total) by mouth daily before breakfast. 90 tablet 3   metFORMIN (GLUCOPHAGE) 500 MG tablet Take 1 tablet (500 mg total) by mouth 2 (two) times daily with a meal. 180 tablet 3   Multiple Vitamins-Minerals (MULTIVITAMIN WITH MINERALS) tablet Take 1 tablet by mouth daily. Centrum Silver     potassium chloride (KLOR-CON) 10 MEQ tablet Take 10 mEq by mouth 2 (two) times daily.     propranolol (INDERAL) 10 MG tablet Take 10 mg by mouth in the morning.     telmisartan (MICARDIS) 80 MG tablet Take 80 mg by mouth in the morning.     No current facility-administered medications on file prior to visit.   No Known Allergies Family History  Problem Relation Age of Onset   Cancer Mother  breast   Breast cancer Mother 28   Hypertension Father    Breast cancer Sister 65   Breast cancer Sister 35   PE: BP 118/64   Pulse 85   Ht 5\' 1"  (1.549 m)   Wt 299 lb 6.4 oz (135.8 kg)   SpO2 95%   BMI 56.57 kg/m  Wt Readings from Last 3 Encounters:  11/01/23 299 lb 6.4 oz (135.8 kg)  04/11/23 (!) 306 lb 9.6 oz (139.1 kg)  01/07/23 (!) 310 lb (140.6 kg)   Constitutional: obese, in NAD, walks with a walker Eyes: EOMI, no exophthalmos ENT: no thyromegaly, no cervical lymphadenopathy Cardiovascular: RRR, No MRG + bilateral significant lymphedema in LE R>L Respiratory: CTA B Musculoskeletal: no deformities Skin: no rashes Neurological: + Slight tremor with outstretched hands  ASSESSMENT: 1.  Diabetes type 2  2. Hypothyroidism  3.  HL  PLAN:  1. Patient with controlled diabetes, on low-dose metformin.  She has no side effects from metformin.  Over the years, I was not able to convince her to start checking blood sugars despite repeated explanation of why this was necessary.  Therefore, we are treating her based on the HbA1c, which is not ideal.  At last visit, HbA1c was 6.2%, lower.  I did recommend a CGM in the past but she declined. -At today's visit, she is not checking  blood sugars.  An HbA1c was 6.4% 4 months ago and it is slightly higher today.  This could be related to the holidays and dietary indiscretions.  We will continue the same regimen for now, but may need to intensify the treatment at next visit if HbA1c continues to increase. - I suggested to:  Patient Instructions  Please continue: - Metformin 500 mg 2x a day with meals.   Try to check sugars every day or every other day.   Continue levothyroxine 150 mcg daily.   Take the thyroid hormone every day, with water, at least 30 minutes before breakfast, separated by at least 4 hours from: - acid reflux medications - calcium - iron - multivitamins              Please return in 6 months with your sugar log.   - we checked her HbA1c: 6.6% (higher) - advised to check sugars at different times of the day - 1x a day, rotating check times - advised for yearly eye exams >> she is UTD - return to clinic in 6 months  2. Hypothyroidism - latest thyroid labs reviewed with pt. >> normal in 03/2022 - she continues on LT4 150 mcg daily - pt feels good on this dose. - we discussed about taking the thyroid hormone every day, with water, >30 minutes before breakfast, separated by >4 hours from acid reflux medications, calcium, iron, multivitamins. Pt. is taking it correctly. - will check thyroid tests today: TSH and fT4 - If labs are abnormal, she will need to return for repeat TFTs in 1.5 months   3.  HL -Latest lipid panel was reviewed from 07/2023:  fractions at goal.  LDL is in the 90s, but she does not have diabetic complications so it is at target. -Will continue Lipitor 80 mg daily-no side effects  Needs LT4 refills.  Orders Placed This Encounter  Procedures   TSH   T4, free   POCT glycosylated hemoglobin (Hb A1C)   Carlus Pavlov, MD PhD [facility] Endocrinology

## 2023-11-01 ENCOUNTER — Ambulatory Visit (INDEPENDENT_AMBULATORY_CARE_PROVIDER_SITE_OTHER): Payer: Medicare Other | Admitting: Internal Medicine

## 2023-11-01 ENCOUNTER — Encounter: Payer: Self-pay | Admitting: Internal Medicine

## 2023-11-01 VITALS — BP 118/64 | HR 85 | Ht 61.0 in | Wt 299.4 lb

## 2023-11-01 DIAGNOSIS — Z7984 Long term (current) use of oral hypoglycemic drugs: Secondary | ICD-10-CM

## 2023-11-01 DIAGNOSIS — E039 Hypothyroidism, unspecified: Secondary | ICD-10-CM

## 2023-11-01 DIAGNOSIS — E785 Hyperlipidemia, unspecified: Secondary | ICD-10-CM

## 2023-11-01 DIAGNOSIS — E119 Type 2 diabetes mellitus without complications: Secondary | ICD-10-CM | POA: Diagnosis not present

## 2023-11-01 LAB — POCT GLYCOSYLATED HEMOGLOBIN (HGB A1C): Hemoglobin A1C: 6.6 % — AB (ref 4.0–5.6)

## 2023-11-01 NOTE — Patient Instructions (Signed)
Please continue: - Metformin 500 mg 2x a day with meals.   Try to check sugars every day or every other day.   Continue levothyroxine 150 mcg daily.   Take the thyroid hormone every day, with water, at least 30 minutes before breakfast, separated by at least 4 hours from: - acid reflux medications - calcium - iron - multivitamins              Please return in 6 months with your sugar log.

## 2023-11-02 LAB — T4, FREE: Free T4: 1.4 ng/dL (ref 0.8–1.8)

## 2023-11-02 LAB — TSH: TSH: 1.26 m[IU]/L (ref 0.40–4.50)

## 2023-11-05 MED ORDER — LEVOTHYROXINE SODIUM 150 MCG PO TABS: 150.0000 ug | ORAL_TABLET | Freq: Every day | ORAL | 3 refills | Status: AC

## 2023-11-05 NOTE — Addendum Note (Signed)
Addended by: Carlus Pavlov on: 11/05/2023 11:15 AM   Modules accepted: Orders

## 2023-11-11 ENCOUNTER — Ambulatory Visit: Payer: BLUE CROSS/BLUE SHIELD | Admitting: Podiatry

## 2023-11-25 ENCOUNTER — Ambulatory Visit (INDEPENDENT_AMBULATORY_CARE_PROVIDER_SITE_OTHER): Payer: BLUE CROSS/BLUE SHIELD | Admitting: Podiatry

## 2023-11-25 VITALS — Ht 61.0 in | Wt 308.0 lb

## 2023-11-25 DIAGNOSIS — M79674 Pain in right toe(s): Secondary | ICD-10-CM | POA: Diagnosis not present

## 2023-11-25 DIAGNOSIS — B351 Tinea unguium: Secondary | ICD-10-CM | POA: Diagnosis not present

## 2023-11-25 DIAGNOSIS — M79675 Pain in left toe(s): Secondary | ICD-10-CM | POA: Diagnosis not present

## 2023-11-25 NOTE — Progress Notes (Unsigned)
    Subjective:  Patient ID: Jennifer Huffman, female    DOB: May 29, 1951,  MRN: [id number]  Jennifer Huffman presents to clinic today for:  Chief Complaint  Patient presents with   RFC    " I am here for a nail trim today"   Patient notes nails are thick and elongated, causing pain in shoe gear when ambulating.  She uses a walker to assist with ambulation.    PCP is Lorenda Ishihara, MD.  Past Medical History:  Diagnosis Date   Asthma, mild intermittent    Blurred vision, bilateral    Diabetes mellitus without complication (HCC)    type 2   Dyspnea    with exertion   History of kidney stones    Hyperlipidemia    Hypertension    Hypothyroidism    Lymphedema of both lower extremities    compressions hose when out anf wraps at home   Neuromuscular disorder (HCC)    carpal tunnel   Osteoarthritis of both knees    No Known Allergies  Objective:  Jennifer Huffman is a pleasant 73 y.o. female in NAD. AAO x 3.  Vascular Examination: Patient has palpable DP pulse, absent PT pulse bilateral.  Delayed capillary refill bilateral toes.  Sparse digital hair bilateral.  Proximal to distal cooling WNL bilateral.  +2 pitting edema bilateral lower legs and ankles.  Dermatological Examination: Interspaces are clear with no open lesions noted bilateral.  Skin is shiny and atrophic bilateral.  Nails are 3-39mm thick, with yellowish/brown discoloration, subungual debris and distal onycholysis x10.  There is pain with compression of nails x10.    Neurological Examination: Protective sensation intact b/l LE. Patient has a wide base of gait.     Latest Ref Rng & Units 11/01/2023   11:35 AM 04/11/2023   12:00 AM 01/04/2023   11:01 AM  Hemoglobin A1C  Hemoglobin-A1c 4.0 - 5.6 % 6.6  6.2     6.5      This result is from an external source.   Patient qualifies for at-risk foot care because of diabetes with PVD.  Assessment/Plan: 1. Pain due to onychomycosis of toenails of  both feet    Mycotic nails x10 were sharply debrided with sterile nail nippers and power debriding burr to decrease bulk and length.  Patient did not tolerate the bur very well today, but this may be due to increased vibration on this particular device in her exam room today.   Return in about 3 months (around 02/22/2024) for [facility].   Clerance Lav, DPM, FACFAS [facility]     [address] [place], [place] [address]                Office [phone]  Fax [phone]

## 2024-01-29 DIAGNOSIS — E785 Hyperlipidemia, unspecified: Secondary | ICD-10-CM | POA: Diagnosis not present

## 2024-01-29 DIAGNOSIS — E1169 Type 2 diabetes mellitus with other specified complication: Secondary | ICD-10-CM | POA: Diagnosis not present

## 2024-01-29 DIAGNOSIS — E039 Hypothyroidism, unspecified: Secondary | ICD-10-CM | POA: Diagnosis not present

## 2024-02-05 ENCOUNTER — Other Ambulatory Visit: Payer: Self-pay | Admitting: Internal Medicine

## 2024-02-05 DIAGNOSIS — I890 Lymphedema, not elsewhere classified: Secondary | ICD-10-CM | POA: Diagnosis not present

## 2024-02-05 DIAGNOSIS — E2839 Other primary ovarian failure: Secondary | ICD-10-CM | POA: Diagnosis not present

## 2024-02-05 DIAGNOSIS — Z1231 Encounter for screening mammogram for malignant neoplasm of breast: Secondary | ICD-10-CM

## 2024-02-05 DIAGNOSIS — E1169 Type 2 diabetes mellitus with other specified complication: Secondary | ICD-10-CM | POA: Diagnosis not present

## 2024-02-05 DIAGNOSIS — I10 Essential (primary) hypertension: Secondary | ICD-10-CM | POA: Diagnosis not present

## 2024-02-11 ENCOUNTER — Ambulatory Visit

## 2024-02-11 DIAGNOSIS — I10 Essential (primary) hypertension: Secondary | ICD-10-CM | POA: Diagnosis not present

## 2024-02-11 DIAGNOSIS — E1169 Type 2 diabetes mellitus with other specified complication: Secondary | ICD-10-CM | POA: Diagnosis not present

## 2024-02-25 ENCOUNTER — Ambulatory Visit

## 2024-02-25 ENCOUNTER — Ambulatory Visit: Payer: BLUE CROSS/BLUE SHIELD | Admitting: Podiatry

## 2024-02-26 ENCOUNTER — Ambulatory Visit

## 2024-02-29 DIAGNOSIS — E1169 Type 2 diabetes mellitus with other specified complication: Secondary | ICD-10-CM | POA: Diagnosis not present

## 2024-02-29 DIAGNOSIS — I10 Essential (primary) hypertension: Secondary | ICD-10-CM | POA: Diagnosis not present

## 2024-02-29 DIAGNOSIS — E039 Hypothyroidism, unspecified: Secondary | ICD-10-CM | POA: Diagnosis not present

## 2024-02-29 DIAGNOSIS — E785 Hyperlipidemia, unspecified: Secondary | ICD-10-CM | POA: Diagnosis not present

## 2024-03-04 ENCOUNTER — Ambulatory Visit

## 2024-03-05 ENCOUNTER — Ambulatory Visit
Admission: RE | Admit: 2024-03-05 | Discharge: 2024-03-05 | Disposition: A | Discharge status: Home / Self Care | Source: Ambulatory Visit | Attending: Internal Medicine | Admitting: Internal Medicine

## 2024-03-05 ENCOUNTER — Other Ambulatory Visit (HOSPITAL_BASED_OUTPATIENT_CLINIC_OR_DEPARTMENT_OTHER)

## 2024-03-05 DIAGNOSIS — Z1231 Encounter for screening mammogram for malignant neoplasm of breast: Secondary | ICD-10-CM | POA: Diagnosis not present

## 2024-03-10 ENCOUNTER — Ambulatory Visit (HOSPITAL_BASED_OUTPATIENT_CLINIC_OR_DEPARTMENT_OTHER)
Admission: RE | Admit: 2024-03-10 | Discharge: 2024-03-10 | Disposition: A | Discharge status: Home / Self Care | Source: Ambulatory Visit | Attending: Internal Medicine | Admitting: Internal Medicine

## 2024-03-10 DIAGNOSIS — E2839 Other primary ovarian failure: Secondary | ICD-10-CM | POA: Diagnosis not present

## 2024-03-10 DIAGNOSIS — Z780 Asymptomatic menopausal state: Secondary | ICD-10-CM | POA: Diagnosis not present

## 2024-03-10 DIAGNOSIS — M85852 Other specified disorders of bone density and structure, left thigh: Secondary | ICD-10-CM | POA: Diagnosis not present

## 2024-03-11 DIAGNOSIS — I10 Essential (primary) hypertension: Secondary | ICD-10-CM | POA: Diagnosis not present

## 2024-03-11 DIAGNOSIS — E1169 Type 2 diabetes mellitus with other specified complication: Secondary | ICD-10-CM | POA: Diagnosis not present

## 2024-03-23 ENCOUNTER — Ambulatory Visit: Admitting: Podiatry

## 2024-04-10 DIAGNOSIS — I10 Essential (primary) hypertension: Secondary | ICD-10-CM | POA: Diagnosis not present

## 2024-04-10 DIAGNOSIS — E1169 Type 2 diabetes mellitus with other specified complication: Secondary | ICD-10-CM | POA: Diagnosis not present

## 2024-04-20 ENCOUNTER — Ambulatory Visit (INDEPENDENT_AMBULATORY_CARE_PROVIDER_SITE_OTHER): Admitting: Podiatry

## 2024-04-20 DIAGNOSIS — M79675 Pain in left toe(s): Secondary | ICD-10-CM | POA: Diagnosis not present

## 2024-04-20 DIAGNOSIS — B351 Tinea unguium: Secondary | ICD-10-CM | POA: Diagnosis not present

## 2024-04-20 DIAGNOSIS — M79674 Pain in right toe(s): Secondary | ICD-10-CM

## 2024-04-20 NOTE — Progress Notes (Signed)
 Subjective:  Patient ID: Jennifer Huffman, female    DOB: 1951/07/13,  MRN: [id number]  Jennifer Huffman presents to clinic today for:  Chief Complaint  Patient presents with   [facility]    Last A1c: 6.6. no anticoag.    Patient notes nails are thick, discolored, elongated and painful in shoegear when trying to ambulate.    PCP is Elliot Charm, MD.  Past Medical History:  Diagnosis Date   Asthma, mild intermittent    Blurred vision, bilateral    Diabetes mellitus without complication (HCC)    type 2   Dyspnea    with exertion   History of kidney stones    Hyperlipidemia    Hypertension    Hypothyroidism    Lymphedema of both lower extremities    compressions hose when out anf wraps at home   Neuromuscular disorder (HCC)    carpal tunnel   Osteoarthritis of both knees    Past Surgical History:  Procedure Laterality Date   ABDOMINAL HYSTERECTOMY     partial   CARPAL TUNNEL RELEASE Right 01/07/2023   Procedure: CARPAL TUNNEL RELEASE;  Surgeon: Yvone Rush, MD;  Location: [facility];  Service: Orthopedics;  Laterality: Right;   COLONOSCOPY WITH PROPOFOL  N/A 01/17/2017   Procedure: COLONOSCOPY WITH PROPOFOL ;  Surgeon: Jerrell Sol, MD;  [facility]: [facility];  Service: Endoscopy;  Laterality: N/A;   COLONOSCOPY WITH PROPOFOL  N/A 12/11/2022   Procedure: COLONOSCOPY WITH PROPOFOL ;  Surgeon: Sol Jerrell, MD;  Location: [facility];  Service: Gastroenterology;  Laterality: N/A;   CYSTOSCOPY/RETROGRADE/URETEROSCOPY/STONE EXTRACTION WITH BASKET Right 06/30/2018   Procedure: CYSTOSCOPY/RETROGRADE/URETEROSCOPY/STONE EXTRACTION WITH BASKET INSERTION DOUBLE J STENT;  Surgeon: Watt Rush, MD;  Location: [facility];  Service: Urology;  Laterality: Right;   EXTRACORPOREAL SHOCK WAVE LITHOTRIPSY Right 07/31/2018   Procedure: RIGHT EXTRACORPOREAL SHOCK WAVE LITHOTRIPSY (ESWL);  Surgeon: Renda Glance, MD;  Location: [facility];  Service: Urology;  Laterality: Right;   EYE  SURGERY Bilateral    cataract removal  no lens   HOLMIUM LASER APPLICATION Right 06/30/2018   Procedure: HOLMIUM LASER APPLICATION;  Surgeon: Watt Rush, MD;  [facility]: [facility];  Service: Urology;  Laterality: Right;   IR ABLATE LIVER CRYOABLATION  02/05/2020   IR ABLATE LIVER CRYOABLATION  03/11/2020   IR RADIOLOGIST EVAL & MGMT  01/21/2020   TUBAL LIGATION     URETEROSCOPY WITH HOLMIUM LASER LITHOTRIPSY Left 05/07/2018   No Known Allergies  Review of Systems: Negative except as noted in the HPI.  Objective: Jennifer Huffman is a pleasant 73 y.o. female in NAD. AAO x 3.  Vascular Examination: Capillary refill time is 3-5 seconds to toes bilateral. Trace palpable pedal pulses b/l LE. Digital hair sparse b/l.  Skin temperature gradient WNL b/l. +2 pitting edema bilateral  Dermatological Examination: Pedal skin with normal turgor, texture and tone b/l. No open wounds. No interdigital macerations b/l. Toenails x10 are 3mm thick, discolored, dystrophic with subungual debris. There is pain with compression of the nail plates.  They are elongated x10     Latest Ref Rng & Units 11/01/2023   11:35 AM  Hemoglobin A1C  Hemoglobin-A1c 4.0 - 5.6 % 6.6    Assessment/Plan: 1. Pain due to onychomycosis of toenails of both feet     The mycotic toenails were sharply debrided x10 with sterile nail nippers and a power debriding burr to decrease bulk/thickness and length.    Return in about 3 months (around 07/21/2024) for [name].   Galit Urich D.  Landy Mace, DPM, FACFAS [facility]     [address] [place], [place] [address]                Office [phone]  Fax [phone]

## 2024-04-30 ENCOUNTER — Encounter: Payer: Self-pay | Admitting: Internal Medicine

## 2024-04-30 ENCOUNTER — Ambulatory Visit: Payer: Medicare Other | Admitting: Internal Medicine

## 2024-04-30 VITALS — BP 120/74 | HR 71 | Ht 61.0 in | Wt 288.0 lb

## 2024-04-30 DIAGNOSIS — E039 Hypothyroidism, unspecified: Secondary | ICD-10-CM | POA: Diagnosis not present

## 2024-04-30 DIAGNOSIS — Z7984 Long term (current) use of oral hypoglycemic drugs: Secondary | ICD-10-CM

## 2024-04-30 DIAGNOSIS — I10 Essential (primary) hypertension: Secondary | ICD-10-CM | POA: Diagnosis not present

## 2024-04-30 DIAGNOSIS — E785 Hyperlipidemia, unspecified: Secondary | ICD-10-CM | POA: Diagnosis not present

## 2024-04-30 DIAGNOSIS — E119 Type 2 diabetes mellitus without complications: Secondary | ICD-10-CM | POA: Diagnosis not present

## 2024-04-30 DIAGNOSIS — E1169 Type 2 diabetes mellitus with other specified complication: Secondary | ICD-10-CM | POA: Diagnosis not present

## 2024-04-30 LAB — POCT GLYCOSYLATED HEMOGLOBIN (HGB A1C): Hemoglobin A1C: 6.4 % — AB (ref 4.0–5.6)

## 2024-04-30 MED ORDER — METFORMIN HCL 500 MG PO TABS: 500.0000 mg | ORAL_TABLET | Freq: Two times a day (BID) | ORAL | 3 refills | Status: AC

## 2024-04-30 NOTE — Progress Notes (Signed)
 Patient ID: Jennifer Huffman, female   DOB: 1950/10/04, 73 y.o.   MRN: [id number]  HPI: Jennifer Huffman is a 73 y.o.-year-old female, returning for f/u for DM2, dx in 07/2021 (previously prediabetes dx in 2014), controlled, without complications. She moved from [place]  in 08/2015. Last visit 6 months ago.  Interim history: No increased urination, blurry vision, nausea, chest pain.  She continues to have leg swelling - B legs wrapped. Her partner for 40 years passed away in 2023/12/04 >> she moved in with her sister in [place], [place] (45 min away).  She was previously cooking for her, so she is eating less now-lost 11 pounds since last visit.  DM2:  Reviewed HbA1c levels:  Lab Results  Component Value Date   HGBA1C 6.6 (A) 11/01/2023   HGBA1C 6.2 04/11/2023   HGBA1C 6.5 (H) 01/04/2023  07/05/2023: HbA1c 6.4% 04/11/2023: HbA1c 6.2% 04/10/2021: HbA1c 6.5% 05/22/2018: HbA1c 5.8% 10/31/2015: HbA1c 6.5%  Pt is on: - Metformin  500 mg 2x a day started 08/2017.  Denies GI side effects.  She continues to not check sugars at home, despite multiple promptings. She tried a freestyle libre CGM in the past but this irritated her skin so she stopped it.  Glucometer: Contour Next  -No CKD, last BUN/creatinine:  07/05/2023: 16/0.7, GFR 87.8 Lab Results  Component Value Date   BUN 15 01/04/2023   Lab Results  Component Value Date   CREATININE 0.89 01/04/2023   On telmisartan.  -+ HL; latest lipids: 07/05/2023: 172/176/51/91 04/13/2022: 173/145/49/98 Lab Results  Component Value Date   CHOL 172 05/26/2020   HDL 50.30 05/26/2020   LDLCALC 98 05/26/2020   TRIG 118.0 05/26/2020   CHOLHDL 3 05/26/2020  On atorvastatin  20 mg daily.  -Latest eye exam 2025: reportedly No DR; she had cataract surgeries in 02 and 12/04/18.  -no numbness and tingling in her feet. She sees podiatry. Last foot exam 04/20/2024 by Dr. Loel.  On ASA 81.  Hypothyroidism:  Reviewed her TFTs: Lab  Results  Component Value Date   TSH 1.26 11/01/2023   TSH 3.48 07/19/2021   TSH 3.16 05/26/2020   TSH 3.08 05/26/2019   TSH 3.08 07/04/2017   TSH 1.27 12/21/2016  04/13/2022: TSH 1.56  Pt is on levothyroxine  150 mcg daily, taken: - in am - fasting - at least 30 min from b'fast - no calcium  - no iron - + multivitamins later in the day - no PPIs - not on Biotin  She had a kidney stone removed - 05/08/2018.  She has a history of HTN, lymphedema, OA of her knees, mild intermittent asthma, eczema.  She had R carpal tunnel release surgery in 12/2022.    ROS: + See HPI  I reviewed pt's medications, allergies, PMH, social hx, family hx, and changes were documented in the history of present illness. Otherwise, unchanged from my initial visit note.  Past Medical History:  Diagnosis Date   Asthma, mild intermittent    Blurred vision, bilateral    Diabetes mellitus without complication (HCC)    type 2   Dyspnea    with exertion   History of kidney stones    Hyperlipidemia    Hypertension    Hypothyroidism    Lymphedema of both lower extremities    compressions hose when out anf wraps at home   Neuromuscular disorder (HCC)    carpal tunnel   Osteoarthritis of both knees    Past Surgical History:  Procedure Laterality Date   ABDOMINAL HYSTERECTOMY  partial   CARPAL TUNNEL RELEASE Right 01/07/2023   Procedure: CARPAL TUNNEL RELEASE;  Surgeon: Yvone Rush, MD;  [facility];  Service: Orthopedics;  Laterality: Right;   COLONOSCOPY WITH PROPOFOL  N/A 01/17/2017   Procedure: COLONOSCOPY WITH PROPOFOL ;  Surgeon: Jerrell Sol, MD;  [facility];  Service: Endoscopy;  Laterality: N/A;   COLONOSCOPY WITH PROPOFOL  N/A 12/11/2022   Procedure: COLONOSCOPY WITH PROPOFOL ;  Surgeon: Sol Jerrell, MD;  [facility];  Service: Gastroenterology;  Laterality: N/A;   CYSTOSCOPY/RETROGRADE/URETEROSCOPY/STONE EXTRACTION WITH BASKET Right 06/30/2018   Procedure:  CYSTOSCOPY/RETROGRADE/URETEROSCOPY/STONE EXTRACTION WITH BASKET INSERTION DOUBLE J STENT;  Surgeon: Watt Rush, MD;  [facility];  Service: Urology;  Laterality: Right;   EXTRACORPOREAL SHOCK WAVE LITHOTRIPSY Right 07/31/2018   Procedure: RIGHT EXTRACORPOREAL SHOCK WAVE LITHOTRIPSY (ESWL);  Surgeon: Renda Glance, MD;  [facility];  Service: Urology;  Laterality: Right;   EYE SURGERY Bilateral    cataract removal  no lens   HOLMIUM LASER APPLICATION Right 06/30/2018   Procedure: HOLMIUM LASER APPLICATION;  Surgeon: Watt Rush, MD;  [facility];  Service: Urology;  Laterality: Right;   IR ABLATE LIVER CRYOABLATION  02/05/2020   IR ABLATE LIVER CRYOABLATION  03/11/2020   IR RADIOLOGIST EVAL & MGMT  01/21/2020   TUBAL LIGATION     URETEROSCOPY WITH HOLMIUM LASER LITHOTRIPSY Left 05/07/2018   Social History   Social History   Marital Status: Legally Separated    Spouse Name: N/A   Number of Children: 2   Occupational History   retired   Social History Main Topics   Smoking status: Never Smoker    Smokeless tobacco: Never Used   Alcohol Use: Vodka 1-2 drinks socially   Drug Use: No   Current Outpatient Medications on File Prior to Visit  Medication Sig Dispense Refill   albuterol  (PROVENTIL  HFA;VENTOLIN  HFA) 108 (90 Base) MCG/ACT inhaler Inhale 2 puffs into the lungs every 4 (four) hours as needed for wheezing or shortness of breath.     amLODipine  (NORVASC ) 10 MG tablet Take 10 mg by mouth in the morning.     atorvastatin  (LIPITOR) 20 MG tablet TAKE 1 TABLET BY MOUTH EVERY DAY 90 tablet 1   BAYER MICROLET LANCETS lancets Use as instructed used to check one time a day 100 each 5   chlorthalidone (HYGROTON) 25 MG tablet Take 25 mg by mouth in the morning.     Cholecalciferol (VITAMIN D-3) 25 MCG (1000 UT) CAPS Take 1,000 Units by mouth in the morning.     ketoconazole  (NIZORAL ) 2 % cream Apply 1 Application topically daily. Apply 1gm to skin on bottom and sides of  feet daily 60 g 2   levothyroxine  (SYNTHROID ) 150 MCG tablet Take 1 tablet (150 mcg total) by mouth daily before breakfast. 90 tablet 3   metFORMIN  (GLUCOPHAGE ) 500 MG tablet Take 1 tablet (500 mg total) by mouth 2 (two) times daily with a meal. 180 tablet 3   Multiple Vitamins-Minerals (MULTIVITAMIN WITH MINERALS) tablet Take 1 tablet by mouth daily. Centrum Silver     potassium chloride  (KLOR-CON ) 10 MEQ tablet Take 10 mEq by mouth 2 (two) times daily.     propranolol (INDERAL) 10 MG tablet Take 10 mg by mouth in the morning.     telmisartan (MICARDIS) 80 MG tablet Take 80 mg by mouth in the morning.     No current facility-administered medications on file prior to visit.   No Known Allergies Family History  Problem Relation Age  of Onset   Cancer Mother        breast   Breast cancer Mother 37   Hypertension Father    Breast cancer Sister 55   Breast cancer Sister 4   PE: BP 120/74 (BP Location: Right Wrist, Patient Position: Sitting, Cuff Size: Large)   Pulse 71   Ht 5' 1 (1.549 m)   Wt 288 lb (130.6 kg)   SpO2 95%   BMI 54.42 kg/m  Wt Readings from Last 3 Encounters:  04/30/24 288 lb (130.6 kg)  11/25/23 (!) 308 lb (139.7 kg)  11/01/23 299 lb 6.4 oz (135.8 kg)   Constitutional: obese, in NAD, walks with a walker Eyes: EOMI, no exophthalmos ENT: no thyromegaly, no cervical lymphadenopathy Cardiovascular: RRR, No MRG + bilateral significant lymphedema in LE R>L Respiratory: CTA B Musculoskeletal: no deformities Skin: no rashes Neurological: + Slight tremor with outstretched hands  ASSESSMENT: 1.  Diabetes type 2  2. Hypothyroidism  3.  HL  PLAN:  1. Patient With well-controlled diabetes on low-dose metformin  only.  Over the years, I have not been able to convince him to start checking blood sugars despite repeated explanation of why this was necessary.  Therefore, we are not treating her based on the HbA1c rather than blood sugars, which is not ideal.  At last  visit, HbA1c was slightly higher, at 6.6%, possibly due to the holidays and dietary indiscretions.  We did not change her regimen at that time. -At today's visit, she is still not checking blood sugars.  However, the HbA1c is improved, so for now we will continue the same dose of metformin .  I refilled this for her. - I suggested to:  Patient Instructions  Please continue: - Metformin  500 mg 2x a day with meals.   Try to check sugars every day or every other day.   Continue levothyroxine  150 mcg daily.   Take the thyroid  hormone every day, with water, at least 30 minutes before breakfast, separated by at least 4 hours from: - acid reflux medications - calcium  - iron - multivitamins              Please return in 6 months with your sugar log.   - we checked her HbA1c: 6.4% (lower) - advised to check sugars at different times of the day - 1x a day, rotating check times - advised for yearly eye exams >> she is UTD - return to clinic in 6 months  2. Hypothyroidism - latest thyroid  labs reviewed with pt. >> normal: Lab Results  Component Value Date   TSH 1.26 11/01/2023  - she continues on LT4 150 mcg daily - pt feels good on this dose. - we discussed about taking the thyroid  hormone every day, with water, >30 minutes before breakfast, separated by >4 hours from acid reflux medications, calcium , iron, multivitamins. Pt. is taking it correctly. - will check thyroid  tests at next OV  3.  HL - Reviewed latest lipid panel from 07/2023: Fractions at goal.  Her LDL is in the 90s, but without diabetic complications, this would be at target.  - Will continue Lipitor 80 mg daily.  No side effects.  Lela Fendt, MD PhD [facility] Endocrinology

## 2024-04-30 NOTE — Addendum Note (Signed)
 Addended by: CLEOTILDE ROLIN RAMAN on: 04/30/2024 02:01 PM   Modules accepted: Orders

## 2024-04-30 NOTE — Patient Instructions (Signed)
Please continue: - Metformin 500 mg 2x a day with meals.   Try to check sugars every day or every other day.   Continue levothyroxine 150 mcg daily.   Take the thyroid hormone every day, with water, at least 30 minutes before breakfast, separated by at least 4 hours from: - acid reflux medications - calcium - iron - multivitamins              Please return in 6 months with your sugar log.

## 2024-05-02 DIAGNOSIS — R1030 Lower abdominal pain, unspecified: Secondary | ICD-10-CM | POA: Diagnosis not present

## 2024-05-02 DIAGNOSIS — K5730 Diverticulosis of large intestine without perforation or abscess without bleeding: Secondary | ICD-10-CM | POA: Diagnosis not present

## 2024-05-02 DIAGNOSIS — R1032 Left lower quadrant pain: Secondary | ICD-10-CM | POA: Diagnosis not present

## 2024-05-10 DIAGNOSIS — I10 Essential (primary) hypertension: Secondary | ICD-10-CM | POA: Diagnosis not present

## 2024-05-10 DIAGNOSIS — E1169 Type 2 diabetes mellitus with other specified complication: Secondary | ICD-10-CM | POA: Diagnosis not present

## 2024-05-26 ENCOUNTER — Telehealth: Payer: Self-pay | Admitting: Lab

## 2024-05-26 NOTE — Telephone Encounter (Signed)
 Patient states needs cream refilled unsure of the name advised to have pharmacy sent refill request.

## 2024-05-27 ENCOUNTER — Other Ambulatory Visit: Payer: Self-pay | Admitting: Podiatry

## 2024-05-27 MED ORDER — KETOCONAZOLE 2 % EX CREA: 1.0000 | TOPICAL_CREAM | Freq: Every day | CUTANEOUS | 2 refills | Status: AC

## 2024-05-27 NOTE — Progress Notes (Signed)
 Patient requested a refill on her ketoconazole  cream.  It was sent in.

## 2024-05-31 DIAGNOSIS — E1169 Type 2 diabetes mellitus with other specified complication: Secondary | ICD-10-CM | POA: Diagnosis not present

## 2024-05-31 DIAGNOSIS — I10 Essential (primary) hypertension: Secondary | ICD-10-CM | POA: Diagnosis not present

## 2024-05-31 DIAGNOSIS — E785 Hyperlipidemia, unspecified: Secondary | ICD-10-CM | POA: Diagnosis not present

## 2024-05-31 DIAGNOSIS — E039 Hypothyroidism, unspecified: Secondary | ICD-10-CM | POA: Diagnosis not present

## 2024-06-09 DIAGNOSIS — I10 Essential (primary) hypertension: Secondary | ICD-10-CM | POA: Diagnosis not present

## 2024-06-09 DIAGNOSIS — E1169 Type 2 diabetes mellitus with other specified complication: Secondary | ICD-10-CM | POA: Diagnosis not present

## 2024-06-30 DIAGNOSIS — E1169 Type 2 diabetes mellitus with other specified complication: Secondary | ICD-10-CM | POA: Diagnosis not present

## 2024-06-30 DIAGNOSIS — E785 Hyperlipidemia, unspecified: Secondary | ICD-10-CM | POA: Diagnosis not present

## 2024-06-30 DIAGNOSIS — E039 Hypothyroidism, unspecified: Secondary | ICD-10-CM | POA: Diagnosis not present

## 2024-10-26 ENCOUNTER — Other Ambulatory Visit

## 2024-11-02 ENCOUNTER — Ambulatory Visit: Admitting: Internal Medicine
